# Patient Record
Sex: Male | Born: 1955 | Race: White | Hispanic: No | Marital: Married | Smoking: Never smoker
Health system: Southern US, Community
[De-identification: ages and names within clinical notes are randomized; demographics above are authoritative.]

## PROBLEM LIST (undated history)

## (undated) DIAGNOSIS — M51379 Other intervertebral disc degeneration, lumbosacral region without mention of lumbar back pain or lower extremity pain: Secondary | ICD-10-CM

## (undated) DIAGNOSIS — K219 Gastro-esophageal reflux disease without esophagitis: Secondary | ICD-10-CM

## (undated) DIAGNOSIS — K648 Other hemorrhoids: Secondary | ICD-10-CM

## (undated) DIAGNOSIS — K5792 Diverticulitis of intestine, part unspecified, without perforation or abscess without bleeding: Secondary | ICD-10-CM

## (undated) DIAGNOSIS — N400 Enlarged prostate without lower urinary tract symptoms: Secondary | ICD-10-CM

## (undated) DIAGNOSIS — E785 Hyperlipidemia, unspecified: Secondary | ICD-10-CM

## (undated) DIAGNOSIS — Z8679 Personal history of other diseases of the circulatory system: Secondary | ICD-10-CM

## (undated) DIAGNOSIS — D126 Benign neoplasm of colon, unspecified: Secondary | ICD-10-CM

## (undated) DIAGNOSIS — M5137 Other intervertebral disc degeneration, lumbosacral region: Secondary | ICD-10-CM

## (undated) DIAGNOSIS — I10 Essential (primary) hypertension: Secondary | ICD-10-CM

## (undated) DIAGNOSIS — E559 Vitamin D deficiency, unspecified: Secondary | ICD-10-CM

## (undated) DIAGNOSIS — K4020 Bilateral inguinal hernia, without obstruction or gangrene, not specified as recurrent: Secondary | ICD-10-CM

## (undated) HISTORY — DX: Hyperlipidemia, unspecified: E78.5

## (undated) HISTORY — DX: Other intervertebral disc degeneration, lumbosacral region: M51.37

## (undated) HISTORY — DX: Gastro-esophageal reflux disease without esophagitis: K21.9

## (undated) HISTORY — DX: Other intervertebral disc degeneration, lumbosacral region without mention of lumbar back pain or lower extremity pain: M51.379

## (undated) HISTORY — DX: Diverticulitis of intestine, part unspecified, without perforation or abscess without bleeding: K57.92

## (undated) HISTORY — DX: Vitamin D deficiency, unspecified: E55.9

## (undated) HISTORY — DX: Essential (primary) hypertension: I10

## (undated) HISTORY — DX: Bilateral inguinal hernia, without obstruction or gangrene, not specified as recurrent: K40.20

## (undated) HISTORY — PX: TONSILLECTOMY: SUR1361

## (undated) HISTORY — DX: Benign prostatic hyperplasia without lower urinary tract symptoms: N40.0

## (undated) HISTORY — DX: Benign neoplasm of colon, unspecified: D12.6

## (undated) HISTORY — PX: OTHER SURGICAL HISTORY: SHX169

## (undated) HISTORY — DX: Other hemorrhoids: K64.8

---

## 2001-04-14 ENCOUNTER — Ambulatory Visit (HOSPITAL_COMMUNITY): Admission: RE | Admit: 2001-04-14 | Discharge: 2001-04-14 | Payer: Self-pay | Admitting: Otolaryngology

## 2002-11-29 ENCOUNTER — Emergency Department (HOSPITAL_COMMUNITY): Admission: EM | Admit: 2002-11-29 | Discharge: 2002-11-30 | Payer: Self-pay | Admitting: Emergency Medicine

## 2002-11-30 ENCOUNTER — Encounter: Payer: Self-pay | Admitting: Emergency Medicine

## 2002-12-07 ENCOUNTER — Ambulatory Visit (HOSPITAL_COMMUNITY): Admission: RE | Admit: 2002-12-07 | Discharge: 2002-12-07 | Payer: Self-pay | Admitting: Surgery

## 2002-12-07 ENCOUNTER — Encounter: Payer: Self-pay | Admitting: Surgery

## 2003-01-16 ENCOUNTER — Ambulatory Visit (HOSPITAL_COMMUNITY): Admission: RE | Admit: 2003-01-16 | Discharge: 2003-01-16 | Payer: Self-pay | Admitting: Surgery

## 2003-07-25 ENCOUNTER — Encounter: Admission: RE | Admit: 2003-07-25 | Discharge: 2003-07-25 | Payer: Self-pay | Admitting: Surgery

## 2003-07-31 ENCOUNTER — Ambulatory Visit (HOSPITAL_COMMUNITY): Admission: RE | Admit: 2003-07-31 | Discharge: 2003-07-31 | Payer: Self-pay | Admitting: Gastroenterology

## 2004-01-29 ENCOUNTER — Encounter: Admission: RE | Admit: 2004-01-29 | Discharge: 2004-01-29 | Payer: Self-pay | Admitting: Gastroenterology

## 2005-02-20 ENCOUNTER — Emergency Department (HOSPITAL_COMMUNITY): Admission: EM | Admit: 2005-02-20 | Discharge: 2005-02-21 | Payer: Self-pay | Admitting: Emergency Medicine

## 2009-12-26 ENCOUNTER — Encounter: Payer: Self-pay | Admitting: Internal Medicine

## 2010-03-09 ENCOUNTER — Encounter: Payer: Self-pay | Admitting: Gastroenterology

## 2010-03-21 NOTE — Letter (Signed)
Summary: New Patient letter  [facility]  [address] [place], [place] [address]   Phone: [phone]  Fax: [phone]       12/26/2009 MRN: [id number]  Carl Mccullough [address] [place], [place]  [address]  Dear Carl Mccullough,  Welcome to the Gastroenterology Division at [facility].    You are scheduled to see Dr.  Leone Payor on 02-13-10 at 3pm on the 3rd floor at [facility], [address]  We ask that you try to arrive at our office 15 minutes prior to your appointment time to allow for check-in.  We would like you to complete the enclosed self-administered evaluation form prior to your visit and bring it with you on the day of your appointment.  We will review it with you.  Also, please bring a complete list of all your medications or, if you prefer, bring the medication bottles and we will list them.  Please bring your insurance card so that we may make a copy of it.  If your insurance requires a referral to see a specialist, please bring your referral form from your primary care physician.  Co-payments are due at the time of your visit and may be paid by cash, check or credit card.     Your office visit will consist of a consult with your physician (includes a physical exam), any laboratory testing he/she may order, scheduling of any necessary diagnostic testing (e.g. x-ray, ultrasound, CT-scan), and scheduling of a procedure (e.g. Endoscopy, Colonoscopy) if required.  Please allow enough time on your schedule to allow for any/all of these possibilities.    If you cannot keep your appointment, please call [phone] to cancel or reschedule prior to your appointment date.  This allows [place] the opportunity to schedule an appointment for another patient in need of care.  If you do not cancel or reschedule by 5 p.m. the business day prior to your appointment date, you will be charged a $50.00 late cancellation/no-show fee.    Thank you for choosing [facility] for your medical needs.  We appreciate the opportunity to care for you.  Please visit [place] at our website  to learn more about our practice.                     Sincerely,                                                             The Gastroenterology Division

## 2012-02-16 ENCOUNTER — Other Ambulatory Visit: Payer: Self-pay | Admitting: Family Medicine

## 2012-02-16 DIAGNOSIS — R1030 Lower abdominal pain, unspecified: Secondary | ICD-10-CM

## 2012-02-16 DIAGNOSIS — R1013 Epigastric pain: Secondary | ICD-10-CM

## 2012-02-17 ENCOUNTER — Ambulatory Visit (HOSPITAL_COMMUNITY): Payer: BC Managed Care – PPO

## 2012-02-19 ENCOUNTER — Other Ambulatory Visit: Payer: Self-pay | Admitting: Family Medicine

## 2012-02-19 ENCOUNTER — Ambulatory Visit (HOSPITAL_COMMUNITY)
Admission: RE | Admit: 2012-02-19 | Discharge: 2012-02-19 | Disposition: A | Discharge status: Home / Self Care | Payer: BC Managed Care – PPO | Source: Ambulatory Visit | Attending: Family Medicine | Admitting: Family Medicine

## 2012-02-19 DIAGNOSIS — R1013 Epigastric pain: Secondary | ICD-10-CM

## 2012-02-19 DIAGNOSIS — K824 Cholesterolosis of gallbladder: Secondary | ICD-10-CM | POA: Insufficient documentation

## 2012-02-19 DIAGNOSIS — R1030 Lower abdominal pain, unspecified: Secondary | ICD-10-CM

## 2012-02-19 DIAGNOSIS — R109 Unspecified abdominal pain: Secondary | ICD-10-CM | POA: Insufficient documentation

## 2012-03-02 ENCOUNTER — Other Ambulatory Visit: Payer: Self-pay | Admitting: Gastroenterology

## 2012-04-17 HISTORY — PX: CYSTOSCOPY: SUR368

## 2012-05-14 ENCOUNTER — Ambulatory Visit (INDEPENDENT_AMBULATORY_CARE_PROVIDER_SITE_OTHER): Payer: BC Managed Care – PPO | Admitting: Urology

## 2012-05-14 DIAGNOSIS — N529 Male erectile dysfunction, unspecified: Secondary | ICD-10-CM

## 2012-05-14 DIAGNOSIS — R310 Gross hematuria: Secondary | ICD-10-CM

## 2012-06-17 ENCOUNTER — Ambulatory Visit (INDEPENDENT_AMBULATORY_CARE_PROVIDER_SITE_OTHER): Payer: BC Managed Care – PPO | Admitting: Family Medicine

## 2012-06-17 ENCOUNTER — Encounter: Payer: Self-pay | Admitting: Family Medicine

## 2012-06-17 VITALS — BP 127/81 | HR 80 | Ht 70.0 in | Wt 179.8 lb

## 2012-06-17 DIAGNOSIS — E349 Endocrine disorder, unspecified: Secondary | ICD-10-CM

## 2012-06-17 DIAGNOSIS — L989 Disorder of the skin and subcutaneous tissue, unspecified: Secondary | ICD-10-CM

## 2012-06-17 DIAGNOSIS — E291 Testicular hypofunction: Secondary | ICD-10-CM

## 2012-06-17 DIAGNOSIS — E559 Vitamin D deficiency, unspecified: Secondary | ICD-10-CM

## 2012-06-17 DIAGNOSIS — W57XXXA Bitten or stung by nonvenomous insect and other nonvenomous arthropods, initial encounter: Secondary | ICD-10-CM

## 2012-06-17 DIAGNOSIS — I10 Essential (primary) hypertension: Secondary | ICD-10-CM | POA: Insufficient documentation

## 2012-06-17 DIAGNOSIS — E785 Hyperlipidemia, unspecified: Secondary | ICD-10-CM | POA: Insufficient documentation

## 2012-06-17 LAB — POCT CBC
MCH, POC: 31.7 pg — AB (ref 27–31.2)
MCHC: 33.1 g/dL (ref 31.8–35.4)
MCV: 96 fL (ref 80–97)
MPV: 8.8 fL (ref 0–99.8)
POC Granulocyte: 2.8 (ref 2–6.9)
Platelet Count, POC: 219 10*3/uL (ref 142–424)
RDW, POC: 12.9 %
WBC: 5 10*3/uL (ref 4.6–10.2)

## 2012-06-17 LAB — BASIC METABOLIC PANEL WITH GFR
CO2: 26 mEq/L (ref 19–32)
Calcium: 9.8 mg/dL (ref 8.4–10.5)
Chloride: 101 mEq/L (ref 96–112)
Creat: 0.98 mg/dL (ref 0.50–1.35)
GFR, Est African American: >89 mL/min

## 2012-06-17 LAB — HEPATIC FUNCTION PANEL
Bilirubin, Direct: 0.1 mg/dL (ref 0.0–0.3)
Indirect Bilirubin: 0.5 mg/dL (ref 0.0–0.9)
Total Bilirubin: 0.6 mg/dL (ref 0.3–1.2)

## 2012-06-17 MED ORDER — DOXYCYCLINE HYCLATE 100 MG PO TABS: 100.0000 mg | ORAL_TABLET | Freq: Two times a day (BID) | ORAL | Status: DC

## 2012-06-17 NOTE — Addendum Note (Signed)
Addended by: Bearl Mulberry on: 06/17/2012 09:08 AM   Modules accepted: Orders

## 2012-06-17 NOTE — Addendum Note (Signed)
Addended by: Lisbeth Ply C on: 06/17/2012 10:52 AM   Modules accepted: Orders

## 2012-06-17 NOTE — Addendum Note (Signed)
Addended by: Bearl Mulberry on: 06/17/2012 10:49 AM   Modules accepted: Orders

## 2012-06-17 NOTE — Progress Notes (Signed)
Subjective:    Patient ID: Carl Mccullough, male    DOB: [birth year], 57 y.o.   MRN: [id number]  HPI This patient presents for recheck of multiple medical problems. No one accompanies the patient today.  Patient Active Problem List   Diagnosis Date Noted  . Hypertension 06/17/2012  . Hyperlipidemia 06/17/2012  . Testosterone deficiency 06/17/2012    In addition, he has a history of frequent tick bites because of working in the yard a lot.  The allergies, current medications, past medical history, surgical history, family and social history are reviewed.  Immunizations reviewed.  Health maintenance reviewed.  The following items are outstanding: None       Review of Systems  Constitutional: Negative.   HENT: Positive for hearing loss, sore throat, trouble swallowing (occasional trouble swallowing but recent endoscopy negative) and tinnitus. Negative for congestion.        Patient knows a sore throat off and on has had this for several months. It is not getting worse but the frequency still keeps bothering. He had a recent endoscopy that was negative  Eyes: Negative for pain, discharge, redness and visual disturbance.       Recent eye exam was negative he did get a set of new glasses.  Respiratory: Negative.   Cardiovascular: Negative.   Gastrointestinal: Negative.  Negative for vomiting, diarrhea, constipation and rectal pain.       As noted recent endoscopy and colonoscopy in January that were both negative except for one polyp that was found. Patient says he needs another colonoscopy in 5 year. Dr. Madilyn Fireman his gastroenterologist  Genitourinary: Negative.   Musculoskeletal: Negative.   Skin: Positive for rash.  Allergic/Immunologic: Negative.   Neurological: Negative.   Hematological: Negative.   Psychiatric/Behavioral: Negative.        Objective:   Physical Exam  Nursing note and vitals reviewed. Constitutional: He is oriented to person, place, and time. He appears  well-developed and well-nourished. No distress.  HENT:  Head: Normocephalic and atraumatic.  Right Ear: External ear normal.  Left Ear: External ear normal.  Nose: Nose normal.  Mouth/Throat: Oropharynx is clear and moist. No oropharyngeal exudate.  Eyes: Conjunctivae are normal.  Neck: Normal range of motion. Neck supple. No thyromegaly present.  Cardiovascular: Normal rate, regular rhythm and intact distal pulses.  Exam reveals gallop. Exam reveals no friction rub.   No murmur heard. Pulmonary/Chest: Effort normal and breath sounds normal. No respiratory distress. He has no wheezes. He has no rales.  Abdominal: Soft. Bowel sounds are normal. He exhibits no mass. There is no tenderness. There is no rebound and no guarding.  Musculoskeletal: Normal range of motion.  Lymphadenopathy:    He has no cervical adenopathy.  Neurological: He is alert and oriented to person, place, and time. He has normal reflexes.  Skin: Skin is warm and dry.  Nail head skin lesion left lateral orbit. Could be a BCC. Multiple tick bites trauma legs arms.  Psychiatric: He has a normal mood and affect. His behavior is normal. Judgment and thought content normal.          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 They thrive in wooded areas. Deer are the preferred host of adult deer ticks.  Small rodents are the host of young ticks (nymphs). When a person walks in a field or wooded area, young and adult ticks in the surrounding grass and vegetation can attach themselves to the skin. They can suck blood for hours to days if unnoticed. Ticks are found all over the [place] Some ticks carry a specific bacteria (Borrelia burgdorferi) that causes an infection called Lyme disease. The bacteria is typically passed into a person during the blood sucking process. This happens after the tick has been attached for at least a number of hours. While ticks can be found all over the [place] those carrying the bacteria that causes Lyme disease are most common in [place] and the [place]. Only a small proportion of ticks in these areas carry the Lyme disease bacteria and cause human infections. Ticks usually attach to warm spots on the body, such as the:  Head.  Back.  Neck.  Armpits.  Groin. SYMPTOMS  Most of the time, a deer tick bite will not be felt. You may or may not see the attached tick. You may notice mild irritation or redness around the bite site. If the deer tick passes the Lyme disease bacteria to a person, a round, red rash may be noticed 2 to 3 days after the bite. The rash may be clear in the middle, like a bull's-eye or target. If not treated, other symptoms may develop several days to weeks after the onset of the rash. These symptoms may include:  New rash lesions.  Fatigue and weakness.  General ill feeling and achiness.  Chills.  Headache and neck pain.  Swollen lymph glands.  Sore muscles and joints. 5 to 15% of untreated people with Lyme disease may develop more severe illnesses after several weeks to months. This may include inflammation of the brain lining (meningitis), nerve palsies, an abnormal heartbeat, or severe muscle and joint pain and inflammation (myositis or arthritis). DIAGNOSIS   Physical exam and medical history.  Viewing the tick if it was saved for  confirmation.  Blood tests (to check or confirm the presence of Lyme disease). TREATMENT  Most ticks do not carry disease. If found, an attached tick should be removed using tweezers. Tweezers should be placed under the body of the tick so it is removed by its attachment parts (pincers). If there are signs or symptoms of being sick, or Lyme disease is confirmed, medicines (antibiotics) that kill germs are usually prescribed. In more severe cases, antibiotics may be given through an intravenous (IV) access. HOME CARE INSTRUCTIONS   Always remove ticks with tweezers. Do not use petroleum jelly or other methods to kill or remove the tick. Slide the tweezers under the body and pull out as much as you can. If you are not sure what it is, save it in a jar and show your caregiver.  Once you remove the tick, the skin will heal on its own. Wash your hands and the affected area with water and soap. You may place a bandage on the affected area.  Take medicine as directed. You may be advised to  take a full course of antibiotics.  Follow up with your caregiver as recommended. FINDING OUT THE RESULTS OF YOUR TEST Not all test results are available during your visit. If your test results are not back during the visit, make an appointment with your caregiver to find out the results. Do not assume everything is normal if you have not heard from your caregiver or the medical facility. It is important for you to follow up on all of your test results. PROGNOSIS  If Lyme disease is confirmed, early treatment with antibiotics is very effective. Following preventive guidelines is important since it is possible to get the disease more than once. PREVENTION   Wear long sleeves and long pants in wooded or grassy areas. Tuck your pants into your socks.  Use an insect repellent while hiking.  Check yourself, your children, and your pets regularly for ticks after playing outside.  Clear piles of leaves or brush from  your yard. Ticks might live there. SEEK MEDICAL CARE IF:   You or your child has an oral temperature above 102 F (38.9 C).  You develop a severe headache following the bite.  You feel generally ill.  You notice a rash.  You are having trouble removing the tick.  The bite area has red skin or yellow drainage. SEEK IMMEDIATE MEDICAL CARE IF:   Your face is weak and droopy or you have other neurological symptoms.  You have severe joint pain or weakness. MAKE SURE YOU:   Understand these instructions.  Will watch your condition.  Will get help right away if you are not doing well or get worse. FOR MORE INFORMATION Centers for Disease Control and Prevention: FootballExhibition.com.br American Academy of Family Physicians: www.https://powers.com/ Document Released: 04/30/2009 Document Revised: 04/28/2011 Document Reviewed: 04/30/2009 [facility] Patient Information 2013 [place], [place].

## 2012-06-17 NOTE — Patient Instructions (Addendum)
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 They thrive in wooded areas. Deer are the preferred host of adult deer ticks. Small rodents are the host of young ticks (nymphs). When a person walks in a field or wooded area, young and adult ticks in the surrounding grass and vegetation can attach themselves to the skin. They can suck blood for hours to days if unnoticed. Ticks are found all over the [place] Some ticks carry a specific bacteria (Borrelia burgdorferi) that causes an infection called Lyme disease. The bacteria is typically passed into a person during the blood sucking process. This happens after the tick has been attached for at least a number of hours. While ticks can be found all over the [place] those carrying the bacteria that causes Lyme disease are most common in [place] and the [place]. Only a small proportion of ticks in these areas carry the Lyme disease bacteria and cause human infections. Ticks usually attach to warm spots on the body, such as the:  Head.  Back.  Neck.  Armpits.  Groin. SYMPTOMS  Most of the time, a deer tick bite will not be felt. You may or may not see the attached tick. You may notice mild irritation or redness around the bite site. If the deer tick passes the Lyme disease bacteria to a person, a round, red rash may be noticed 2 to 3 days after the bite. The rash may be clear in the middle, like a bull's-eye or target. If not treated, other symptoms may  develop several days to weeks after the onset of the rash. These symptoms may include:  New rash lesions.  Fatigue and weakness.  General ill feeling and achiness.  Chills.  Headache and neck pain.  Swollen lymph glands.  Sore muscles and joints. 5 to 15% of untreated people with Lyme disease may develop more severe illnesses after several weeks to months. This may include inflammation of the brain lining (meningitis), nerve palsies, an abnormal heartbeat, or severe muscle and joint pain and inflammation (myositis or arthritis). DIAGNOSIS   Physical exam and medical history.  Viewing the tick if it was saved for confirmation.  Blood tests (to check or confirm the presence of Lyme disease). TREATMENT  Most ticks do not carry disease. If found, an attached tick should be removed using tweezers. Tweezers should be placed under the body of the tick so it is removed by its attachment parts (pincers). If there are signs or symptoms of being sick, or Lyme disease is confirmed, medicines (antibiotics) that kill germs are usually prescribed. In more severe cases, antibiotics may be given through an intravenous (IV) access. HOME CARE INSTRUCTIONS   Always remove ticks with tweezers. Do not use petroleum jelly or other methods to kill or remove the tick. Slide the tweezers under the body and pull out as much as you can. If you are not sure what it is, save it in a  jar and show your caregiver.  Once you remove the tick, the skin will heal on its own. Wash your hands and the affected area with water and soap. You may place a bandage on the affected area.  Take medicine as directed. You may be advised to take a full course of antibiotics.  Follow up with your caregiver as recommended. FINDING OUT THE RESULTS OF YOUR TEST Not all test results are available during your visit. If your test results are not back during the visit, make an appointment with your caregiver to find out the results. Do  not assume everything is normal if you have not heard from your caregiver or the medical facility. It is important for you to follow up on all of your test results. PROGNOSIS  If Lyme disease is confirmed, early treatment with antibiotics is very effective. Following preventive guidelines is important since it is possible to get the disease more than once. PREVENTION   Wear long sleeves and long pants in wooded or grassy areas. Tuck your pants into your socks.  Use an insect repellent while hiking.  Check yourself, your children, and your pets regularly for ticks after playing outside.  Clear piles of leaves or brush from your yard. Ticks might live there. SEEK MEDICAL CARE IF:   You or your child has an oral temperature above 102 F (38.9 C).  You develop a severe headache following the bite.  You feel generally ill.  You notice a rash.  You are having trouble removing the tick.  The bite area has red skin or yellow drainage. SEEK IMMEDIATE MEDICAL CARE IF:   Your face is weak and droopy or you have other neurological symptoms.  You have severe joint pain or weakness. MAKE SURE YOU:   Understand these instructions.  Will watch your condition.  Will get help right away if you are not doing well or get worse. FOR MORE INFORMATION Centers for Disease Control and Prevention: FootballExhibition.com.br American Academy of Family Physicians: www.https://powers.com/ Document Released: 04/30/2009 Document Revised: 04/28/2011 Document Reviewed: 04/30/2009 [facility] Patient Information 2013 [place], [place].

## 2012-06-18 ENCOUNTER — Other Ambulatory Visit: Payer: Self-pay | Admitting: Family Medicine

## 2012-06-18 LAB — NMR LIPOPROFILE WITH LIPIDS
HDL Particle Number: 40.2 umol/L (ref >=30.5)
HDL-C: 72 mg/dL (ref >=40)
LDL Particle Number: 855 nmol/L (ref <1000)
LDL Size: 21 nm (ref >20.5)
LP-IR Score: <25 (ref <=45)
Large HDL-P: 11.3 umol/L (ref >=4.8)

## 2012-06-18 LAB — VITAMIN D 25 HYDROXY (VIT D DEFICIENCY, FRACTURES): Vit D, 25-Hydroxy: 24 ng/mL — ABNORMAL LOW (ref 30–89)

## 2012-06-18 NOTE — Addendum Note (Signed)
Addended byDory Peru on: 06/18/2012 03:11 PM   Modules accepted: Orders

## 2012-06-23 ENCOUNTER — Other Ambulatory Visit: Payer: Self-pay | Admitting: *Deleted

## 2012-06-23 DIAGNOSIS — E559 Vitamin D deficiency, unspecified: Secondary | ICD-10-CM

## 2012-06-23 MED ORDER — CHOLECALCIFEROL 1.25 MG (50000 UT) PO TABS: 1.0000 | ORAL_TABLET | ORAL | Status: DC

## 2012-06-25 ENCOUNTER — Other Ambulatory Visit: Payer: Self-pay | Admitting: *Deleted

## 2012-07-08 ENCOUNTER — Telehealth: Payer: Self-pay | Admitting: Family Medicine

## 2012-07-08 NOTE — Telephone Encounter (Signed)
Pt advised about labs per Carl Mccullough. On 5/22- pm

## 2012-07-19 ENCOUNTER — Telehealth: Payer: Self-pay

## 2012-07-19 NOTE — Telephone Encounter (Signed)
Patient cancelled his appt for 07/20/12 with dermatology

## 2012-12-03 ENCOUNTER — Other Ambulatory Visit: Payer: Self-pay | Admitting: *Deleted

## 2012-12-03 MED ORDER — METRONIDAZOLE 500 MG PO TABS: 500.0000 mg | ORAL_TABLET | Freq: Three times a day (TID) | ORAL | Status: DC

## 2012-12-03 MED ORDER — CIPROFLOXACIN HCL 500 MG PO TABS: 500.0000 mg | ORAL_TABLET | Freq: Two times a day (BID) | ORAL | Status: DC

## 2012-12-22 ENCOUNTER — Other Ambulatory Visit: Payer: Self-pay | Admitting: Family Medicine

## 2013-01-03 ENCOUNTER — Ambulatory Visit (INDEPENDENT_AMBULATORY_CARE_PROVIDER_SITE_OTHER): Payer: BC Managed Care – PPO | Admitting: Family Medicine

## 2013-01-03 ENCOUNTER — Encounter: Payer: Self-pay | Admitting: Family Medicine

## 2013-01-03 VITALS — BP 141/84 | HR 78 | Temp 97.1°F | Ht 69.0 in | Wt 182.0 lb

## 2013-01-03 DIAGNOSIS — E785 Hyperlipidemia, unspecified: Secondary | ICD-10-CM

## 2013-01-03 DIAGNOSIS — K5730 Diverticulosis of large intestine without perforation or abscess without bleeding: Secondary | ICD-10-CM | POA: Insufficient documentation

## 2013-01-03 DIAGNOSIS — E349 Endocrine disorder, unspecified: Secondary | ICD-10-CM

## 2013-01-03 DIAGNOSIS — N529 Male erectile dysfunction, unspecified: Secondary | ICD-10-CM | POA: Insufficient documentation

## 2013-01-03 DIAGNOSIS — E559 Vitamin D deficiency, unspecified: Secondary | ICD-10-CM

## 2013-01-03 DIAGNOSIS — Z23 Encounter for immunization: Secondary | ICD-10-CM

## 2013-01-03 DIAGNOSIS — J309 Allergic rhinitis, unspecified: Secondary | ICD-10-CM | POA: Insufficient documentation

## 2013-01-03 DIAGNOSIS — W57XXXA Bitten or stung by nonvenomous insect and other nonvenomous arthropods, initial encounter: Secondary | ICD-10-CM

## 2013-01-03 DIAGNOSIS — E291 Testicular hypofunction: Secondary | ICD-10-CM

## 2013-01-03 DIAGNOSIS — I10 Essential (primary) hypertension: Secondary | ICD-10-CM

## 2013-01-03 LAB — POCT CBC
Granulocyte percent: 59.7 %G (ref 37–80)
HCT, POC: 52.7 % (ref 43.5–53.7)
Hemoglobin: 16.8 g/dL (ref 14.1–18.1)
MCH, POC: 27.6 pg (ref 27–31.2)
MCV: 86.9 fL (ref 80–97)
POC Granulocyte: 2.8 (ref 2–6.9)
POC LYMPH PERCENT: 35.7 %L (ref 10–50)
RBC: 6.1 M/uL (ref 4.69–6.13)
WBC: 4.7 10*3/uL (ref 4.6–10.2)

## 2013-01-03 LAB — BASIC METABOLIC PANEL WITH GFR
Calcium: 9.4 mg/dL (ref 8.4–10.5)
Creat: 0.92 mg/dL (ref 0.50–1.35)
GFR, Est Non African American: >89 mL/min
Glucose, Bld: 90 mg/dL (ref 70–99)
Sodium: 137 mEq/L (ref 135–145)

## 2013-01-03 LAB — HEPATIC FUNCTION PANEL
ALT: 18 U/L (ref 0–53)
Alkaline Phosphatase: 84 U/L (ref 39–117)
Bilirubin, Direct: 0.1 mg/dL (ref 0.0–0.3)
Total Bilirubin: 0.4 mg/dL (ref 0.3–1.2)
Total Protein: 7.2 g/dL (ref 6.0–8.3)

## 2013-01-03 MED ORDER — SILDENAFIL CITRATE 100 MG PO TABS: 50.0000 mg | ORAL_TABLET | Freq: Every day | ORAL | Status: DC | PRN

## 2013-01-03 MED ORDER — FLUTICASONE PROPIONATE 50 MCG/ACT NA SUSP: NASAL | Status: DC

## 2013-01-03 NOTE — Progress Notes (Signed)
Subjective:    Patient ID: Carl Mccullough, male    DOB: June 16, 1955, 57 y.o.   MRN: [id number]  HPI Pt here for follow up and management of chronic medical problems. As of note the patient had a recent bout of diverticulitis when he was on vacation and a prescription for ciprofloxacin and metronidazole was called in for him. This was treated adequately and his abdominal pain is resolved at this point in time.    Review of Systems  Constitutional: Positive for fatigue (slight). Negative for activity change.  HENT: Positive for congestion (slight nasal). Negative for ear pain, sinus pressure and sore throat.   Eyes: Negative for pain, redness and itching.  Respiratory: Negative for cough, shortness of breath and wheezing.   Cardiovascular: Negative for chest pain, palpitations and leg swelling.  Gastrointestinal: Negative for abdominal pain, diarrhea and constipation.  Endocrine: Negative.   Genitourinary: Negative for dysuria, urgency and frequency.  Musculoskeletal: Negative for arthralgias and back pain.  Allergic/Immunologic: Negative for environmental allergies.  Neurological: Negative for dizziness, light-headedness and headaches.  Psychiatric/Behavioral: Negative for confusion, sleep disturbance and decreased concentration.       Objective:   Physical Exam  Nursing note and vitals reviewed. Constitutional: He is oriented to person, place, and time. He appears well-developed and well-nourished.  HENT:  Head: Normocephalic and atraumatic.  Right Ear: External ear normal.  Left Ear: External ear normal.  Nose: Nose normal.  Mouth/Throat: Oropharynx is clear and moist. No oropharyngeal exudate.  Minimal nasal congestion bilateral  Eyes: Conjunctivae and EOM are normal. Pupils are equal, round, and reactive to light. Right eye exhibits no discharge. Left eye exhibits no discharge. No scleral icterus.  Neck: Normal range of motion. Neck supple. No tracheal deviation present. No  thyromegaly present.  No bruits in the neck  Cardiovascular: Normal rate, regular rhythm, normal heart sounds and intact distal pulses.  Exam reveals no gallop and no friction rub.   No murmur heard. At 72 per minute  Pulmonary/Chest: Effort normal and breath sounds normal. No respiratory distress. He has no wheezes. He has no rales. He exhibits no tenderness.  Abdominal: Soft. Bowel sounds are normal. He exhibits no distension and no mass. There is no tenderness. There is no rebound and no guarding.  Musculoskeletal: Normal range of motion. He exhibits no edema and no tenderness.  Lymphadenopathy:    He has no cervical adenopathy.  Neurological: He is alert and oriented to person, place, and time. He has normal reflexes. No cranial nerve deficit.  Skin: Skin is warm and dry. No rash noted. No erythema. No pallor.  Fatty tumor posterior thoracic area  Psychiatric: He has a normal mood and affect. His behavior is normal. Judgment and thought content normal.          Assessment & Plan:   1. Hypertension   2. Hyperlipidemia   3. Diverticulosis of colon without hemorrhage   4. Erectile dysfunction   5. Allergic rhinitis    No orders of the defined types were placed in this encounter.   Meds ordered this encounter  Medications  . fluticasone (FLONASE) 50 MCG/ACT nasal spray    Sig: 1-2 sprays in each nostril at bedtime    Dispense:  16 g    Refill:  6  . sildenafil (VIAGRA) 100 MG tablet    Sig: Take 0.5-1 tablets (50-100 mg total) by mouth daily as needed for erectile dysfunction.    Dispense:  5 tablet    Refill:  11   Patient Instructions  Continue aggressive therapeutic lifestyle changes Continue to monitor blood pressure at home Always be careful and do not put yourself at risk for falling Drink plenty of fluids Watch sodium in your diet Use a cool mist humidifier at nighttime Use nose spray as directed Be sure to check with your insurance regarding the cost for the  shingles shot or zoster that. Also check with your insurance regarding Prevnar.   Nyra Capes MD

## 2013-01-03 NOTE — Patient Instructions (Addendum)
Continue aggressive therapeutic lifestyle changes Continue to monitor blood pressure at home Always be careful and do not put yourself at risk for falling Drink plenty of fluids Watch sodium in your diet Use a cool mist humidifier at nighttime Use nose spray as directed Be sure to check with your insurance regarding the cost for the shingles shot or zoster that. Also check with your insurance regarding Prevnar.Influenza Virus Vaccine injection What is this medicine? INFLUENZA VIRUS VACCINE (in floo EN zuh VAHY ruhs vak SEEN) helps to reduce the risk of getting influenza also known as the flu. The vaccine only helps protect you against some strains of the flu. This medicine may be used for other purposes; ask your health care provider or pharmacist if you have questions. COMMON BRAND NAME(S): Afluria , Agriflu, Fluarix Quadrivalent, Fluarix, FLUCELVAX, Flulaval, Fluvirin, Fluzone High-Dose, Fluzone Intradermal, Fluzone What should I tell my health care provider before I take this medicine? They need to know if you have any of these conditions: -bleeding disorder like hemophilia -fever or infection -Guillain-Barre syndrome or other neurological problems -immune system problems -infection with the human immunodeficiency virus (HIV) or AIDS -low blood platelet counts -multiple sclerosis -an unusual or allergic reaction to influenza virus vaccine, latex, other medicines, foods, dyes, or preservatives. Different brands of vaccines contain different allergens. Some may contain latex or eggs. Talk to your doctor about your allergies to make sure that you get the right vaccine. -pregnant or trying to get pregnant -breast-feeding How should I use this medicine? This vaccine is for injection into a muscle or under the skin. It is given by a health care professional. A copy of Vaccine Information Statements will be given before each vaccination. Read this sheet carefully each time. The sheet may change  frequently. Talk to your healthcare provider to see which vaccines are right for you. Some vaccines should not be used in all age groups. Overdosage: If you think you have taken too much of this medicine contact a poison control center or emergency room at once. NOTE: This medicine is only for you. Do not share this medicine with others. What if I miss a dose? This does not apply. What may interact with this medicine? -chemotherapy or radiation therapy -medicines that lower your immune system like etanercept, anakinra, infliximab, and adalimumab -medicines that treat or prevent blood clots like warfarin -phenytoin -steroid medicines like prednisone or cortisone -theophylline -vaccines This list may not describe all possible interactions. Give your health care provider a list of all the medicines, herbs, non-prescription drugs, or dietary supplements you use. Also tell them if you smoke, drink alcohol, or use illegal drugs. Some items may interact with your medicine. What should I watch for while using this medicine? Report any side effects that do not go away within 3 days to your doctor or health care professional. Call your health care provider if any unusual symptoms occur within 6 weeks of receiving this vaccine. You may still catch the flu, but the illness is not usually as bad. You cannot get the flu from the vaccine. The vaccine will not protect against colds or other illnesses that may cause fever. The vaccine is needed every year. What side effects may I notice from receiving this medicine? Side effects that you should report to your doctor or health care professional as soon as possible: -allergic reactions like skin rash, itching or hives, swelling of the face, lips, or tongue Side effects that usually do not require medical attention (report to  your doctor or health care professional if they continue or are bothersome): -fever -headache -muscle aches and pains -pain, tenderness,  redness, or swelling at the injection site -tiredness This list may not describe all possible side effects. Call your doctor for medical advice about side effects. You may report side effects to FDA at 1-800-FDA-1088. Where should I keep my medicine? The vaccine will be given by a health care professional in a clinic, pharmacy, doctor's office, or other health care setting. You will not be given vaccine doses to store at home. NOTE: This sheet is a summary. It may not cover all possible information. If you have questions about this medicine, talk to your doctor, pharmacist, or health care provider.  2014, Elsevier/Gold Standard. (2011-08-14 16:10:96)

## 2013-01-04 LAB — NMR LIPOPROFILE WITH LIPIDS
Cholesterol, Total: 170 mg/dL (ref <200)
HDL Particle Number: 41.1 umol/L (ref >=30.5)
HDL-C: 65 mg/dL (ref >=40)
LDL Particle Number: 1134 nmol/L — ABNORMAL HIGH (ref <1000)
LP-IR Score: 27 (ref <=45)
Small LDL Particle Number: 576 nmol/L — ABNORMAL HIGH (ref <=527)
VLDL Size: 39.2 nm (ref <=46.6)

## 2013-01-04 LAB — VITAMIN D 25 HYDROXY (VIT D DEFICIENCY, FRACTURES): Vit D, 25-Hydroxy: 28 ng/mL — ABNORMAL LOW (ref 30–89)

## 2013-04-06 ENCOUNTER — Other Ambulatory Visit: Payer: Self-pay | Admitting: *Deleted

## 2013-04-06 MED ORDER — HYDROCOD POLST-CHLORPHEN POLST 10-8 MG/5ML PO LQCR: 5.0000 mL | Freq: Every evening | ORAL | Status: DC | PRN

## 2013-05-11 ENCOUNTER — Other Ambulatory Visit: Payer: Self-pay | Admitting: *Deleted

## 2013-05-11 DIAGNOSIS — L989 Disorder of the skin and subcutaneous tissue, unspecified: Secondary | ICD-10-CM

## 2013-05-26 ENCOUNTER — Other Ambulatory Visit: Payer: Self-pay

## 2013-06-01 ENCOUNTER — Other Ambulatory Visit: Payer: Self-pay | Admitting: *Deleted

## 2013-06-01 MED ORDER — PREDNISONE (PAK) 10 MG PO TABS: ORAL_TABLET | Freq: Every day | ORAL | Status: DC

## 2013-06-01 NOTE — Progress Notes (Signed)
Pt has contact dermatitis on face and groin RX sent into [facility]

## 2013-07-07 ENCOUNTER — Ambulatory Visit: Payer: BC Managed Care – PPO | Admitting: Family Medicine

## 2013-09-22 ENCOUNTER — Telehealth: Payer: Self-pay | Admitting: Family Medicine

## 2013-09-22 ENCOUNTER — Telehealth: Payer: Self-pay | Admitting: *Deleted

## 2013-09-22 DIAGNOSIS — E349 Endocrine disorder, unspecified: Secondary | ICD-10-CM

## 2013-09-22 DIAGNOSIS — N400 Enlarged prostate without lower urinary tract symptoms: Secondary | ICD-10-CM

## 2013-09-22 DIAGNOSIS — E559 Vitamin D deficiency, unspecified: Secondary | ICD-10-CM

## 2013-09-22 DIAGNOSIS — I10 Essential (primary) hypertension: Secondary | ICD-10-CM

## 2013-09-22 DIAGNOSIS — E785 Hyperlipidemia, unspecified: Secondary | ICD-10-CM

## 2013-09-22 NOTE — Telephone Encounter (Signed)
appt coming up - coming for labs few days prior Ordered.

## 2013-09-22 NOTE — Telephone Encounter (Signed)
Scheduled

## 2013-09-29 ENCOUNTER — Other Ambulatory Visit (INDEPENDENT_AMBULATORY_CARE_PROVIDER_SITE_OTHER): Payer: BC Managed Care – PPO

## 2013-09-29 DIAGNOSIS — I10 Essential (primary) hypertension: Secondary | ICD-10-CM

## 2013-09-29 DIAGNOSIS — N400 Enlarged prostate without lower urinary tract symptoms: Secondary | ICD-10-CM

## 2013-09-29 DIAGNOSIS — E559 Vitamin D deficiency, unspecified: Secondary | ICD-10-CM

## 2013-09-29 DIAGNOSIS — E785 Hyperlipidemia, unspecified: Secondary | ICD-10-CM

## 2013-09-29 DIAGNOSIS — E349 Endocrine disorder, unspecified: Secondary | ICD-10-CM

## 2013-09-29 DIAGNOSIS — E291 Testicular hypofunction: Secondary | ICD-10-CM

## 2013-09-29 LAB — POCT URINALYSIS DIPSTICK
Bilirubin, UA: NEGATIVE
Glucose, UA: NEGATIVE
KETONES UA: NEGATIVE
Leukocytes, UA: NEGATIVE
Nitrite, UA: NEGATIVE
PH UA: 7
Protein, UA: NEGATIVE
SPEC GRAV UA: 1.01
UROBILINOGEN UA: NEGATIVE

## 2013-09-29 LAB — POCT CBC
Granulocyte percent: 55 %G (ref 37–80)
HEMATOCRIT: 47.2 % (ref 43.5–53.7)
HEMOGLOBIN: 15.7 g/dL (ref 14.1–18.1)
Lymph, poc: 2.2 (ref 0.6–3.4)
MCH, POC: 32.1 pg — AB (ref 27–31.2)
MCHC: 33.4 g/dL (ref 31.8–35.4)
MCV: 96.2 fL (ref 80–97)
MPV: 7.5 fL (ref 0–99.8)
POC Granulocyte: 2.8 (ref 2–6.9)
POC LYMPH PERCENT: 42.3 %L (ref 10–50)
Platelet Count, POC: 246 10*3/uL (ref 142–424)
RBC: 4.9 M/uL (ref 4.69–6.13)
RDW, POC: 12.9 %
WBC: 5.1 10*3/uL (ref 4.6–10.2)

## 2013-09-29 LAB — POCT UA - MICROSCOPIC ONLY
BACTERIA, U MICROSCOPIC: NEGATIVE
CASTS, UR, LPF, POC: NEGATIVE
Crystals, Ur, HPF, POC: NEGATIVE
Mucus, UA: NEGATIVE
WBC, Ur, HPF, POC: NEGATIVE
Yeast, UA: NEGATIVE

## 2013-09-29 NOTE — Progress Notes (Signed)
Pt came in for lab  only 

## 2013-09-30 ENCOUNTER — Telehealth: Payer: Self-pay | Admitting: Family Medicine

## 2013-09-30 LAB — NMR, LIPOPROFILE
CHOLESTEROL: 174 mg/dL (ref 100–199)
HDL Cholesterol by NMR: 72 mg/dL (ref >39)
HDL Particle Number: 42.3 umol/L (ref >=30.5)
LDL Particle Number: 805 nmol/L (ref <1000)
LDL SIZE: 20.8 nm (ref >20.5)
LDLC SERPL CALC-MCNC: 89 mg/dL (ref 0–99)
LP-IR SCORE: 30 (ref <=45)
SMALL LDL PARTICLE NUMBER: 497 nmol/L (ref <=527)
TRIGLYCERIDES BY NMR: 67 mg/dL (ref 0–149)

## 2013-09-30 LAB — HEPATIC FUNCTION PANEL
ALT: 17 IU/L (ref 0–44)
AST: 19 IU/L (ref 0–40)
Albumin: 4.4 g/dL (ref 3.5–5.5)
Alkaline Phosphatase: 87 IU/L (ref 39–117)
BILIRUBIN DIRECT: 0.11 mg/dL (ref 0.00–0.40)
TOTAL PROTEIN: 7 g/dL (ref 6.0–8.5)
Total Bilirubin: 0.4 mg/dL (ref 0.0–1.2)

## 2013-09-30 LAB — BMP8+EGFR
BUN/Creatinine Ratio: 10 (ref 9–20)
BUN: 11 mg/dL (ref 6–24)
CO2: 26 mmol/L (ref 18–29)
CREATININE: 1.07 mg/dL (ref 0.76–1.27)
Calcium: 9.7 mg/dL (ref 8.7–10.2)
Chloride: 98 mmol/L (ref 97–108)
GFR calc non Af Amer: 76 mL/min/{1.73_m2} (ref >59)
GFR, EST AFRICAN AMERICAN: 87 mL/min/{1.73_m2} (ref >59)
Glucose: 89 mg/dL (ref 65–99)
POTASSIUM: 5.2 mmol/L (ref 3.5–5.2)
SODIUM: 138 mmol/L (ref 134–144)

## 2013-09-30 LAB — VITAMIN D 25 HYDROXY (VIT D DEFICIENCY, FRACTURES): Vit D, 25-Hydroxy: 23.8 ng/mL — ABNORMAL LOW (ref 30.0–100.0)

## 2013-09-30 LAB — PSA, TOTAL AND FREE
PSA, Free Pct: 35.6 %
PSA, Free: 0.32 ng/mL
PSA: 0.9 ng/mL (ref 0.0–4.0)

## 2013-09-30 NOTE — Telephone Encounter (Signed)
Message copied by Waverly Ferrari on Fri Sep 30, 2013  3:00 PM ------      Message from: Chipper Herb      Created: Fri Sep 30, 2013  2:15 PM       The blood sugar was good at 89. The creatinine most of her kidney function test is within normal limits. The potassium and remainder of the electrolytes are also within normal limits      Liver function tests are within normal limits      All cholesterol numbers with advanced lipid testing are excellent and at goal----continue aggressive therapeutic lifestyle change      The PSA remains low and within normal limit      The vitamin D level is also very low and this will need to be treated.----- please call prescription in for vitamin D 50,000 units #12 one refill one weekly ------

## 2013-10-04 ENCOUNTER — Ambulatory Visit (INDEPENDENT_AMBULATORY_CARE_PROVIDER_SITE_OTHER): Payer: BC Managed Care – PPO | Admitting: Family Medicine

## 2013-10-04 ENCOUNTER — Encounter: Payer: Self-pay | Admitting: Family Medicine

## 2013-10-04 VITALS — BP 141/86 | HR 80 | Temp 98.4°F | Ht 69.0 in | Wt 183.0 lb

## 2013-10-04 DIAGNOSIS — J029 Acute pharyngitis, unspecified: Secondary | ICD-10-CM

## 2013-10-04 DIAGNOSIS — Z0000 Encounter for general adult medical examination without abnormal findings: Secondary | ICD-10-CM

## 2013-10-04 DIAGNOSIS — W57XXXA Bitten or stung by nonvenomous insect and other nonvenomous arthropods, initial encounter: Secondary | ICD-10-CM

## 2013-10-04 DIAGNOSIS — I10 Essential (primary) hypertension: Secondary | ICD-10-CM

## 2013-10-04 DIAGNOSIS — E559 Vitamin D deficiency, unspecified: Secondary | ICD-10-CM

## 2013-10-04 DIAGNOSIS — E349 Endocrine disorder, unspecified: Secondary | ICD-10-CM

## 2013-10-04 DIAGNOSIS — E785 Hyperlipidemia, unspecified: Secondary | ICD-10-CM

## 2013-10-04 MED ORDER — VITAMIN D (ERGOCALCIFEROL) 1.25 MG (50000 UNIT) PO CAPS: 50000.0000 [IU] | ORAL_CAPSULE | ORAL | Status: DC

## 2013-10-04 NOTE — Progress Notes (Signed)
Subjective:    Patient ID: Carl Mccullough, male    DOB: [birth year], 58 y.o.   MRN: [id number]  HPI Patient is here today for annual wellness exam and follow up of chronic medical problems. The patient also complains of some recent insect bites which are getting better and these are all over his body especially his lower extremities. The patient's recent lab work was reviewed with him and all lab numbers were excellent except his vitamin D level was very low.         Patient Active Problem List   Diagnosis Date Noted  . Diverticulosis of colon without hemorrhage 01/03/2013  . Erectile dysfunction 01/03/2013  . Allergic rhinitis 01/03/2013  . Hypertension 06/17/2012  . Hyperlipidemia 06/17/2012  . Testosterone deficiency 06/17/2012   Outpatient Encounter Prescriptions as of 10/04/2013  Medication Sig  . chlorpheniramine-HYDROcodone (TUSSIONEX PENNKINETIC ER) 10-8 MG/5ML LQCR Take 5 mLs by mouth at bedtime as needed for cough.  . fluticasone (FLONASE) 50 MCG/ACT nasal spray 1-2 sprays in each nostril at bedtime  . predniSONE (STERAPRED UNI-PAK) 10 MG tablet Take by mouth daily. Take 4 tablets x 2 days Take 3 tablets x 2 days Take 2 tablets x 2 days Take 1 tablet x 2 days  . sildenafil (VIAGRA) 100 MG tablet Take 0.5-1 tablets (50-100 mg total) by mouth daily as needed for erectile dysfunction.    Review of Systems  Constitutional: Negative.   HENT: Positive for sore throat. Negative for trouble swallowing (more of a "pulling sensation").   Eyes: Negative.   Respiratory: Negative.   Cardiovascular: Negative.   Gastrointestinal: Negative.   Endocrine: Negative.   Genitourinary: Negative.   Musculoskeletal: Negative.   Skin: Negative.   Allergic/Immunologic: Negative.   Neurological: Negative.   Hematological: Negative.   Psychiatric/Behavioral: Negative.        Objective:   Physical Exam  Nursing note and vitals reviewed. Constitutional: He is oriented to person,  place, and time. He appears well-developed and well-nourished. No distress.  HENT:  Head: Normocephalic and atraumatic.  Right Ear: External ear normal.  Left Ear: External ear normal.  Nose: Nose normal.  Mouth/Throat: Oropharynx is clear and moist. No oropharyngeal exudate.  Minimal redness posterior throat, minimal nasal congestion bilaterally  Eyes: Conjunctivae and EOM are normal. Pupils are equal, round, and reactive to light. Right eye exhibits no discharge. Left eye exhibits no discharge. No scleral icterus.  Neck: Normal range of motion. Neck supple. No thyromegaly present.  There was no anterior cervical adenopathy. There may have been some slight bilateral sensitivity to my feeling for the thyroid gland, but there was no thyroid fullness  Cardiovascular: Normal rate, regular rhythm, normal heart sounds and intact distal pulses.  Exam reveals no gallop and no friction rub.   No murmur heard. At 72 per minute without murmurs  Pulmonary/Chest: Effort normal and breath sounds normal. No respiratory distress. He has no wheezes. He has no rales. He exhibits no tenderness.  Abdominal: Soft. Bowel sounds are normal. He exhibits no mass. There is no tenderness. There is no rebound and no guarding.  Genitourinary: Rectum normal, prostate normal and penis normal.  Minimal if any enlargement of the prostate gland. No rectal masses. The external genitalia were normal without inguinal hernia being palpated. There were no inguinal nodes.  Musculoskeletal: Normal range of motion. He exhibits no edema and no tenderness.  Lymphadenopathy:    He has no cervical adenopathy.  Neurological: He is alert and oriented to person,  place, and time. He has normal reflexes. No cranial nerve deficit.  Skin: Skin is warm and dry. Rash noted. There is erythema. No pallor.  There were many many resolving insect bites of the lower extremities and trunk area. There were no signs of any infected bites just healing  bites.  Psychiatric: He has a normal mood and affect. His behavior is normal. Judgment and thought content normal.   BP 141/86  Pulse 80  Temp(Src) 98.4 F (36.9 C) (Oral)  Ht 5\' 9"  (1.753 m)  Wt 183 lb (83.008 kg)  BMI 27.01 kg/m2  EKG:--- Within normal limits       Assessment & Plan:  1. Testosterone deficiency  2. Essential hypertension  3. Hyperlipidemia  4. Annual physical exam - EKG 12-Lead  5. Insect bites  6. ST (sore throat) - Ambulatory referral to ENT  7. Throat soreness - Ambulatory referral to ENT  8. Vitamin D deficiency -Start vitamin D 50,000 units weekly #12 one refill  Patient Instructions  Continue current medications. Continue good therapeutic lifestyle changes which include good diet and exercise. Fall precautions discussed with patient. If an FOBT was given today- please return it to our front desk. If you are over 47 years old - you may need Prevnar 41 or the adult Pneumonia vaccine.  Flu Shots will be available at our office starting mid- September. Please call and schedule a FLU CLINIC APPOINTMENT. We will arrange an appointment for you to see the ear nose and throat specialist, Dr. Wilburn Cornelia. Continue to watch sodium intake Check blood pressures at home and record readings and let me review these in 4-6 weeks      Arrie Senate MD

## 2013-10-04 NOTE — Patient Instructions (Addendum)
Continue current medications. Continue good therapeutic lifestyle changes which include good diet and exercise. Fall precautions discussed with patient. If an FOBT was given today- please return it to our front desk. If you are over 60 years old - you may need Prevnar 39 or the adult Pneumonia vaccine.  Flu Shots will be available at our office starting mid- September. Please call and schedule a FLU CLINIC APPOINTMENT. We will arrange an appointment for you to see the ear nose and throat specialist, Dr. Wilburn Cornelia. Continue to watch sodium intake Check blood pressures at home and record readings and let me review these in 4-6 weeks

## 2013-10-05 ENCOUNTER — Other Ambulatory Visit: Payer: Self-pay | Admitting: Family Medicine

## 2013-10-05 ENCOUNTER — Encounter: Payer: Self-pay | Admitting: Family Medicine

## 2013-10-18 ENCOUNTER — Other Ambulatory Visit: Payer: Self-pay

## 2013-10-18 DIAGNOSIS — J020 Streptococcal pharyngitis: Secondary | ICD-10-CM

## 2014-02-21 ENCOUNTER — Ambulatory Visit: Payer: BLUE CROSS/BLUE SHIELD | Admitting: *Deleted

## 2014-02-21 ENCOUNTER — Ambulatory Visit (INDEPENDENT_AMBULATORY_CARE_PROVIDER_SITE_OTHER): Payer: BLUE CROSS/BLUE SHIELD | Admitting: *Deleted

## 2014-02-21 DIAGNOSIS — Z23 Encounter for immunization: Secondary | ICD-10-CM

## 2014-03-25 ENCOUNTER — Other Ambulatory Visit: Payer: Self-pay | Admitting: Family Medicine

## 2014-03-26 ENCOUNTER — Other Ambulatory Visit: Payer: Self-pay | Admitting: Family Medicine

## 2014-03-26 DIAGNOSIS — J302 Other seasonal allergic rhinitis: Secondary | ICD-10-CM

## 2014-03-26 MED ORDER — FLUTICASONE PROPIONATE 50 MCG/ACT NA SUSP: NASAL | Status: DC

## 2014-06-17 ENCOUNTER — Encounter: Payer: Self-pay | Admitting: Family Medicine

## 2014-06-17 ENCOUNTER — Ambulatory Visit (INDEPENDENT_AMBULATORY_CARE_PROVIDER_SITE_OTHER): Payer: BLUE CROSS/BLUE SHIELD | Admitting: Family Medicine

## 2014-06-17 VITALS — BP 130/92 | HR 72 | Temp 97.7°F | Ht 69.0 in | Wt 178.0 lb

## 2014-06-17 DIAGNOSIS — R1032 Left lower quadrant pain: Secondary | ICD-10-CM | POA: Diagnosis not present

## 2014-06-17 DIAGNOSIS — K5730 Diverticulosis of large intestine without perforation or abscess without bleeding: Secondary | ICD-10-CM | POA: Diagnosis not present

## 2014-06-17 LAB — POCT CBC
Granulocyte percent: 67.5 %G (ref 37–80)
HCT, POC: 48 % (ref 43.5–53.7)
Hemoglobin: 15 g/dL (ref 14.1–18.1)
Lymph, poc: 2.1 (ref 0.6–3.4)
MCH: 30.1 pg (ref 27–31.2)
MCHC: 31.3 g/dL — AB (ref 31.8–35.4)
MCV: 96.1 fL (ref 80–97)
MPV: 8.6 fL (ref 0–99.8)
POC Granulocyte: 5.6 (ref 2–6.9)
POC LYMPH PERCENT: 25.4 %L (ref 10–50)
Platelet Count, POC: 246 10*3/uL (ref 142–424)
RBC: 4.99 M/uL (ref 4.69–6.13)
RDW, POC: 13.1 %
WBC: 8.3 10*3/uL (ref 4.6–10.2)

## 2014-06-17 LAB — POCT URINALYSIS DIPSTICK
BILIRUBIN UA: NEGATIVE
Blood, UA: NEGATIVE
Glucose, UA: NEGATIVE
KETONES UA: NEGATIVE
LEUKOCYTES UA: NEGATIVE
Nitrite, UA: NEGATIVE
Protein, UA: NEGATIVE
Spec Grav, UA: 1.015
Urobilinogen, UA: NEGATIVE
pH, UA: 7.5

## 2014-06-17 LAB — POCT UA - MICROSCOPIC ONLY
BACTERIA, U MICROSCOPIC: NEGATIVE
CRYSTALS, UR, HPF, POC: NEGATIVE
Casts, Ur, LPF, POC: NEGATIVE
EPITHELIAL CELLS, URINE PER MICROSCOPY: NEGATIVE
MUCUS UA: NEGATIVE
RBC, URINE, MICROSCOPIC: NEGATIVE
WBC, Ur, HPF, POC: NEGATIVE

## 2014-06-17 NOTE — Addendum Note (Signed)
Addended by: Angelina Ok on: 06/17/2014 09:18 AM   Modules accepted: Orders

## 2014-06-17 NOTE — Patient Instructions (Signed)
Drink more water We will get your abdominal and pelvic ultrasound next week Return the FOBT We will call you with the lab work as soon as it becomes available

## 2014-06-17 NOTE — Progress Notes (Signed)
Patient ID: MCCLAIN SHALL, male   DOB: [birth year], 59 y.o.   MRN: [id number] S-59 year old male with history of diverticulosis complains of LLQ pain for 1 day. The patient denies any nausea vomiting diarrhea or blood in the stool. He has not had any problems passing his water or fever. The pain that he experienced was so sharp that it almost made him double over in the store and lasted for a few minutes and he has not had any further pain since. Also early this morning he had severe leg cramps. He indicates he does not drink a lot of water and he does have some caffeine early in the morning. He is up-to-date on his colonoscopies. He denies chest pain and shortness of breath.   O- The patient is alert.  LLQ pain and suprapubic tenderness he was tender in the left lower quadrant and suprapubic areas. There were no masses or organ enlargement or inguinal adenopathy.     No edema     No lymphadenopathy      Heart RRR with pulse 72 -   Lungs were clear anteriorly and posteriorly and the patient had normal bowel sounds. Without bruits   A-LLQ pain  P-CBC     U/A     [place] abd/pelvis     BMP  Patient Instructions  Drink more water We will get your abdominal and pelvic ultrasound next week Return the FOBT We will call you with the lab work as soon as it becomes available   Arrie Senate MD

## 2014-06-18 LAB — BMP8+EGFR
BUN / CREAT RATIO: 10 (ref 10–22)
BUN: 10 mg/dL (ref 8–27)
CALCIUM: 9.5 mg/dL (ref 8.6–10.2)
CHLORIDE: 100 mmol/L (ref 97–108)
CO2: 24 mmol/L (ref 18–29)
CREATININE: 1.01 mg/dL (ref 0.76–1.27)
GFR calc Af Amer: 93 mL/min/{1.73_m2} (ref >59)
GFR calc non Af Amer: 80 mL/min/{1.73_m2} (ref >59)
GLUCOSE: 87 mg/dL (ref 65–99)
Potassium: 5 mmol/L (ref 3.5–5.2)
Sodium: 139 mmol/L (ref 134–144)

## 2014-06-19 ENCOUNTER — Telehealth: Payer: Self-pay | Admitting: *Deleted

## 2014-06-19 NOTE — Telephone Encounter (Signed)
lmtcb regarding test results. 

## 2014-06-19 NOTE — Telephone Encounter (Signed)
-----   Message from Chipper Herb, MD sent at 06/19/2014 11:38 AM EDT ----- Please call this patient with results

## 2014-06-21 ENCOUNTER — Telehealth: Payer: Self-pay | Admitting: Family Medicine

## 2014-06-21 NOTE — Telephone Encounter (Signed)
Pt called and aware

## 2014-06-23 ENCOUNTER — Other Ambulatory Visit: Payer: Self-pay | Admitting: Family Medicine

## 2014-06-23 DIAGNOSIS — R1032 Left lower quadrant pain: Secondary | ICD-10-CM

## 2014-06-27 ENCOUNTER — Ambulatory Visit (HOSPITAL_COMMUNITY): Admission: RE | Admit: 2014-06-27 | Payer: BLUE CROSS/BLUE SHIELD | Source: Ambulatory Visit

## 2014-06-27 ENCOUNTER — Ambulatory Visit (HOSPITAL_COMMUNITY): Admission: RE | Admit: 2014-06-27 | Payer: Self-pay | Source: Ambulatory Visit

## 2014-11-23 ENCOUNTER — Telehealth: Payer: Self-pay | Admitting: *Deleted

## 2014-11-23 NOTE — Telephone Encounter (Signed)
Chart review shows patient is due for an annual physical. Last was 10/04/13. BP was > 140/90 at last office visit on 06/17/14. Left message on home voicemail asking patient to schedule his annual physical for Dr Laurance Flatten.  To close gap for [facility] blood pressure needs to be <140/90

## 2014-12-06 NOTE — Telephone Encounter (Signed)
Chart review shows patient scheduled appt. Note added to appt that BP was > 140/90 at last visit.

## 2014-12-08 ENCOUNTER — Ambulatory Visit (INDEPENDENT_AMBULATORY_CARE_PROVIDER_SITE_OTHER): Payer: BLUE CROSS/BLUE SHIELD | Admitting: Family Medicine

## 2014-12-08 ENCOUNTER — Ambulatory Visit (INDEPENDENT_AMBULATORY_CARE_PROVIDER_SITE_OTHER): Payer: BLUE CROSS/BLUE SHIELD

## 2014-12-08 ENCOUNTER — Encounter: Payer: Self-pay | Admitting: Family Medicine

## 2014-12-08 VITALS — BP 133/88 | HR 80 | Temp 97.3°F | Ht 69.0 in | Wt 185.0 lb

## 2014-12-08 DIAGNOSIS — I10 Essential (primary) hypertension: Secondary | ICD-10-CM | POA: Diagnosis not present

## 2014-12-08 DIAGNOSIS — Z23 Encounter for immunization: Secondary | ICD-10-CM | POA: Diagnosis not present

## 2014-12-08 DIAGNOSIS — E349 Endocrine disorder, unspecified: Secondary | ICD-10-CM

## 2014-12-08 DIAGNOSIS — R1030 Lower abdominal pain, unspecified: Secondary | ICD-10-CM

## 2014-12-08 DIAGNOSIS — N5201 Erectile dysfunction due to arterial insufficiency: Secondary | ICD-10-CM

## 2014-12-08 DIAGNOSIS — E559 Vitamin D deficiency, unspecified: Secondary | ICD-10-CM

## 2014-12-08 DIAGNOSIS — Z0000 Encounter for general adult medical examination without abnormal findings: Secondary | ICD-10-CM

## 2014-12-08 DIAGNOSIS — E785 Hyperlipidemia, unspecified: Secondary | ICD-10-CM

## 2014-12-08 MED ORDER — SILDENAFIL CITRATE 20 MG PO TABS: ORAL_TABLET | ORAL | Status: DC

## 2014-12-08 NOTE — Addendum Note (Signed)
Addended by: Zannie Cove on: 12/08/2014 03:51 PM   Modules accepted: Orders

## 2014-12-08 NOTE — Progress Notes (Signed)
Subjective:    Patient ID: Carl Mccullough, male    DOB: [birth year], 59 y.o.   MRN: [id number]  HPI Patient is here today for annual wellness exam and follow up of chronic medical problems which includes hyperlipidemia. He is taking medications regularly. This patient has a history of hyperlipidemia diverticulosis hypertension and erectile dysfunction. He periodically has problems with diverticulitis. He is due today to get his blood work done and return an FOBT and get a chest x-ray. We will also give him his flu shot today. The patient did notice that when he has this abdominal pain it was occurring on 1 occasion after working out in the yard and doing a lot of bending. The pain is located on the left side of his abdomen. The patient denies chest pain shortness of breath trouble swallowing heartburn indigestion nausea vomiting diarrhea or problems with voiding. His family history is positive for colon cancer and bladder cancer in his father. He is up-to-date on his colonoscopies. He does mention today that occasionally he will have some fecal staining material in his underwear and he is not sure why this occurs.      Patient Active Problem List   Diagnosis Date Noted  . Diverticulosis of colon without hemorrhage 01/03/2013  . Erectile dysfunction 01/03/2013  . Allergic rhinitis 01/03/2013  . Hypertension 06/17/2012  . Hyperlipidemia 06/17/2012  . Testosterone deficiency 06/17/2012   Outpatient Encounter Prescriptions as of 12/08/2014  Medication Sig  . sildenafil (VIAGRA) 100 MG tablet Take 0.5-1 tablets (50-100 mg total) by mouth daily as needed for erectile dysfunction.  . [DISCONTINUED] Vitamin D, Ergocalciferol, (DRISDOL) 50000 UNITS CAPS capsule Take 1 capsule (50,000 Units total) by mouth every 7 (seven) days.  . fluticasone (FLONASE) 50 MCG/ACT nasal spray 1-2 sprays in each nostril at bedtime (Patient not taking: Reported on 12/08/2014)   No facility-administered encounter  medications on file as of 12/08/2014.       Review of Systems  Constitutional: Negative.   HENT: Negative.   Eyes: Negative.   Respiratory: Negative.   Cardiovascular: Negative.   Gastrointestinal: Positive for abdominal pain (lower abd pain).  Endocrine: Negative.   Genitourinary: Negative.   Musculoskeletal: Negative.   Skin: Negative.   Allergic/Immunologic: Negative.   Neurological: Negative.   Hematological: Negative.   Psychiatric/Behavioral: Negative.        Objective:   Physical Exam  Constitutional: He is oriented to person, place, and time. He appears well-developed and well-nourished. No distress.  HENT:  Head: Normocephalic and atraumatic.  Right Ear: External ear normal.  Left Ear: External ear normal.  Nose: Nose normal.  Mouth/Throat: Oropharynx is clear and moist. No oropharyngeal exudate.  Eyes: Conjunctivae and EOM are normal. Pupils are equal, round, and reactive to light. Right eye exhibits no discharge. Left eye exhibits no discharge. No scleral icterus.  Neck: Normal range of motion. Neck supple. No tracheal deviation present. No thyromegaly present.  Neck without bruits or thyromegaly  Cardiovascular: Normal rate, regular rhythm, normal heart sounds and intact distal pulses.  Exam reveals no friction rub.   No murmur heard. The heart is regular at 72/m  Pulmonary/Chest: Effort normal and breath sounds normal. No respiratory distress. He has no wheezes. He has no rales. He exhibits no tenderness.  Lungs are clear anteriorly and posteriorly and the axillary regions were negative for adenopathy  Abdominal: Soft. Bowel sounds are normal. He exhibits no mass. There is no tenderness. There is no rebound and no guarding.  The abdomen is nontender without masses or organ enlargement or inguinal adenopathy. Specifically there is no tenderness today in the left lower quadrant.  Genitourinary: Rectum normal, prostate normal and penis normal.  The prostate was  essentially normal or maybe slightly enlarged but no lumps or masses were palpable. There were no rectal masses. There were no inguinal hernias palpable in the external genitalia were within normal limits.  Musculoskeletal: Normal range of motion. He exhibits no edema or tenderness.  The patient had good leg raising and hip abduction without pain  Lymphadenopathy:    He has no cervical adenopathy.  Neurological: He is alert and oriented to person, place, and time. He has normal reflexes. No cranial nerve deficit.  Skin: Skin is warm and dry. No rash noted. No erythema. No pallor.  Psychiatric: He has a normal mood and affect. His behavior is normal. Judgment and thought content normal.  Nursing note and vitals reviewed.  BP 133/88 mmHg  Pulse 80  Temp(Src) 97.3 F (36.3 C) (Oral)  Ht 5' 9"  (1.753 m)  Wt 185 lb (83.915 kg)  BMI 27.31 kg/m2        Assessment & Plan:  1. Annual physical exam -Return the FOBT card - DG Chest 2 View; Future - BMP8+EGFR - CBC with Differential/Platelet - Hepatic function panel - NMR, lipoprofile - POCT UA - Microscopic Only - POCT urinalysis dipstick - Vit D  25 hydroxy (rtn osteoporosis monitoring) - PSA  2. Hyperlipidemia -Continue aggressive therapeutic lifestyle changes and sodium restriction - CBC with Differential/Platelet - NMR, lipoprofile  3. Essential hypertension -Lose some weight drink more water and watch sodium intake - DG Chest 2 View; Future - BMP8+EGFR - CBC with Differential/Platelet - Hepatic function panel  4. Testosterone deficiency -The patient does have erectile dysfunction but is currently using sildenafil.  5. Vitamin D deficiency -He is currently not taking any vitamin D replacement - Vit D  25 hydroxy (rtn osteoporosis monitoring)  6. Erectile dysfunction -Prescription for a sildenafil 20 mg 50 tablets given to patient  Meds ordered this encounter  Medications  . sildenafil (REVATIO) 20 MG tablet     Sig: Take 2-5 tabs PRN prior to sexual activity.    Dispense:  50 tablet    Refill:  1   Patient Instructions  Continue current medications. Continue good therapeutic lifestyle changes which include good diet and exercise. Fall precautions discussed with patient. If an FOBT was given today- please return it to our front desk. If you are over 76 years old - you may need Prevnar 21 or the adult Pneumonia vaccine.  **Flu shots are available--- please call and schedule a FLU-CLINIC appointment**  After your visit with [place] today you will receive a survey in the mail or online from [employer] regarding your care with [place]. Please take a moment to fill this out. Your feedback is very important to [place] as you can help [place] better understand your patient needs as well as improve your experience and satisfaction. WE CARE ABOUT YOU!!!   The patient should make every effort possible to lose weight He should watch his sodium intake much more closely He should continue to exercise regularly and drink plenty of water We will arrange to get the ultrasound of the abdomen and pelvis We will call with the results of the chest x-ray as soon as those results become available Flu shot that you received today may make your arm sore and be aware of this Return the FOBT  Continue to monitor blood pressures at home and bring readings by for review in about 2 weeks and have the nurse check your blood pressure here and get your weight+++++ Try to keep a diet history and see if this relates to any problems with your bowel movements   Arrie Senate MD

## 2014-12-08 NOTE — Addendum Note (Signed)
Addended by: Zannie Cove on: 12/08/2014 04:10 PM   Modules accepted: Orders

## 2014-12-08 NOTE — Patient Instructions (Addendum)
Continue current medications. Continue good therapeutic lifestyle changes which include good diet and exercise. Fall precautions discussed with patient. If an FOBT was given today- please return it to our front desk. If you are over 61 years old - you may need Prevnar 55 or the adult Pneumonia vaccine.  **Flu shots are available--- please call and schedule a FLU-CLINIC appointment**  After your visit with [place] today you will receive a survey in the mail or online from [employer] regarding your care with [place]. Please take a moment to fill this out. Your feedback is very important to [place] as you can help [place] better understand your patient needs as well as improve your experience and satisfaction. WE CARE ABOUT YOU!!!   The patient should make every effort possible to lose weight He should watch his sodium intake much more closely He should continue to exercise regularly and drink plenty of water We will arrange to get the ultrasound of the abdomen and pelvis We will call with the results of the chest x-ray as soon as those results become available Flu shot that you received today may make your arm sore and be aware of this Return the FOBT Continue to monitor blood pressures at home and bring readings by for review in about 2 weeks and have the nurse check your blood pressure here and get your weight+++++ Try to keep a diet history and see if this relates to any problems with your bowel movements

## 2014-12-09 LAB — NMR, LIPOPROFILE
Cholesterol: 180 mg/dL (ref 100–199)
HDL CHOLESTEROL BY NMR: 74 mg/dL (ref >39)
HDL Particle Number: 39.7 umol/L (ref >=30.5)
LDL PARTICLE NUMBER: 829 nmol/L (ref <1000)
LDL Size: 21 nm (ref >20.5)
LDL-C: 90 mg/dL (ref 0–99)
LP-IR Score: 33 (ref <=45)
SMALL LDL PARTICLE NUMBER: 207 nmol/L (ref <=527)
TRIGLYCERIDES BY NMR: 79 mg/dL (ref 0–149)

## 2014-12-09 LAB — CBC WITH DIFFERENTIAL/PLATELET
BASOS ABS: 0.1 10*3/uL (ref 0.0–0.2)
BASOS: 1 %
EOS (ABSOLUTE): 0.1 10*3/uL (ref 0.0–0.4)
Eos: 1 %
Hematocrit: 44.7 % (ref 37.5–51.0)
Hemoglobin: 15.4 g/dL (ref 12.6–17.7)
IMMATURE GRANULOCYTES: 0 %
Immature Grans (Abs): 0 10*3/uL (ref 0.0–0.1)
Lymphocytes Absolute: 2.3 10*3/uL (ref 0.7–3.1)
Lymphs: 27 %
MCH: 32.4 pg (ref 26.6–33.0)
MCHC: 34.5 g/dL (ref 31.5–35.7)
MCV: 94 fL (ref 79–97)
MONOS ABS: 0.6 10*3/uL (ref 0.1–0.9)
Monocytes: 7 %
Neutrophils Absolute: 5.5 10*3/uL (ref 1.4–7.0)
Neutrophils: 64 %
PLATELETS: 320 10*3/uL (ref 150–379)
RBC: 4.75 x10E6/uL (ref 4.14–5.80)
RDW: 12.9 % (ref 12.3–15.4)
WBC: 8.5 10*3/uL (ref 3.4–10.8)

## 2014-12-09 LAB — BMP8+EGFR
BUN/Creatinine Ratio: 12 (ref 10–22)
BUN: 12 mg/dL (ref 8–27)
CHLORIDE: 101 mmol/L (ref 97–106)
CO2: 25 mmol/L (ref 18–29)
Calcium: 9.6 mg/dL (ref 8.6–10.2)
Creatinine, Ser: 1.03 mg/dL (ref 0.76–1.27)
GFR calc Af Amer: 90 mL/min/{1.73_m2} (ref >59)
GFR calc non Af Amer: 78 mL/min/{1.73_m2} (ref >59)
GLUCOSE: 89 mg/dL (ref 65–99)
POTASSIUM: 4.9 mmol/L (ref 3.5–5.2)
SODIUM: 139 mmol/L (ref 136–144)

## 2014-12-09 LAB — VITAMIN D 25 HYDROXY (VIT D DEFICIENCY, FRACTURES): Vit D, 25-Hydroxy: 19.7 ng/mL — ABNORMAL LOW (ref 30.0–100.0)

## 2014-12-09 LAB — HEPATIC FUNCTION PANEL
ALT: 19 IU/L (ref 0–44)
AST: 17 IU/L (ref 0–40)
Albumin: 4.7 g/dL (ref 3.6–4.8)
Alkaline Phosphatase: 96 IU/L (ref 39–117)
BILIRUBIN TOTAL: 0.3 mg/dL (ref 0.0–1.2)
Bilirubin, Direct: 0.1 mg/dL (ref 0.00–0.40)
Total Protein: 7.5 g/dL (ref 6.0–8.5)

## 2014-12-09 LAB — PSA: PROSTATE SPECIFIC AG, SERUM: 0.9 ng/mL (ref 0.0–4.0)

## 2014-12-11 ENCOUNTER — Other Ambulatory Visit: Payer: Self-pay | Admitting: *Deleted

## 2014-12-11 MED ORDER — VITAMIN D (ERGOCALCIFEROL) 1.25 MG (50000 UNIT) PO CAPS: 50000.0000 [IU] | ORAL_CAPSULE | ORAL | Status: DC

## 2014-12-12 ENCOUNTER — Encounter: Payer: Self-pay | Admitting: Family Medicine

## 2014-12-12 ENCOUNTER — Ambulatory Visit (INDEPENDENT_AMBULATORY_CARE_PROVIDER_SITE_OTHER): Payer: BLUE CROSS/BLUE SHIELD | Admitting: Family Medicine

## 2014-12-12 VITALS — BP 124/81 | HR 89 | Temp 99.3°F | Ht 69.0 in | Wt 187.0 lb

## 2014-12-12 DIAGNOSIS — L723 Sebaceous cyst: Secondary | ICD-10-CM

## 2014-12-12 MED ORDER — SULFAMETHOXAZOLE-TRIMETHOPRIM 800-160 MG PO TABS: ORAL_TABLET | ORAL | Status: DC

## 2014-12-12 MED ORDER — CEFTRIAXONE SODIUM 1 G IJ SOLR: 1.0000 g | Freq: Once | INTRAMUSCULAR | Status: DC

## 2014-12-12 MED ORDER — CEFTRIAXONE SODIUM 1 G IJ SOLR
1.0000 g | Freq: Once | INTRAMUSCULAR | Status: AC
  Administered 2014-12-12: 1 g via INTRAMUSCULAR

## 2014-12-12 NOTE — Progress Notes (Signed)
   Subjective:    Patient ID: Carl Mccullough, male    DOB: 1955/12/30, 59 y.o.   MRN: [id number]  HPI The patient has had the sebaceous cyst of his left upper back for many years and just recently it has become inflamed and tender and painful.   Review of Systems  Skin: Positive for color change. Negative for pallor, rash and wound.       Painful and inflamed cyst of the left upper back  All other systems reviewed and are negative.      Objective:   Physical Exam  Constitutional: He appears well-developed and well-nourished.  HENT:  Head: Normocephalic.  Musculoskeletal: Normal range of motion.  Neurological: He is alert.  Skin: Skin is warm. There is erythema.  Large inflamed cyst of the left upper back. Tender to palpation.  Psychiatric: He has a normal mood and affect. His behavior is normal. Judgment and thought content normal.  Nursing note and vitals reviewed.  1 g of Rocephin IM BP 124/81 mmHg  Pulse 89  Temp(Src) 99.3 F (37.4 C) (Oral)  Ht 5\' 9"  (1.753 m)  Wt 187 lb (84.823 kg)  BMI 27.60 kg/m2       Assessment & Plan:  1. Inflamed sebaceous cyst - sulfamethoxazole-trimethoprim (BACTRIM DS,SEPTRA DS) 800-160 MG tablet; One twice daily with food  Dispense: 60 tablet; Refill: 0 -Use warm wet compresses and take extra strength Tylenol as needed for pain -Recheck in 3 days  Patient Instructions  Use warm wet compresses Take antibiotic as directed Recheck in 3 days   Arrie Senate MD

## 2014-12-12 NOTE — Patient Instructions (Signed)
Use warm wet compresses Take antibiotic as directed Recheck in 3 days

## 2014-12-13 ENCOUNTER — Other Ambulatory Visit: Payer: Self-pay | Admitting: *Deleted

## 2014-12-13 ENCOUNTER — Ambulatory Visit (INDEPENDENT_AMBULATORY_CARE_PROVIDER_SITE_OTHER): Payer: BLUE CROSS/BLUE SHIELD | Admitting: Family Medicine

## 2014-12-13 DIAGNOSIS — L723 Sebaceous cyst: Principal | ICD-10-CM

## 2014-12-13 DIAGNOSIS — L089 Local infection of the skin and subcutaneous tissue, unspecified: Secondary | ICD-10-CM

## 2014-12-13 MED ORDER — CEFTRIAXONE SODIUM 1 G IJ SOLR
1.0000 g | Freq: Once | INTRAMUSCULAR | Status: AC
  Administered 2014-12-13: 1 g via INTRAMUSCULAR

## 2014-12-13 NOTE — Progress Notes (Signed)
Patient came in for 2nd rocephin injection

## 2014-12-13 NOTE — Progress Notes (Signed)
Per Dr. Laurance Flatten give Rocephin 1gm for infected sebaceous cyst. Order placed.

## 2014-12-15 ENCOUNTER — Ambulatory Visit: Payer: BLUE CROSS/BLUE SHIELD | Admitting: Family Medicine

## 2014-12-16 ENCOUNTER — Encounter: Payer: Self-pay | Admitting: Family Medicine

## 2014-12-18 ENCOUNTER — Ambulatory Visit (HOSPITAL_COMMUNITY): Admission: RE | Admit: 2014-12-18 | Payer: BLUE CROSS/BLUE SHIELD | Source: Ambulatory Visit

## 2014-12-19 ENCOUNTER — Ambulatory Visit (HOSPITAL_COMMUNITY)
Admission: RE | Admit: 2014-12-19 | Discharge: 2014-12-19 | Disposition: A | Discharge status: Home / Self Care | Payer: BLUE CROSS/BLUE SHIELD | Source: Ambulatory Visit | Attending: Family Medicine | Admitting: Family Medicine

## 2014-12-19 DIAGNOSIS — R1030 Lower abdominal pain, unspecified: Secondary | ICD-10-CM

## 2014-12-19 DIAGNOSIS — K824 Cholesterolosis of gallbladder: Secondary | ICD-10-CM | POA: Insufficient documentation

## 2014-12-19 DIAGNOSIS — R1032 Left lower quadrant pain: Secondary | ICD-10-CM

## 2014-12-22 ENCOUNTER — Ambulatory Visit: Payer: BLUE CROSS/BLUE SHIELD | Admitting: Family Medicine

## 2015-01-08 ENCOUNTER — Other Ambulatory Visit: Payer: Self-pay | Admitting: *Deleted

## 2015-01-08 MED ORDER — METRONIDAZOLE 500 MG PO TABS: 500.0000 mg | ORAL_TABLET | Freq: Three times a day (TID) | ORAL | Status: DC

## 2015-01-08 MED ORDER — CIPROFLOXACIN HCL 500 MG PO TABS: 500.0000 mg | ORAL_TABLET | Freq: Two times a day (BID) | ORAL | Status: DC

## 2015-01-08 NOTE — Telephone Encounter (Signed)
Phoned in per [facility] to [facility] for pt - abdominal issues

## 2015-06-20 ENCOUNTER — Encounter (INDEPENDENT_AMBULATORY_CARE_PROVIDER_SITE_OTHER): Payer: Self-pay

## 2015-06-21 ENCOUNTER — Ambulatory Visit (INDEPENDENT_AMBULATORY_CARE_PROVIDER_SITE_OTHER): Payer: BLUE CROSS/BLUE SHIELD | Admitting: Family Medicine

## 2015-06-21 ENCOUNTER — Encounter: Payer: Self-pay | Admitting: Family Medicine

## 2015-06-21 VITALS — BP 134/89 | HR 69 | Temp 97.3°F | Ht 69.0 in | Wt 189.0 lb

## 2015-06-21 DIAGNOSIS — W57XXXA Bitten or stung by nonvenomous insect and other nonvenomous arthropods, initial encounter: Secondary | ICD-10-CM | POA: Diagnosis not present

## 2015-06-21 DIAGNOSIS — R202 Paresthesia of skin: Secondary | ICD-10-CM | POA: Diagnosis not present

## 2015-06-21 DIAGNOSIS — R030 Elevated blood-pressure reading, without diagnosis of hypertension: Secondary | ICD-10-CM

## 2015-06-21 DIAGNOSIS — S80921A Unspecified superficial injury of right lower leg, initial encounter: Secondary | ICD-10-CM

## 2015-06-21 DIAGNOSIS — S80922A Unspecified superficial injury of left lower leg, initial encounter: Secondary | ICD-10-CM | POA: Diagnosis not present

## 2015-06-21 DIAGNOSIS — IMO0001 Reserved for inherently not codable concepts without codable children: Secondary | ICD-10-CM

## 2015-06-21 DIAGNOSIS — R200 Anesthesia of skin: Secondary | ICD-10-CM

## 2015-06-21 NOTE — Progress Notes (Signed)
Subjective:    Patient ID: Carl Mccullough, male    DOB: 01-Jan-1956, 60 y.o.   MRN: [id number]  HPI Patient here today for multiple recent tick bites.The patient has been getting recurrent tick bites. Some of these are dog ticks and some were deer ticks. He has a past history of hypertension and his blood pressure readings at home have been good according to the patient. He's been complaining of some tingling in his face some queasy feelings and some numbness in his arm. When he has had this his blood pressures have been good at home. The patient denies any chest pain or shortness of breath. His queasiness is better since he's been off the doxycycline. His sunlight reaction is also better off the doxycycline. He indicates he sleeps with his wrist bent up under him and he is going to purchase a wrist brace to wear at nighttime while sleeping. Denies any chest pain or shortness of breath or trouble with his GI tract other than the queasiness. He has no other symptoms to report. He has gained weight and understands he needs to lose some weight.     Patient Active Problem List   Diagnosis Date Noted  . Diverticulosis of colon without hemorrhage 01/03/2013  . Erectile dysfunction 01/03/2013  . Allergic rhinitis 01/03/2013  . Hypertension 06/17/2012  . Hyperlipidemia 06/17/2012  . Testosterone deficiency 06/17/2012   Outpatient Encounter Prescriptions as of 06/21/2015  Medication Sig  . fluticasone (FLONASE) 50 MCG/ACT nasal spray 1-2 sprays in each nostril at bedtime  . sildenafil (REVATIO) 20 MG tablet Take 2-5 tabs PRN prior to sexual activity.  . sildenafil (VIAGRA) 100 MG tablet Take 0.5-1 tablets (50-100 mg total) by mouth daily as needed for erectile dysfunction.  . Vitamin D, Ergocalciferol, (DRISDOL) 50000 UNITS CAPS capsule Take 1 capsule (50,000 Units total) by mouth every 7 (seven) days.  . [DISCONTINUED] ciprofloxacin (CIPRO) 500 MG tablet Take 1 tablet (500 mg total) by mouth 2 (two)  times daily.  . [DISCONTINUED] metroNIDAZOLE (FLAGYL) 500 MG tablet Take 1 tablet (500 mg total) by mouth 3 (three) times daily.   No facility-administered encounter medications on file as of 06/21/2015.      Review of Systems  Constitutional: Negative.   HENT: Negative.   Eyes: Negative.   Respiratory: Negative.   Cardiovascular: Negative.   Gastrointestinal: Negative.   Endocrine: Negative.   Genitourinary: Negative.   Musculoskeletal: Negative.   Skin: Negative.        Several tick bites  Allergic/Immunologic: Negative.   Neurological: Negative.   Hematological: Negative.   Psychiatric/Behavioral: Negative.        Objective:   Physical Exam  Constitutional: He is oriented to person, place, and time. He appears well-developed and well-nourished. No distress.  HENT:  Head: Normocephalic and atraumatic.  Eyes: Conjunctivae and EOM are normal. Pupils are equal, round, and reactive to light. Right eye exhibits no discharge. Left eye exhibits no discharge. No scleral icterus.  Neck: Normal range of motion. Neck supple. No thyromegaly present.  No bruits or thyromegaly  Cardiovascular: Normal rate, regular rhythm and normal heart sounds.   No murmur heard. 72/m with regular rate and rhythm  Pulmonary/Chest: Effort normal and breath sounds normal. No respiratory distress. He has no wheezes. He has no rales. He exhibits no tenderness.  Clear anteriorly and posteriorly with no axillary adenopathy  Abdominal: Soft. Bowel sounds are normal. He exhibits no mass. There is no tenderness. There is no rebound and no  guarding.  No abdominal tenderness liver or spleen enlargement or bruits  Musculoskeletal: Normal range of motion. He exhibits no edema.  Lymphadenopathy:    He has no cervical adenopathy.  Neurological: He is alert and oriented to person, place, and time.  Skin: Skin is warm and dry. No rash noted.  Healing tick bite wounds  Psychiatric: He has a normal mood and affect.  His behavior is normal. Judgment and thought content normal.  Nursing note and vitals reviewed.   BP 156/110 mmHg  Pulse 73  Temp(Src) 97.3 F (36.3 C) (Oral)  Ht _0  (1.753 m)  Wt 189 lb (85.73 kg)  BMI 27.90 kg/m2       Assessment & Plan:  1. Tick bites -Hold the doxycycline -Use sprays like deep [place] off prior to working in the yard - CBC with Differential/Platelet - BMP8+EGFR - Lyme Ab/Western Blot Reflex - Rocky mtn spotted fvr abs pnl(IgG+IgM)  2. Elevated blood pressure -Watch sodium intake, lose weight and monitor blood pressures at home and review readings in 6 weeks  3. Numbness and tingling in left hand -Wear wrist brace at nighttime on left hand  Patient Instructions  Patient should use deep [place] off any kind of tick repellent that he can apply before working outside He should wear 2 socks and cut his pants in to the second sock He should take the garlic to see if this helps We will hold off on any kind of antibiotic for the time being since he's had some nausea and sun sensitivity to this He will continue to monitor blood pressure readings and we will review these in about 4-6 weeks He will work on his weight and reduce his sodium intake He will wear the wrist brace at night time He should record blood pressure readings at home as mentioned above and especially if he stressed that any time   Arrie Senate MD

## 2015-06-21 NOTE — Patient Instructions (Addendum)
Patient should use deep [place] off any kind of tick repellent that he can apply before working outside He should wear 2 socks and cut his pants in to the second sock He should take the garlic to see if this helps We will hold off on any kind of antibiotic for the time being since he's had some nausea and sun sensitivity to this He will continue to monitor blood pressure readings and we will review these in about 4-6 weeks He will work on his weight and reduce his sodium intake He will wear the wrist brace at night time He should record blood pressure readings at home as mentioned above and especially if he stressed that any time

## 2015-06-26 LAB — CBC WITH DIFFERENTIAL/PLATELET
BASOS ABS: 0 10*3/uL (ref 0.0–0.2)
BASOS: 0 %
EOS (ABSOLUTE): 0.2 10*3/uL (ref 0.0–0.4)
Eos: 3 %
HEMATOCRIT: 44.6 % (ref 37.5–51.0)
HEMOGLOBIN: 15.7 g/dL (ref 12.6–17.7)
IMMATURE GRANS (ABS): 0 10*3/uL (ref 0.0–0.1)
IMMATURE GRANULOCYTES: 0 %
LYMPHS: 34 %
Lymphocytes Absolute: 1.9 10*3/uL (ref 0.7–3.1)
MCH: 33.5 pg — ABNORMAL HIGH (ref 26.6–33.0)
MCHC: 35.2 g/dL (ref 31.5–35.7)
MCV: 95 fL (ref 79–97)
MONOS ABS: 0.4 10*3/uL (ref 0.1–0.9)
Monocytes: 8 %
NEUTROS PCT: 55 %
Neutrophils Absolute: 3 10*3/uL (ref 1.4–7.0)
Platelets: 260 10*3/uL (ref 150–379)
RBC: 4.69 x10E6/uL (ref 4.14–5.80)
RDW: 13.6 % (ref 12.3–15.4)
WBC: 5.6 10*3/uL (ref 3.4–10.8)

## 2015-06-26 LAB — BMP8+EGFR
BUN/Creatinine Ratio: 11 (ref 10–24)
BUN: 11 mg/dL (ref 8–27)
CALCIUM: 9.6 mg/dL (ref 8.6–10.2)
CO2: 25 mmol/L (ref 18–29)
CREATININE: 1.01 mg/dL (ref 0.76–1.27)
Chloride: 99 mmol/L (ref 96–106)
GFR, EST AFRICAN AMERICAN: 92 mL/min/{1.73_m2} (ref >59)
GFR, EST NON AFRICAN AMERICAN: 80 mL/min/{1.73_m2} (ref >59)
Glucose: 90 mg/dL (ref 65–99)
POTASSIUM: 5.3 mmol/L — AB (ref 3.5–5.2)
Sodium: 138 mmol/L (ref 134–144)

## 2015-06-26 LAB — ROCKY MTN SPOTTED FVR ABS PNL(IGG+IGM)
RMSF IgG: POSITIVE — AB
RMSF IgM: 0.91 index — ABNORMAL HIGH (ref 0.00–0.89)

## 2015-06-26 LAB — LYME AB/WESTERN BLOT REFLEX: Lyme IgG/IgM Ab: <0.91 {ISR} (ref 0.00–0.90)

## 2015-06-26 LAB — RMSF, IGG, IFA

## 2015-07-06 ENCOUNTER — Other Ambulatory Visit: Payer: Self-pay | Admitting: *Deleted

## 2015-07-06 MED ORDER — DOXYCYCLINE HYCLATE 100 MG PO TABS: 100.0000 mg | ORAL_TABLET | Freq: Two times a day (BID) | ORAL | Status: DC

## 2015-07-06 NOTE — Telephone Encounter (Signed)
Pt aware that [facility] ordered rf on meds - doxy

## 2015-07-19 ENCOUNTER — Encounter: Payer: Self-pay | Admitting: Family Medicine

## 2015-07-19 ENCOUNTER — Ambulatory Visit (INDEPENDENT_AMBULATORY_CARE_PROVIDER_SITE_OTHER): Payer: BLUE CROSS/BLUE SHIELD | Admitting: Family Medicine

## 2015-07-19 VITALS — BP 136/89 | HR 92 | Temp 97.3°F | Ht 69.0 in | Wt 189.0 lb

## 2015-07-19 DIAGNOSIS — R509 Fever, unspecified: Secondary | ICD-10-CM

## 2015-07-19 DIAGNOSIS — R1032 Left lower quadrant pain: Secondary | ICD-10-CM | POA: Diagnosis not present

## 2015-07-19 DIAGNOSIS — Z8619 Personal history of other infectious and parasitic diseases: Secondary | ICD-10-CM | POA: Diagnosis not present

## 2015-07-19 LAB — CBC WITH DIFFERENTIAL/PLATELET
BASOS: 0 %
Basophils Absolute: 0 10*3/uL (ref 0.0–0.2)
EOS (ABSOLUTE): 0.1 10*3/uL (ref 0.0–0.4)
EOS: 0 %
HEMOGLOBIN: 14.9 g/dL (ref 12.6–17.7)
Hematocrit: 42.2 % (ref 37.5–51.0)
Lymphocytes Absolute: 2.2 10*3/uL (ref 0.7–3.1)
Lymphs: 16 %
MCH: 32.9 pg (ref 26.6–33.0)
MCHC: 35.3 g/dL (ref 31.5–35.7)
MCV: 93 fL (ref 79–97)
MONOS ABS: 1.5 10*3/uL — AB (ref 0.1–0.9)
Monocytes: 11 %
NEUTROS ABS: 10 10*3/uL — AB (ref 1.4–7.0)
NEUTROS PCT: 73 %
Platelets: 224 10*3/uL (ref 150–379)
RBC: 4.53 x10E6/uL (ref 4.14–5.80)
RDW: 13 % (ref 12.3–15.4)
WBC: 13.8 10*3/uL — ABNORMAL HIGH (ref 3.4–10.8)

## 2015-07-19 LAB — BMP8+EGFR
BUN / CREAT RATIO: 13 (ref 10–24)
BUN: 12 mg/dL (ref 8–27)
CO2: 25 mmol/L (ref 18–29)
CREATININE: 0.94 mg/dL (ref 0.76–1.27)
Calcium: 9.8 mg/dL (ref 8.6–10.2)
Chloride: 99 mmol/L (ref 96–106)
GFR, EST AFRICAN AMERICAN: 101 mL/min/{1.73_m2} (ref >59)
GFR, EST NON AFRICAN AMERICAN: 87 mL/min/{1.73_m2} (ref >59)
Glucose: 95 mg/dL (ref 65–99)
POTASSIUM: 4.7 mmol/L (ref 3.5–5.2)
SODIUM: 133 mmol/L — AB (ref 134–144)

## 2015-07-19 LAB — URINALYSIS, COMPLETE
BILIRUBIN UA: NEGATIVE
GLUCOSE, UA: NEGATIVE
Ketones, UA: NEGATIVE
LEUKOCYTES UA: NEGATIVE
Nitrite, UA: NEGATIVE
PROTEIN UA: NEGATIVE
RBC UA: NEGATIVE
SPEC GRAV UA: 1.02 (ref 1.005–1.030)
UUROB: 0.2 mg/dL (ref 0.2–1.0)
pH, UA: 5.5 (ref 5.0–7.5)

## 2015-07-19 LAB — MICROSCOPIC EXAMINATION
BACTERIA UA: NONE SEEN
Epithelial Cells (non renal): NONE SEEN /hpf (ref 0–10)
RBC MICROSCOPIC, UA: NONE SEEN /HPF (ref 0–?)
WBC, UA: NONE SEEN /hpf (ref 0–?)

## 2015-07-19 LAB — HEPATIC FUNCTION PANEL
ALBUMIN: 4.3 g/dL (ref 3.6–4.8)
ALT: 18 IU/L (ref 0–44)
AST: 18 IU/L (ref 0–40)
Alkaline Phosphatase: 92 IU/L (ref 39–117)
Bilirubin Total: 0.7 mg/dL (ref 0.0–1.2)
Bilirubin, Direct: 0.17 mg/dL (ref 0.00–0.40)
TOTAL PROTEIN: 7 g/dL (ref 6.0–8.5)

## 2015-07-19 LAB — CULTURE, GROUP A STREP

## 2015-07-19 LAB — RAPID STREP SCREEN (MED CTR MEBANE ONLY): STREP GP A AG, IA W/REFLEX: NEGATIVE

## 2015-07-19 NOTE — Progress Notes (Signed)
Subjective:    Patient ID: Carl Mccullough, male    DOB: [birth year], 60 y.o.   MRN: [id number]  HPI Patient here today for fever. The patient call this morning and complains of fever up 201 early this morning with chills and malaise. He is recently been diagnosed with [facility] spotted fever and is been taking doxycycline for 2-1/2 weeks. He has a history of diverticulosis and diverticulitis. The patient denies chest pain or shortness of breath cough or sore throat. He has had some abdominal pain with more gas and bloating and he did describe some dysuria. There is no nausea and vomiting.     Patient Active Problem List   Diagnosis Date Noted  . Diverticulosis of colon without hemorrhage 01/03/2013  . Erectile dysfunction 01/03/2013  . Allergic rhinitis 01/03/2013  . Hypertension 06/17/2012  . Hyperlipidemia 06/17/2012  . Testosterone deficiency 06/17/2012   Outpatient Encounter Prescriptions as of 07/19/2015  Medication Sig  . fluticasone (FLONASE) 50 MCG/ACT nasal spray 1-2 sprays in each nostril at bedtime  . sildenafil (REVATIO) 20 MG tablet Take 2-5 tabs PRN prior to sexual activity.  . sildenafil (VIAGRA) 100 MG tablet Take 0.5-1 tablets (50-100 mg total) by mouth daily as needed for erectile dysfunction.  . Vitamin D, Ergocalciferol, (DRISDOL) 50000 UNITS CAPS capsule Take 1 capsule (50,000 Units total) by mouth every 7 (seven) days.  Marland Kitchen doxycycline (VIBRA-TABS) 100 MG tablet Take 1 tablet (100 mg total) by mouth 2 (two) times daily. (Patient not taking: Reported on 07/19/2015)   No facility-administered encounter medications on file as of 07/19/2015.      Review of Systems  Constitutional: Positive for fever.  HENT: Negative.   Eyes: Negative.   Respiratory: Negative.   Cardiovascular: Negative.   Gastrointestinal: Negative.   Endocrine: Negative.   Genitourinary: Negative.   Musculoskeletal: Positive for myalgias.  Skin: Negative.   Allergic/Immunologic: Negative.     Neurological: Negative.   Hematological: Negative.   Psychiatric/Behavioral: Negative.        Objective:   Physical Exam  Constitutional: He is oriented to person, place, and time. He appears well-developed and well-nourished.  HENT:  Head: Normocephalic and atraumatic.  Right Ear: External ear normal.  Left Ear: External ear normal.  Nose: Nose normal.  Mouth/Throat: Oropharynx is clear and moist. No oropharyngeal exudate.  Eyes: Conjunctivae and EOM are normal. Pupils are equal, round, and reactive to light. Right eye exhibits no discharge. Left eye exhibits no discharge. No scleral icterus.  Neck: Normal range of motion. Neck supple. No thyromegaly present.  No anterior cervical nodes  Cardiovascular: Normal rate, regular rhythm and normal heart sounds.   No murmur heard. Pulmonary/Chest: Effort normal and breath sounds normal. No respiratory distress. He has no wheezes. He has no rales. He exhibits no tenderness.  Clear anteriorly and posteriorly with no axillary adenopathy  Abdominal: Soft. Bowel sounds are normal. He exhibits no mass. There is no tenderness. There is no rebound and no guarding.  There was some abdominal gas and slight tenderness in the left lower quadrant  Musculoskeletal: Normal range of motion. He exhibits no edema.  Lymphadenopathy:    He has no cervical adenopathy.  Neurological: He is alert and oriented to person, place, and time.  Skin: Skin is warm and dry. No rash noted.  Psychiatric: He has a normal mood and affect. His behavior is normal. Judgment and thought content normal.  Nursing note and vitals reviewed.  BP 136/89 mmHg  Pulse 92  Temp(Src) 97.3 F (36.3 C) (Oral)  Ht 5' 9"  (1.753 m)  Wt 189 lb (85.73 kg)  BMI 27.90 kg/m2  The preliminary results on the dipstick urine had no leukocytes or nitrites. The rapid flu and rapid strep were both negative.      Assessment & Plan:  1. Fever of unknown origin -Take Tylenol and drink plenty  of fluids and get rest today. - BMP8+EGFR - CBC with Differential/Platelet - Hepatic function panel - Urine culture - Urinalysis, Complete - Rapid strep screen (not at [facility]) - Veritor Flu A/B Waived  2. Left lower quadrant pain -The patient has a history of recurring diverticulitis. He has a supply of metronidazole and Cipro at home and he will start taking this and take it for a total course of 10 days.  3. History of [facility] spotted fever -This is been treated for over 2-1/2 weeks and he will discontinue his doxycycline  Patient Instructions  Rest fluids and Tylenol Take Cipro and metronidazole as directed for 10 days Discontinued doxycycline We will call the CBC results when those results become available   Arrie Senate MD

## 2015-07-19 NOTE — Patient Instructions (Signed)
Rest fluids and Tylenol Take Cipro and metronidazole as directed for 10 days Discontinued doxycycline We will call the CBC results when those results become available

## 2015-07-20 LAB — URINE CULTURE: Organism ID, Bacteria: NO GROWTH

## 2015-08-03 ENCOUNTER — Telehealth: Payer: Self-pay | Admitting: *Deleted

## 2015-08-03 MED ORDER — CEPHALEXIN 500 MG PO CAPS: 500.0000 mg | ORAL_CAPSULE | Freq: Two times a day (BID) | ORAL | Status: DC

## 2015-08-03 NOTE — Telephone Encounter (Signed)
Pictures sent to [place] cell - pt has a tick bite - white spot on back  -- red area around it -- large red streaking around bite and up his side.  Per [facility] - call in Keflex 500 BID x 10 days  Take probiotic daily  Eat yogurt daily as well   Wife to be called with directions as well

## 2015-08-23 ENCOUNTER — Ambulatory Visit (INDEPENDENT_AMBULATORY_CARE_PROVIDER_SITE_OTHER): Payer: BLUE CROSS/BLUE SHIELD | Admitting: *Deleted

## 2015-08-23 DIAGNOSIS — L237 Allergic contact dermatitis due to plants, except food: Secondary | ICD-10-CM

## 2015-08-23 MED ORDER — METHYLPREDNISOLONE ACETATE 80 MG/ML IJ SUSP
60.0000 mg | Freq: Once | INTRAMUSCULAR | Status: AC
  Administered 2015-08-23: 60 mg via INTRAMUSCULAR

## 2015-08-23 NOTE — Progress Notes (Signed)
Pt given Depo Medrol 60mg  Ordered per Dr Laurance Flatten Pt tolerated well

## 2015-08-29 LAB — VERITOR FLU A/B WAIVED
INFLUENZA A: NEGATIVE
INFLUENZA B: NEGATIVE

## 2015-11-28 ENCOUNTER — Encounter: Payer: Self-pay | Admitting: Family Medicine

## 2015-11-28 ENCOUNTER — Ambulatory Visit (INDEPENDENT_AMBULATORY_CARE_PROVIDER_SITE_OTHER): Payer: BLUE CROSS/BLUE SHIELD | Admitting: Family Medicine

## 2015-11-28 VITALS — BP 143/92 | HR 72 | Temp 98.5°F | Ht 69.0 in | Wt 182.0 lb

## 2015-11-28 DIAGNOSIS — Z23 Encounter for immunization: Secondary | ICD-10-CM

## 2015-11-28 DIAGNOSIS — R1030 Lower abdominal pain, unspecified: Secondary | ICD-10-CM

## 2015-11-28 DIAGNOSIS — I10 Essential (primary) hypertension: Secondary | ICD-10-CM | POA: Diagnosis not present

## 2015-11-28 LAB — URINALYSIS, COMPLETE
Bilirubin, UA: NEGATIVE
Glucose, UA: NEGATIVE
KETONES UA: NEGATIVE
Leukocytes, UA: NEGATIVE
NITRITE UA: NEGATIVE
Protein, UA: NEGATIVE
RBC UA: NEGATIVE
SPEC GRAV UA: 1.015 (ref 1.005–1.030)
UUROB: 0.2 mg/dL (ref 0.2–1.0)
pH, UA: 5.5 (ref 5.0–7.5)

## 2015-11-28 LAB — MICROSCOPIC EXAMINATION
Bacteria, UA: NONE SEEN
EPITHELIAL CELLS (NON RENAL): NONE SEEN /HPF (ref 0–10)
RBC MICROSCOPIC, UA: NONE SEEN /HPF (ref 0–?)
WBC UA: NONE SEEN /HPF (ref 0–?)

## 2015-11-28 NOTE — Progress Notes (Signed)
Subjective:    Patient ID: Carl Mccullough, male    DOB: 05-15-1955, 60 y.o.   MRN: [id number]  HPI Patient here today for groin pressure that started about 4-5 days ago. The patient noticed an and his wife noticed some discoloration in the testicle and scrotal area bilaterally. He called me several days ago about this. I told him he should come in and be checked. Since that time his wife says she thinks this is better. He's not had any pain any trouble passing his water. He does say thought his wife checked him last night and thought the discoloration was improved. His bowels are moving well. He does not check his blood pressure recently at home.    Patient Active Problem List   Diagnosis Date Noted  . Diverticulosis of colon without hemorrhage 01/03/2013  . Erectile dysfunction 01/03/2013  . Allergic rhinitis 01/03/2013  . Hypertension 06/17/2012  . Hyperlipidemia 06/17/2012  . Testosterone deficiency 06/17/2012   Outpatient Encounter Prescriptions as of 11/28/2015  Medication Sig  . fluticasone (FLONASE) 50 MCG/ACT nasal spray 1-2 sprays in each nostril at bedtime (Patient not taking: Reported on 11/28/2015)  . sildenafil (REVATIO) 20 MG tablet Take 2-5 tabs PRN prior to sexual activity. (Patient not taking: Reported on 11/28/2015)  . Vitamin D, Ergocalciferol, (DRISDOL) 50000 UNITS CAPS capsule Take 1 capsule (50,000 Units total) by mouth every 7 (seven) days. (Patient not taking: Reported on 11/28/2015)  . [DISCONTINUED] cephALEXin (KEFLEX) 500 MG capsule Take 1 capsule (500 mg total) by mouth 2 (two) times daily.  . [DISCONTINUED] doxycycline (VIBRA-TABS) 100 MG tablet Take 1 tablet (100 mg total) by mouth 2 (two) times daily. (Patient not taking: Reported on 07/19/2015)  . [DISCONTINUED] sildenafil (VIAGRA) 100 MG tablet Take 0.5-1 tablets (50-100 mg total) by mouth daily as needed for erectile dysfunction.   No facility-administered encounter medications on file as of 11/28/2015.         Review of Systems  Constitutional: Negative.   HENT: Negative.   Eyes: Negative.   Respiratory: Negative.   Cardiovascular: Negative.   Gastrointestinal: Negative.   Endocrine: Negative.   Genitourinary: Negative.  Negative for difficulty urinating and dysuria.       Groin pressure x 4-5 days  Musculoskeletal: Negative.   Skin: Negative.   Allergic/Immunologic: Negative.   Neurological: Negative.   Hematological: Negative.   Psychiatric/Behavioral: Negative.        Objective:   Physical Exam  Constitutional: He is oriented to person, place, and time. He appears well-developed and well-nourished. No distress.  HENT:  Head: Normocephalic and atraumatic.  Mouth/Throat: No oropharyngeal exudate.  Eyes: Conjunctivae and EOM are normal. Pupils are equal, round, and reactive to light. Right eye exhibits no discharge. Left eye exhibits no discharge. No scleral icterus.  Cardiovascular: Normal rate, regular rhythm and normal heart sounds.   No murmur heard. Pulmonary/Chest: Effort normal and breath sounds normal. No respiratory distress. He has no wheezes. He has no rales.  Abdominal: Soft. Bowel sounds are normal. He exhibits no mass. There is no tenderness. There is no rebound and no guarding.  No inguinal adenopathy  Genitourinary: Penis normal.  Genitourinary Comments: There were no inguinal hernias palpable. There were no testicular masses or lumps or discoloration noted. Genitalia appeared normal.  Musculoskeletal: Normal range of motion. He exhibits no edema.  Lymphadenopathy:    He has no cervical adenopathy.  Neurological: He is alert and oriented to person, place, and time.  Skin: Skin is warm  and dry. No rash noted.  Psychiatric: He has a normal mood and affect. His behavior is normal. Judgment and thought content normal.  Nursing note and vitals reviewed.   BP (!) 143/92 (BP Location: Left Arm)   Pulse 72   Temp 98.5 F (36.9 C) (Oral)   Ht 5\' 9"  (1.753 m)    Wt 182 lb (82.6 kg)   BMI 26.88 kg/m         Assessment & Plan:  1. Inguinal pain, unspecified laterality -There was actually no inguinal pain just discoloration which apparently has resolved. - Urinalysis, Complete - Urine culture  2. Essential hypertension -The blood pressure was elevated today and he has not been checking his blood pressures at home. He will start checking her blood pressures more regularly and let us know what those readings are doing in a couple weeks. He will watch his sodium intake more closely.  3. Encounter for immunization -Flu shot today. - Flu Vaccine QUAD 36+ mos IM  Patient Instructions  Continue to monitor Groin and return to the office if any pain develops or if any discoloration develops Monitor blood pressures more closely and watch sodium intake   Arrie Senate MD

## 2015-11-28 NOTE — Patient Instructions (Signed)
Continue to monitor Groin and return to the office if any pain develops or if any discoloration develops Monitor blood pressures more closely and watch sodium intake

## 2015-11-29 LAB — URINE CULTURE: Organism ID, Bacteria: NO GROWTH

## 2015-12-12 ENCOUNTER — Ambulatory Visit (INDEPENDENT_AMBULATORY_CARE_PROVIDER_SITE_OTHER): Payer: BLUE CROSS/BLUE SHIELD | Admitting: Family Medicine

## 2015-12-12 ENCOUNTER — Encounter: Payer: Self-pay | Admitting: Family Medicine

## 2015-12-12 VITALS — BP 136/82 | HR 83 | Temp 97.1°F | Ht 69.0 in | Wt 182.0 lb

## 2015-12-12 DIAGNOSIS — W57XXXA Bitten or stung by nonvenomous insect and other nonvenomous arthropods, initial encounter: Secondary | ICD-10-CM

## 2015-12-12 DIAGNOSIS — L570 Actinic keratosis: Secondary | ICD-10-CM

## 2015-12-12 DIAGNOSIS — N5201 Erectile dysfunction due to arterial insufficiency: Secondary | ICD-10-CM

## 2015-12-12 DIAGNOSIS — Z23 Encounter for immunization: Secondary | ICD-10-CM | POA: Diagnosis not present

## 2015-12-12 DIAGNOSIS — Z0000 Encounter for general adult medical examination without abnormal findings: Secondary | ICD-10-CM

## 2015-12-12 DIAGNOSIS — I10 Essential (primary) hypertension: Secondary | ICD-10-CM

## 2015-12-12 DIAGNOSIS — K5730 Diverticulosis of large intestine without perforation or abscess without bleeding: Secondary | ICD-10-CM

## 2015-12-12 DIAGNOSIS — E7800 Pure hypercholesterolemia, unspecified: Secondary | ICD-10-CM

## 2015-12-12 DIAGNOSIS — R053 Chronic cough: Secondary | ICD-10-CM

## 2015-12-12 DIAGNOSIS — R05 Cough: Secondary | ICD-10-CM

## 2015-12-12 LAB — URINALYSIS, COMPLETE
Bilirubin, UA: NEGATIVE
Glucose, UA: NEGATIVE
Ketones, UA: NEGATIVE
LEUKOCYTES UA: NEGATIVE
Nitrite, UA: NEGATIVE
PH UA: 5.5 (ref 5.0–7.5)
PROTEIN UA: NEGATIVE
RBC, UA: NEGATIVE
Specific Gravity, UA: <1.005 — ABNORMAL LOW (ref 1.005–1.030)
Urobilinogen, Ur: 0.2 mg/dL (ref 0.2–1.0)

## 2015-12-12 LAB — MICROSCOPIC EXAMINATION
BACTERIA UA: NONE SEEN
EPITHELIAL CELLS (NON RENAL): NONE SEEN /HPF (ref 0–10)
RBC MICROSCOPIC, UA: NONE SEEN /HPF (ref 0–?)
WBC UA: NONE SEEN /HPF (ref 0–?)

## 2015-12-12 MED ORDER — HYDROCOD POLST-CPM POLST ER 10-8 MG/5ML PO SUER: 5.0000 mL | Freq: Every evening | ORAL | 0 refills | Status: DC | PRN

## 2015-12-12 MED ORDER — BENZONATATE 100 MG PO CAPS: 100.0000 mg | ORAL_CAPSULE | Freq: Two times a day (BID) | ORAL | 0 refills | Status: DC | PRN

## 2015-12-12 NOTE — Patient Instructions (Addendum)
Continue current medications. Continue good therapeutic lifestyle changes which include good diet and exercise. Fall precautions discussed with patient. If an FOBT was given today- please return it to our front desk. If you are over 62 years old - you may need Prevnar 74 or the adult Pneumonia vaccine.  **Flu shots are available--- please call and schedule a FLU-CLINIC appointment**  After your visit with [place] today you will receive a survey in the mail or online from [employer] regarding your care with [place]. Please take a moment to fill this out. Your feedback is very important to [place] as you can help [place] better understand your patient needs as well as improve your experience and satisfaction. WE CARE ABOUT YOU!!!   Patient should continue to monitor his blood pressures at home and make every effort to watch his sodium intake and keep his weight down. If he persists with the cough, he should take Mucinex plain maximum strength 1 twice daily with a large glass of water He should drink plenty of fluids and stay well hydrated

## 2015-12-12 NOTE — Progress Notes (Signed)
Subjective:    Patient ID: Carl Mccullough, male    DOB: 12/18/1955, 60 y.o.   MRN: [id number]  HPI  Patient is here today for annual wellness exam and follow up of chronic medical problems which includes hyperlipidemia and hypertension. He is taking medications regularly.The patient has had a history of hypertension in the past but in the past he also lost weight and watch his sodium intake and was able to come off of the medicine. He's been checking his blood pressure more frequently at home and the numbers have been within a reasonable range. He will continue to do this. He also had been seen recently for some scrotal discomfort and this has gotten better. The patient has a history of frequent tick bites and has been positive in the past for [facility] spotted fever. The patient denies any chest pain or shortness of breath. He does have this ongoing cough which seems to come on every fall and last through the winter. He just started back on his Flonase recently. He denies any problems with heartburn indigestion nausea vomiting diarrhea or blood in the stool. He does have a history of diverticulosis and tries to avoid seedy type foods. He's passing his water without problems no burning pain frequency or blood in the urine.    Patient Active Problem List   Diagnosis Date Noted  . Diverticulosis of colon without hemorrhage 01/03/2013  . Erectile dysfunction 01/03/2013  . Allergic rhinitis 01/03/2013  . Hypertension 06/17/2012  . Hyperlipidemia 06/17/2012  . Testosterone deficiency 06/17/2012   Outpatient Encounter Prescriptions as of 12/12/2015  Medication Sig  . fluticasone (FLONASE) 50 MCG/ACT nasal spray 1-2 sprays in each nostril at bedtime  . sildenafil (REVATIO) 20 MG tablet Take 2-5 tabs PRN prior to sexual activity.  . Vitamin D, Ergocalciferol, (DRISDOL) 50000 UNITS CAPS capsule Take 1 capsule (50,000 Units total) by mouth every 7 (seven) days.   No facility-administered  encounter medications on file as of 12/12/2015.      Review of Systems  Constitutional: Negative.   HENT: Negative.   Eyes: Negative.   Respiratory: Negative.   Cardiovascular: Negative.   Gastrointestinal: Negative.   Endocrine: Negative.   Genitourinary: Negative.   Musculoskeletal: Negative.   Skin: Negative.   Allergic/Immunologic: Negative.   Neurological: Negative.   Hematological: Negative.   Psychiatric/Behavioral: Negative.        Objective:   Physical Exam  Constitutional: He is oriented to person, place, and time. He appears well-developed and well-nourished. No distress.  Pleasant and alert  HENT:  Head: Normocephalic and atraumatic.  Right Ear: External ear normal.  Left Ear: External ear normal.  Mouth/Throat: Oropharynx is clear and moist. No oropharyngeal exudate.  Slight nasal congestion bilaterally  Eyes: Conjunctivae and EOM are normal. Pupils are equal, round, and reactive to light. Right eye exhibits no discharge. Left eye exhibits no discharge. No scleral icterus.  Patient is due for an eye exam and we'll make sure we get a copy of that report  Neck: Normal range of motion. Neck supple. No thyromegaly present.  No thyromegaly anterior cervical adenopathy or bruit  Cardiovascular: Normal rate, regular rhythm, normal heart sounds and intact distal pulses.  Exam reveals no friction rub.   No murmur heard. The heart has a regular rate and rhythm at 72/m  Pulmonary/Chest: Effort normal and breath sounds normal. No respiratory distress. He has no wheezes. He has no rales. He exhibits no tenderness.  Clear anteriorly and posteriorly. Dry  cough. No congestion rhonchi or rales  Abdominal: Soft. Bowel sounds are normal. He exhibits no mass. There is no tenderness. There is no rebound and no guarding.  No liver or spleen enlargement no epigastric tenderness no suprapubic tenderness and no bruits and no inguinal adenopathy  Genitourinary: Rectum normal and penis  normal.  Genitourinary Comments: There is no inguinal nodes. The external genitalia were within normal limits.  Musculoskeletal: Normal range of motion. He exhibits no edema.  Lymphadenopathy:    He has no cervical adenopathy.  Neurological: He is alert and oriented to person, place, and time. He has normal reflexes. No cranial nerve deficit.  Skin: Skin is warm and dry. Rash noted.  Patient has a couple of actinic keratoses in the scalp and cryotherapy was performed on these today and the patient tolerated the procedure well. He was informed that if they persist beyond the cryotherapy that we should be made aware of this and we will have them removed. The patient also has what appears to be insect bites on the left forearm and wrist.  Psychiatric: He has a normal mood and affect. His behavior is normal. Judgment and thought content normal.  Nursing note and vitals reviewed.  BP 136/82 (BP Location: Left Arm)   Pulse 83   Temp 97.1 F (36.2 C) (Oral)   Ht _0  (1.753 m)   Wt 182 lb (82.6 kg)   BMI 26.88 kg/m   EKG will be done today and the patient will be scheduled for a routine stress test here in the office      Assessment & Plan:  1. Annual physical exam -The physical exam was basically within normal limits today his blood pressure is well-controlled without medication. It is borderline but we will not start any medicine unless the systolic gets above 40. He will continue to watch his sodium intake and keep his weight down. He will try to eat healthy. -We will schedule him for an ETT in the office because of risk factors - Urinalysis, Complete - CBC with Differential/Platelet - Hepatic function panel - BMP8+EGFR - VITAMIN D 25 Hydroxy (Vit-D Deficiency, Fractures) - Lipid panel - PSA, total and free - EKG 12-Lead  2. Pure hypercholesterolemia -Continue with aggressive therapeutic lifestyle changes - CBC with Differential/Platelet - Lipid panel - EKG 12-Lead  3.  Essential hypertension -Continue to remain off a blood pressure medication as long as blood pressure readings stay less than 140/90. - CBC with Differential/Platelet - Hepatic function panel - BMP8+EGFR - EKG 12-Lead  4. Diverticulosis of colon without hemorrhage -Avoid irritating foods  5. Erectile dysfunction due to arterial insufficiency -Continue with current treatment  6. Insect bite, initial encounter -Cortisone 10 as needed  7. Multiple actinic keratoses -Cryotherapy in the office.  8. Persistent cough -Try Tessalon pearls and if no relief with this tussionex  Patient Instructions  Continue current medications. Continue good therapeutic lifestyle changes which include good diet and exercise. Fall precautions discussed with patient. If an FOBT was given today- please return it to our front desk. If you are over 26 years old - you may need Prevnar 24 or the adult Pneumonia vaccine.  **Flu shots are available--- please call and schedule a FLU-CLINIC appointment**  After your visit with [place] today you will receive a survey in the mail or online from [employer] regarding your care with [place]. Please take a moment to fill this out. Your feedback is very important to [place] as you can help [place]  better understand your patient needs as well as improve your experience and satisfaction. WE CARE ABOUT YOU!!!   Patient should continue to monitor his blood pressures at home and make every effort to watch his sodium intake and keep his weight down. If he persists with the cough, he should take Mucinex plain maximum strength 1 twice daily with a large glass of water He should drink plenty of fluids and stay well hydrated    Arrie Senate MD

## 2015-12-13 LAB — LIPID PANEL
Chol/HDL Ratio: 2.1 ratio units (ref 0.0–5.0)
Cholesterol, Total: 174 mg/dL (ref 100–199)
HDL: 84 mg/dL (ref >39)
LDL Calculated: 74 mg/dL (ref 0–99)
TRIGLYCERIDES: 80 mg/dL (ref 0–149)
VLDL Cholesterol Cal: 16 mg/dL (ref 5–40)

## 2015-12-13 LAB — PSA, TOTAL AND FREE
PSA, Free Pct: 42.5 %
PSA, Free: 0.34 ng/mL
Prostate Specific Ag, Serum: 0.8 ng/mL (ref 0.0–4.0)

## 2015-12-13 LAB — BMP8+EGFR
BUN/Creatinine Ratio: 10 (ref 10–24)
BUN: 9 mg/dL (ref 8–27)
CALCIUM: 9.8 mg/dL (ref 8.6–10.2)
CHLORIDE: 98 mmol/L (ref 96–106)
CO2: 25 mmol/L (ref 18–29)
Creatinine, Ser: 0.9 mg/dL (ref 0.76–1.27)
GFR calc non Af Amer: 91 mL/min/{1.73_m2} (ref >59)
GFR, EST AFRICAN AMERICAN: 105 mL/min/{1.73_m2} (ref >59)
GLUCOSE: 86 mg/dL (ref 65–99)
Potassium: 5.2 mmol/L (ref 3.5–5.2)
Sodium: 138 mmol/L (ref 134–144)

## 2015-12-13 LAB — CBC WITH DIFFERENTIAL/PLATELET
BASOS ABS: 0.1 10*3/uL (ref 0.0–0.2)
BASOS: 1 %
EOS (ABSOLUTE): 0.1 10*3/uL (ref 0.0–0.4)
EOS: 2 %
HEMATOCRIT: 44 % (ref 37.5–51.0)
HEMOGLOBIN: 15.4 g/dL (ref 12.6–17.7)
IMMATURE GRANS (ABS): 0 10*3/uL (ref 0.0–0.1)
Immature Granulocytes: 0 %
LYMPHS ABS: 2.3 10*3/uL (ref 0.7–3.1)
LYMPHS: 36 %
MCH: 32.7 pg (ref 26.6–33.0)
MCHC: 35 g/dL (ref 31.5–35.7)
MCV: 93 fL (ref 79–97)
MONOCYTES: 8 %
Monocytes Absolute: 0.5 10*3/uL (ref 0.1–0.9)
NEUTROS ABS: 3.3 10*3/uL (ref 1.4–7.0)
Neutrophils: 53 %
Platelets: 278 10*3/uL (ref 150–379)
RBC: 4.71 x10E6/uL (ref 4.14–5.80)
RDW: 13.4 % (ref 12.3–15.4)
WBC: 6.3 10*3/uL (ref 3.4–10.8)

## 2015-12-13 LAB — HEPATIC FUNCTION PANEL
ALK PHOS: 94 IU/L (ref 39–117)
ALT: 19 IU/L (ref 0–44)
AST: 22 IU/L (ref 0–40)
Albumin: 4.7 g/dL (ref 3.6–4.8)
BILIRUBIN, DIRECT: 0.08 mg/dL (ref 0.00–0.40)
Bilirubin Total: 0.2 mg/dL (ref 0.0–1.2)
TOTAL PROTEIN: 7.6 g/dL (ref 6.0–8.5)

## 2015-12-13 LAB — VITAMIN D 25 HYDROXY (VIT D DEFICIENCY, FRACTURES): VIT D 25 HYDROXY: 20 ng/mL — AB (ref 30.0–100.0)

## 2015-12-25 ENCOUNTER — Other Ambulatory Visit: Payer: Self-pay | Admitting: Family Medicine

## 2015-12-25 DIAGNOSIS — J302 Other seasonal allergic rhinitis: Secondary | ICD-10-CM

## 2016-05-01 ENCOUNTER — Encounter: Payer: Self-pay | Admitting: Family Medicine

## 2016-05-01 ENCOUNTER — Ambulatory Visit (INDEPENDENT_AMBULATORY_CARE_PROVIDER_SITE_OTHER): Payer: BLUE CROSS/BLUE SHIELD | Admitting: Family Medicine

## 2016-05-01 VITALS — BP 134/100 | HR 71 | Temp 97.3°F | Ht 69.0 in | Wt 184.0 lb

## 2016-05-01 DIAGNOSIS — L989 Disorder of the skin and subcutaneous tissue, unspecified: Secondary | ICD-10-CM

## 2016-05-01 DIAGNOSIS — I10 Essential (primary) hypertension: Secondary | ICD-10-CM | POA: Diagnosis not present

## 2016-05-01 DIAGNOSIS — L820 Inflamed seborrheic keratosis: Secondary | ICD-10-CM

## 2016-05-01 NOTE — Progress Notes (Signed)
Subjective:    Patient ID: Carl Mccullough, male    DOB: Oct 29, 1955, 61 y.o.   MRN: [id number]  HPI Patient here today for several skin lesions on his chest and right thigh. The area on the right medial thigh has come up fairly quickly but looks again like a seborrheic keratosis. There are 2 areas on the chest. The patient has not been checking his blood pressure readings at home.   Patient Active Problem List   Diagnosis Date Noted  . Diverticulosis of colon without hemorrhage 01/03/2013  . Erectile dysfunction 01/03/2013  . Allergic rhinitis 01/03/2013  . Hypertension 06/17/2012  . Hyperlipidemia 06/17/2012  . Testosterone deficiency 06/17/2012   Outpatient Encounter Prescriptions as of 05/01/2016  Medication Sig  . fluticasone (FLONASE) 50 MCG/ACT nasal spray 1-2 SPRAYS IN EACH NOSTRIL AT BEDTIME  . Vitamin D, Ergocalciferol, (DRISDOL) 50000 UNITS CAPS capsule Take 1 capsule (50,000 Units total) by mouth every 7 (seven) days.  . sildenafil (REVATIO) 20 MG tablet Take 2-5 tabs PRN prior to sexual activity.  . [DISCONTINUED] benzonatate (TESSALON) 100 MG capsule Take 1 capsule (100 mg total) by mouth 2 (two) times daily as needed for cough.  . [DISCONTINUED] chlorpheniramine-HYDROcodone (TUSSIONEX) 10-8 MG/5ML SUER Take 5 mLs by mouth at bedtime as needed for cough.   No facility-administered encounter medications on file as of 05/01/2016.       Review of Systems  Constitutional: Negative.   HENT: Negative.   Eyes: Negative.   Respiratory: Negative.   Cardiovascular: Negative.   Gastrointestinal: Negative.   Endocrine: Negative.   Genitourinary: Negative.   Musculoskeletal: Negative.   Skin: Negative.        Several skin lesions - chest = getting darker 1 lesion on right thigh  Allergic/Immunologic: Negative.   Neurological: Negative.   Hematological: Negative.   Psychiatric/Behavioral: Negative.        Objective:   Physical Exam  Constitutional: He is oriented to  person, place, and time. He appears well-developed and well-nourished. No distress.  HENT:  Head: Normocephalic and atraumatic.  Nose: Nose normal.  Mouth/Throat: Oropharynx is clear and moist. No oropharyngeal exudate.  Minimal nasal congestion. The patient is concerned about some asymmetry of the left side of his nose compared to the right and we will continue to monitor this.  Eyes: Conjunctivae and EOM are normal. Pupils are equal, round, and reactive to light. Right eye exhibits no discharge. Left eye exhibits no discharge. No scleral icterus.  Neck: Normal range of motion. Neck supple. No thyromegaly present.  Cardiovascular: Normal rate, regular rhythm and normal heart sounds.   No murmur heard. The heart is 60/m with a regular rate and rhythm  Pulmonary/Chest: Effort normal and breath sounds normal. No respiratory distress. He has no wheezes. He has no rales.  Musculoskeletal: Normal range of motion. He exhibits no edema.  Lymphadenopathy:    He has no cervical adenopathy.  Neurological: He is alert and oriented to person, place, and time.  Skin: Skin is warm and dry. No rash noted.  2 chest lesions which appear to be more seborrheic keratotic in nature. There is one to light are colored lesion on the right medial thigh which we will have him come back and exercise. We will do cryotherapy on the to chest wall lesions. The patient tolerated the procedure well.  Psychiatric: He has a normal mood and affect. His behavior is normal. Judgment and thought content normal.  Nursing note and vitals reviewed.   BP Marland Kitchen)  141/89 (BP Location: Right Arm)   Pulse 71   Temp 97.3 F (36.3 C) (Oral)   Ht 5\' 9"  (1.753 m)   Wt 184 lb (83.5 kg)   BMI 27.17 kg/m        Assessment & Plan:  1. Skin lesion of chest wall -Cryotherapy to 2 chest wall lesions that have been irritating.  2. Elevated blood pressure reading with diagnosis of hypertension -The patient will watch his sodium intake more  closely and we'll recheck blood pressure readings at home and bring these back for review when we removed the lesion on his right medial thigh  Patient Instructions  Return to clinic for excision and removal of lesion right thigh Check blood pressures more regularly at home and watch sodium intake  Arrie Senate MD

## 2016-05-01 NOTE — Patient Instructions (Signed)
Return to clinic for excision and removal of lesion right thigh Check blood pressures more regularly at home and watch sodium intake

## 2016-05-14 ENCOUNTER — Encounter: Payer: Self-pay | Admitting: Family Medicine

## 2016-05-14 ENCOUNTER — Ambulatory Visit (INDEPENDENT_AMBULATORY_CARE_PROVIDER_SITE_OTHER): Payer: BLUE CROSS/BLUE SHIELD | Admitting: Family Medicine

## 2016-05-14 VITALS — BP 159/112 | HR 85 | Temp 98.5°F | Ht 69.0 in | Wt 184.0 lb

## 2016-05-14 DIAGNOSIS — D489 Neoplasm of uncertain behavior, unspecified: Secondary | ICD-10-CM | POA: Diagnosis not present

## 2016-05-14 DIAGNOSIS — I10 Essential (primary) hypertension: Secondary | ICD-10-CM

## 2016-05-14 NOTE — Patient Instructions (Addendum)
Keep the lesion covered with some Vaseline for 24-48 hours and then apply a Band-Aid over this. After 24-36 hours she may take a bath pat the area dry and reapply the Band-Aid with the Vaseline. Return to the office if there is any problems with increasing redness or irritation. Monitor blood pressures at home.

## 2016-05-14 NOTE — Progress Notes (Signed)
   Subjective:    Patient ID: Carl Mccullough, male    DOB: November 14, 1955, 61 y.o.   MRN: [id number]  HPI  Patient here today for skin lesion removal of the right inner thigh.This is a suspicious lesion and it has come up quickly. Is also irritating the patient somewhat because of the being rubbed by his clothing.    Patient Active Problem List   Diagnosis Date Noted  . Diverticulosis of colon without hemorrhage 01/03/2013  . Erectile dysfunction 01/03/2013  . Allergic rhinitis 01/03/2013  . Hypertension 06/17/2012  . Hyperlipidemia 06/17/2012  . Testosterone deficiency 06/17/2012   Outpatient Encounter Prescriptions as of 05/14/2016  Medication Sig  . fluticasone (FLONASE) 50 MCG/ACT nasal spray 1-2 SPRAYS IN EACH NOSTRIL AT BEDTIME  . sildenafil (REVATIO) 20 MG tablet Take 2-5 tabs PRN prior to sexual activity.  . Vitamin D, Ergocalciferol, (DRISDOL) 50000 UNITS CAPS capsule Take 1 capsule (50,000 Units total) by mouth every 7 (seven) days.   No facility-administered encounter medications on file as of 05/14/2016.      Review of Systems  Constitutional: Negative.   HENT: Negative.   Eyes: Negative.   Respiratory: Negative.   Cardiovascular: Negative.   Gastrointestinal: Negative.   Endocrine: Negative.   Genitourinary: Negative.   Musculoskeletal: Negative.   Skin: Negative.        Lesion of right inner - upper thigh  Allergic/Immunologic: Negative.   Neurological: Negative.   Hematological: Negative.   Psychiatric/Behavioral: Negative.        Objective:   Physical Exam  Constitutional: He appears well-developed and well-nourished.  Skin: Skin is warm and dry.  Irregular raised lesion about 0.25" x 3 86 inches in size. Xylocaine was instilled after thorough cleansing and the lesion was shaved off and sent for pathology. Hyfrecation was done to control the bleeding and the patient tolerated the procedure well.  Psychiatric: He has a normal mood and affect. His behavior  is normal. Judgment and thought content normal.   BP (!) 159/112   Pulse 85   Temp 98.5 F (36.9 C) (Oral)   Ht 5\' 9"  (1.753 m)   Wt 184 lb (83.5 kg)   BMI 27.17 kg/m         Assessment & Plan:  1. Neoplasm of uncertain behavior -The lesion was cleansed and Xylocaine with epi was infused and a shave excision was done in hyfrecation was done following this. The patient tolerated the procedure well. The lesion will be sent off for pathology for further evaluation. -He will keep a Band-Aid over the lesion with some Vaseline covering the lesion itself and after 24-36 hours he can go ahead and take a bath or shower but keep the Band-Aid over the lesion for the next week or so.  2.Hypertension -The blood pressure was elevated today and the patient said he did have more salt intake with his diet recently but that in general his blood pressures at home have been good and we will make sure we follow up with this. The patient indicates that his blood pressures at home have been good.  Patient Instructions  Keep the lesion covered with some Vaseline for 24-48 hours and then apply a Band-Aid over this. After 24-36 hours she may take a bath pat the area dry and reapply the Band-Aid with the Vaseline. Return to the office if there is any problems with increasing redness or irritation.  Arrie Senate MD

## 2016-05-14 NOTE — Addendum Note (Signed)
Addended by: Zannie Cove on: 05/14/2016 12:33 PM   Modules accepted: Orders

## 2016-05-16 LAB — PATHOLOGY

## 2016-05-21 ENCOUNTER — Encounter: Payer: Self-pay | Admitting: Family Medicine

## 2016-05-21 ENCOUNTER — Ambulatory Visit (INDEPENDENT_AMBULATORY_CARE_PROVIDER_SITE_OTHER): Payer: BLUE CROSS/BLUE SHIELD | Admitting: Family Medicine

## 2016-05-21 VITALS — BP 135/89 | HR 76 | Temp 97.0°F | Ht 69.0 in | Wt 184.0 lb

## 2016-05-21 DIAGNOSIS — L989 Disorder of the skin and subcutaneous tissue, unspecified: Secondary | ICD-10-CM | POA: Diagnosis not present

## 2016-05-21 DIAGNOSIS — I10 Essential (primary) hypertension: Secondary | ICD-10-CM | POA: Diagnosis not present

## 2016-05-21 DIAGNOSIS — D489 Neoplasm of uncertain behavior, unspecified: Secondary | ICD-10-CM

## 2016-05-21 NOTE — Progress Notes (Signed)
Subjective:    Patient ID: Carl Mccullough, male    DOB: [birth year], 61 y.o.   MRN: [id number]  HPI  Patient here today for follow up on skin lesion removal and to review results.The patient returns today for recheck of excision site from removal of skin lesion from the right upper medial thigh. The pathology report will be reviewed with the patient. He will be given a copy of this. It turned out to be a benign keratosis and did not have microscopic features of HPV. The pathologist however did say that this could be an indolent lesion with condyloma acuminata. I will give the patient copy of the report.  Patient Active Problem List   Diagnosis Date Noted  . Diverticulosis of colon without hemorrhage 01/03/2013  . Erectile dysfunction 01/03/2013  . Allergic rhinitis 01/03/2013  . Hypertension 06/17/2012  . Hyperlipidemia 06/17/2012  . Testosterone deficiency 06/17/2012   Outpatient Encounter Prescriptions as of 05/21/2016  Medication Sig  . fluticasone (FLONASE) 50 MCG/ACT nasal spray 1-2 SPRAYS IN EACH NOSTRIL AT BEDTIME  . sildenafil (REVATIO) 20 MG tablet Take 2-5 tabs PRN prior to sexual activity.  . Vitamin D, Ergocalciferol, (DRISDOL) 50000 UNITS CAPS capsule Take 1 capsule (50,000 Units total) by mouth every 7 (seven) days.   No facility-administered encounter medications on file as of 05/21/2016.      Review of Systems  Constitutional: Negative.   HENT: Negative.   Eyes: Negative.   Respiratory: Negative.   Cardiovascular: Negative.   Gastrointestinal: Negative.   Endocrine: Negative.   Genitourinary: Negative.   Musculoskeletal: Negative.   Skin: Negative.   Allergic/Immunologic: Negative.   Neurological: Negative.   Hematological: Negative.   Psychiatric/Behavioral: Negative.        Objective:   Physical Exam  Constitutional: He is oriented to person, place, and time. He appears well-developed and well-nourished. No distress.  Musculoskeletal: Normal range of  motion.  Neurological: He is alert and oriented to person, place, and time.  Skin: Skin is warm and dry. No rash noted. There is erythema.  The excision site appears to be healing as expected and the patient will continue to keep cleansing it and applying Vaseline to the lesion. There is another skin lesion that he is concerned about to the right of his nose where the pad of his glasses rest. There is an oval area here. We will get the dermatologist to look at this for further evaluation and possible biopsy.  Psychiatric: He has a normal mood and affect. His behavior is normal. Judgment and thought content normal.    BP (!) 141/94 (BP Location: Right Arm)   Pulse 76   Temp 97 F (36.1 C) (Oral)   Ht 5\' 9"  (1.753 m)   Wt 184 lb (83.5 kg)   BMI 27.17 kg/m   The pathology report was reviewed with the patient he was given a copy of this and was reminded if any further lesions come up in the pelvic area to get back in touch with [place] so that we can look at these.     Assessment & Plan:  1. Skin lesion of face - Ambulatory referral to Dermatology  2. Elevated blood pressure reading with diagnosis of hypertension -repeat blood pressure was good - The patient will continue to monitor readings at home and watch sodium intake.  3. Neoplasm of uncertain behavior -the excision site is healing well. The pathology report was reviewed with the patient. This turned out  A benign keratosis  The patient was reminded that if any further lesions occur in the pelvic area to get back in touch with [place].  4. Essential hypertension -repeat blood pressure was good but higher on the diastolic side. - The blood pressure will be monitored at home by the patient he will bring readings for review - He will continue to watch sodium intake.  Patient Instructions  Continue to apply Vaseline to the excision site. We will arrange for a visit to the dermatologist for evaluation of the skin lesion that is to the right of  the nose medial to the right eye.  Arrie Senate MD

## 2016-05-21 NOTE — Patient Instructions (Addendum)
Continue to apply Vaseline to the excision site. We will arrange for a visit to the dermatologist for evaluation of the skin lesion that is to the right of the nose medial to the right eye. Monitor blood pressures more regularly and bring the readings for review Watch sodium intake

## 2016-06-18 ENCOUNTER — Ambulatory Visit (INDEPENDENT_AMBULATORY_CARE_PROVIDER_SITE_OTHER): Payer: BLUE CROSS/BLUE SHIELD | Admitting: Family Medicine

## 2016-06-18 ENCOUNTER — Encounter: Payer: Self-pay | Admitting: Family Medicine

## 2016-06-18 VITALS — BP 131/82 | HR 81 | Temp 97.7°F | Ht 69.0 in | Wt 184.0 lb

## 2016-06-18 DIAGNOSIS — E559 Vitamin D deficiency, unspecified: Secondary | ICD-10-CM

## 2016-06-18 DIAGNOSIS — E7800 Pure hypercholesterolemia, unspecified: Secondary | ICD-10-CM | POA: Diagnosis not present

## 2016-06-18 DIAGNOSIS — I10 Essential (primary) hypertension: Secondary | ICD-10-CM

## 2016-06-18 DIAGNOSIS — L989 Disorder of the skin and subcutaneous tissue, unspecified: Secondary | ICD-10-CM

## 2016-06-18 DIAGNOSIS — W57XXXD Bitten or stung by nonvenomous insect and other nonvenomous arthropods, subsequent encounter: Secondary | ICD-10-CM

## 2016-06-18 NOTE — Patient Instructions (Addendum)
Continue current medications. Continue good therapeutic lifestyle changes which include good diet and exercise. Fall precautions discussed with patient. If an FOBT was given today- please return it to our front desk. If you are over 63 years old - you may need Prevnar 14 or the adult Pneumonia vaccine.  **Flu shots are available--- please call and schedule a FLU-CLINIC appointment**  After your visit with [place] today you will receive a survey in the mail or online from [employer] regarding your care with [place]. Please take a moment to fill this out. Your feedback is very important to [place] as you can help [place] better understand your patient needs as well as improve your experience and satisfaction. WE CARE ABOUT YOU!!!   The patient will follow-up with the dermatologist as planned for the nasal lesion He'll do everything that he can to keep the ticks off of his body and check himself regularly He will stay on the doxycycline He should continue to monitor the blood pressures at home and try to keep them at 1:30 or less and somewhere in the 80 range or less. He will continue to watch sodium intake He will need his next colonoscopy by Dr. Amedeo Plenty in January 2019 because of the positive family history in his father for colon cancer

## 2016-06-18 NOTE — Progress Notes (Signed)
Subjective:    Patient ID: Carl Mccullough, male    DOB: May 27, 1955, 61 y.o.   MRN: [id number]  HPI Pt here for follow up and management of chronic medical problems which includes hyperlipidemia and hypertension. He is taking medication regularly.The patient is doing well today with no specific complaints. His vital signs are stable and his blood pressure is good and he is had an elevated blood pressure recently but this one is much improved today. He still taking doxycycline for a tick bite. He will either get lab work today or return to the office fasting for this. We will look at the lesion that was removed from his right thigh today. The pathology report from this lesion showed a benign keratosis. He has some microscopic features of a possible active human papilloma virus infection that could not be excluded and should be included in the possibility of an indolent lesion of condyloma acuminatum. The patient has had multiple tick bites and just took 2 more off of him recently. He is on doxycycline and he will continue to stay on that. He denies any chest pain or shortness of breath. He denies any trouble with his stomach including nausea vomiting diarrhea blood in the stool or black tarry bowel movements. He did have a colonoscopy in January 2014. His father had colon cancer. He will need another colonoscopy in January 2019. He's passing his water without problems. He has an appointment with the dermatologist that we made for a nasal lesion that is coming up toward the end of this month.     Patient Active Problem List   Diagnosis Date Noted  . Diverticulosis of colon without hemorrhage 01/03/2013  . Erectile dysfunction 01/03/2013  . Allergic rhinitis 01/03/2013  . Hypertension 06/17/2012  . Hyperlipidemia 06/17/2012  . Testosterone deficiency 06/17/2012   Outpatient Encounter Prescriptions as of 06/18/2016  Medication Sig  . fluticasone (FLONASE) 50 MCG/ACT nasal spray 1-2 SPRAYS IN EACH  NOSTRIL AT BEDTIME  . sildenafil (REVATIO) 20 MG tablet Take 2-5 tabs PRN prior to sexual activity.  . Vitamin D, Ergocalciferol, (DRISDOL) 50000 UNITS CAPS capsule Take 1 capsule (50,000 Units total) by mouth every 7 (seven) days.  Marland Kitchen doxycycline (VIBRAMYCIN) 100 MG capsule    No facility-administered encounter medications on file as of 06/18/2016.       Review of Systems  Constitutional: Negative.   HENT: Negative.   Eyes: Negative.   Respiratory: Negative.   Cardiovascular: Negative.   Gastrointestinal: Negative.   Endocrine: Negative.   Genitourinary: Negative.   Musculoskeletal: Negative.   Skin: Negative.   Allergic/Immunologic: Negative.   Neurological: Negative.   Hematological: Negative.   Psychiatric/Behavioral: Negative.        Objective:   Physical Exam  Constitutional: He is oriented to person, place, and time. He appears well-developed and well-nourished. No distress.  HENT:  Head: Normocephalic and atraumatic.  Right Ear: External ear normal.  Left Ear: External ear normal.  Nose: Nose normal.  Mouth/Throat: Oropharynx is clear and moist. No oropharyngeal exudate.  Eyes: Conjunctivae and EOM are normal. Pupils are equal, round, and reactive to light. Right eye exhibits no discharge. Left eye exhibits no discharge. No scleral icterus.  Neck: Normal range of motion. Neck supple. No thyromegaly present.  Cardiovascular: Normal rate, regular rhythm, normal heart sounds and intact distal pulses.   No murmur heard. Pulmonary/Chest: Effort normal and breath sounds normal. No respiratory distress. He has no wheezes. He has no rales. He exhibits no tenderness.  Clear anteriorly and posteriorly no axillary adenopathy  Abdominal: Soft. Bowel sounds are normal. He exhibits no mass. There is no tenderness. There is no rebound and no guarding.  No abdominal tenderness masses bruits or inguinal adenopathy  Musculoskeletal: Normal range of motion. He exhibits no edema.    Lymphadenopathy:    He has no cervical adenopathy.  Neurological: He is alert and oriented to person, place, and time. He has normal reflexes. No cranial nerve deficit.  Skin: Skin is warm and dry. No rash noted.  A couple of tick bites on the left thigh. The lesion on the right thigh appears to be healing well from the previous skin excision.  Psychiatric: He has a normal mood and affect. His behavior is normal. Judgment and thought content normal.  Nursing note and vitals reviewed.  BP 131/82   Pulse 81   Temp 97.7 F (36.5 C) (Oral)   Ht 5\' 9"  (1.753 m)   Wt 184 lb (83.5 kg)   BMI 27.17 kg/m         Assessment & Plan:    1. Pure hypercholesterolemia -Continue with aggressive therapeutic lifestyle changes  2. Essential hypertension -Watch sodium intake, keep weight down, drink plenty of water, check blood pressure readings at home regularly.  3. Tick bite, subsequent encounter -Continue doxycycline 100 mg twice daily  4. External nasal lesion -Follow-up with dermatology as planned  Patient Instructions  Continue current medications. Continue good therapeutic lifestyle changes which include good diet and exercise. Fall precautions discussed with patient. If an FOBT was given today- please return it to our front desk. If you are over 66 years old - you may need Prevnar 61 or the adult Pneumonia vaccine.  **Flu shots are available--- please call and schedule a FLU-CLINIC appointment**  After your visit with [place] today you will receive a survey in the mail or online from [employer] regarding your care with [place]. Please take a moment to fill this out. Your feedback is very important to [place] as you can help [place] better understand your patient needs as well as improve your experience and satisfaction. WE CARE ABOUT YOU!!!   The patient will follow-up with the dermatologist as planned for the nasal lesion He'll do everything that he can to keep the ticks off of his body and check  himself regularly He will stay on the doxycycline He should continue to monitor the blood pressures at home and try to keep them at 1:30 or less and somewhere in the 80 range or less. He will continue to watch sodium intake He will need his next colonoscopy by Dr. Amedeo Plenty in January 2019 because of the positive family history in his father for colon cancer    Arrie Senate MD

## 2016-06-21 ENCOUNTER — Other Ambulatory Visit: Payer: BLUE CROSS/BLUE SHIELD

## 2016-06-21 DIAGNOSIS — E559 Vitamin D deficiency, unspecified: Secondary | ICD-10-CM

## 2016-06-21 DIAGNOSIS — I10 Essential (primary) hypertension: Secondary | ICD-10-CM

## 2016-06-21 DIAGNOSIS — E7800 Pure hypercholesterolemia, unspecified: Secondary | ICD-10-CM

## 2016-06-21 DIAGNOSIS — L989 Disorder of the skin and subcutaneous tissue, unspecified: Secondary | ICD-10-CM

## 2016-06-21 DIAGNOSIS — W57XXXD Bitten or stung by nonvenomous insect and other nonvenomous arthropods, subsequent encounter: Secondary | ICD-10-CM

## 2016-06-23 LAB — BMP8+EGFR
BUN / CREAT RATIO: 11 (ref 10–24)
BUN: 11 mg/dL (ref 8–27)
CO2: 23 mmol/L (ref 18–29)
CREATININE: 0.99 mg/dL (ref 0.76–1.27)
Calcium: 9.3 mg/dL (ref 8.6–10.2)
Chloride: 99 mmol/L (ref 96–106)
GFR calc Af Amer: 94 mL/min/{1.73_m2} (ref >59)
GFR calc non Af Amer: 81 mL/min/{1.73_m2} (ref >59)
GLUCOSE: 88 mg/dL (ref 65–99)
Potassium: 5.3 mmol/L — ABNORMAL HIGH (ref 3.5–5.2)
Sodium: 137 mmol/L (ref 134–144)

## 2016-06-23 LAB — CBC WITH DIFFERENTIAL/PLATELET
BASOS: 1 %
Basophils Absolute: 0 10*3/uL (ref 0.0–0.2)
EOS (ABSOLUTE): 0.2 10*3/uL (ref 0.0–0.4)
EOS: 4 %
HEMATOCRIT: 42.8 % (ref 37.5–51.0)
HEMOGLOBIN: 14.7 g/dL (ref 13.0–17.7)
IMMATURE GRANS (ABS): 0 10*3/uL (ref 0.0–0.1)
Immature Granulocytes: 0 %
LYMPHS: 38 %
Lymphocytes Absolute: 2.3 10*3/uL (ref 0.7–3.1)
MCH: 32.6 pg (ref 26.6–33.0)
MCHC: 34.3 g/dL (ref 31.5–35.7)
MCV: 95 fL (ref 79–97)
MONOCYTES: 8 %
Monocytes Absolute: 0.5 10*3/uL (ref 0.1–0.9)
Neutrophils Absolute: 3.1 10*3/uL (ref 1.4–7.0)
Neutrophils: 49 %
Platelets: 269 10*3/uL (ref 150–379)
RBC: 4.51 x10E6/uL (ref 4.14–5.80)
RDW: 13.6 % (ref 12.3–15.4)
WBC: 6.1 10*3/uL (ref 3.4–10.8)

## 2016-06-23 LAB — HEPATIC FUNCTION PANEL
ALBUMIN: 4.3 g/dL (ref 3.6–4.8)
ALK PHOS: 88 IU/L (ref 39–117)
ALT: 19 IU/L (ref 0–44)
AST: 23 IU/L (ref 0–40)
BILIRUBIN TOTAL: 0.4 mg/dL (ref 0.0–1.2)
Bilirubin, Direct: 0.13 mg/dL (ref 0.00–0.40)
Total Protein: 6.8 g/dL (ref 6.0–8.5)

## 2016-06-23 LAB — LIPID PANEL
CHOLESTEROL TOTAL: 156 mg/dL (ref 100–199)
Chol/HDL Ratio: 2 ratio (ref 0.0–5.0)
HDL: 78 mg/dL (ref >39)
LDL CALC: 67 mg/dL (ref 0–99)
Triglycerides: 54 mg/dL (ref 0–149)
VLDL CHOLESTEROL CAL: 11 mg/dL (ref 5–40)

## 2016-06-23 LAB — VITAMIN D 25 HYDROXY (VIT D DEFICIENCY, FRACTURES): Vit D, 25-Hydroxy: 22.8 ng/mL — ABNORMAL LOW (ref 30.0–100.0)

## 2016-06-25 LAB — SPECIMEN STATUS REPORT

## 2016-06-25 LAB — THYROID PANEL WITH TSH
FREE THYROXINE INDEX: 1.9 (ref 1.2–4.9)
T3 UPTAKE RATIO: 29 % (ref 24–39)
T4, Total: 6.4 ug/dL (ref 4.5–12.0)
TSH: 3.92 u[IU]/mL (ref 0.450–4.500)

## 2016-08-16 ENCOUNTER — Encounter: Payer: Self-pay | Admitting: Nurse Practitioner

## 2016-08-16 ENCOUNTER — Ambulatory Visit (INDEPENDENT_AMBULATORY_CARE_PROVIDER_SITE_OTHER): Payer: BLUE CROSS/BLUE SHIELD | Admitting: Nurse Practitioner

## 2016-08-16 VITALS — BP 133/88 | HR 85 | Temp 98.2°F | Ht 69.0 in | Wt 190.0 lb

## 2016-08-16 DIAGNOSIS — L237 Allergic contact dermatitis due to plants, except food: Secondary | ICD-10-CM | POA: Diagnosis not present

## 2016-08-16 MED ORDER — METHYLPREDNISOLONE ACETATE 80 MG/ML IJ SUSP
80.0000 mg | Freq: Once | INTRAMUSCULAR | Status: AC
  Administered 2016-08-16: 80 mg via INTRAMUSCULAR

## 2016-08-16 NOTE — Progress Notes (Signed)
   Subjective:    Patient ID: Carl Mccullough, male    DOB: 19-Jan-1956, 61 y.o.   MRN: [id number]  HPI Patient has been doing a lot of outside yard work and notoced that his right eyelids were starting to itch. This morning eye swollen shut.   Review of Systems  Constitutional: Negative.   Respiratory: Negative.   Cardiovascular: Negative.   Skin: Positive for rash (right eyelids).  All other systems reviewed and are negative.      Objective:   Physical Exam  Constitutional: He is oriented to person, place, and time. He appears well-developed and well-nourished. No distress.  Eyes: Conjunctivae are normal. Pupils are equal, round, and reactive to light.  Right upper and lower lid edema   Cardiovascular: Normal rate, regular rhythm and normal heart sounds.   Pulmonary/Chest: Effort normal and breath sounds normal.  Neurological: He is alert and oriented to person, place, and time.  Skin: Skin is warm.  Psychiatric: He has a normal mood and affect. His behavior is normal. Judgment and thought content normal.   BP 133/88 (BP Location: Right Arm, Cuff Size: Large)   Pulse 85   Temp 98.2 F (36.8 C) (Oral)   Ht 5\' 9"  (1.753 m)   Wt 190 lb (86.2 kg)   BMI 28.06 kg/m       Assessment & Plan:   1. Allergic contact dermatitis due to plants, except food    Meds ordered this encounter  Medications  . methylPREDNISolone acetate (DEPO-MEDROL) injection 80 mg   Cool compresses Avoid scratching RTO prn  Mary-Margaret Hassell Done, FNP

## 2016-08-16 NOTE — Patient Instructions (Signed)
Poison Ivy Dermatitis Poison ivy dermatitis is redness and soreness (inflammation) of the skin. It is caused by a chemical that is found on the leaves of the poison ivy plant. You may also have itching, a rash, and blisters. Symptoms often clear up in 1-2 weeks. You may get this condition by touching a poison ivy plant. You can also get it by touching something that has the chemical on it. This may include animals or objects that have come in contact with the plant. Follow these instructions at home: General instructions  Take or apply over-the-counter and prescription medicines only as told by your doctor.  If you touch poison ivy, wash your skin with soap and cold water right away.  Use hydrocortisone creams or calamine lotion as needed to help with itching.  Take oatmeal baths as needed. Use colloidal oatmeal. You can get this at a pharmacy or grocery store. Follow the instructions on the package.  Do not scratch or rub your skin.  While you have the rash, wash your clothes right after you wear them. Prevention  Know what poison ivy looks like so you can avoid it. This plant has three leaves with flowering branches on a single stem. The leaves are glossy. They have uneven edges that come to a point at the front.  If you have touched poison ivy, wash with soap and water right away. Be sure to wash under your fingernails.  When hiking or camping, wear long pants, a long-sleeved shirt, tall socks, and hiking boots. You can also use a lotion on your skin that helps to prevent contact with the chemical on the plant.  If you think that your clothes or outdoor gear came in contact with poison ivy, rinse them off with a garden hose before you bring them inside your house. Contact a doctor if:  You have open sores in the rash area.  You have more redness, swelling, or pain in the affected area.  You have redness that spreads beyond the rash area.  You have fluid, blood, or pus coming from  the affected area.  You have a fever.  You have a rash over a large area of your body.  You have a rash on your eyes, mouth, or genitals.  Your rash does not get better after a few days. Get help right away if:  Your face swells or your eyes swell shut.  You have trouble breathing.  You have trouble swallowing. This information is not intended to replace advice given to you by your health care provider. Make sure you discuss any questions you have with your health care provider. Document Released: 03/08/2010 Document Revised: 07/12/2015 Document Reviewed: 07/12/2014 Elsevier Interactive Patient Education  2018 [employer]  

## 2016-12-19 ENCOUNTER — Encounter: Payer: Self-pay | Admitting: Family Medicine

## 2016-12-19 ENCOUNTER — Ambulatory Visit (INDEPENDENT_AMBULATORY_CARE_PROVIDER_SITE_OTHER): Payer: BLUE CROSS/BLUE SHIELD

## 2016-12-19 ENCOUNTER — Ambulatory Visit (INDEPENDENT_AMBULATORY_CARE_PROVIDER_SITE_OTHER): Payer: BLUE CROSS/BLUE SHIELD | Admitting: Family Medicine

## 2016-12-19 VITALS — BP 142/88 | HR 81 | Temp 97.6°F | Ht 69.0 in | Wt 188.0 lb

## 2016-12-19 DIAGNOSIS — E7800 Pure hypercholesterolemia, unspecified: Secondary | ICD-10-CM

## 2016-12-19 DIAGNOSIS — J029 Acute pharyngitis, unspecified: Secondary | ICD-10-CM

## 2016-12-19 DIAGNOSIS — N5201 Erectile dysfunction due to arterial insufficiency: Secondary | ICD-10-CM

## 2016-12-19 DIAGNOSIS — Z23 Encounter for immunization: Secondary | ICD-10-CM

## 2016-12-19 DIAGNOSIS — Z8052 Family history of malignant neoplasm of bladder: Secondary | ICD-10-CM

## 2016-12-19 DIAGNOSIS — E559 Vitamin D deficiency, unspecified: Secondary | ICD-10-CM | POA: Insufficient documentation

## 2016-12-19 DIAGNOSIS — I10 Essential (primary) hypertension: Secondary | ICD-10-CM

## 2016-12-19 DIAGNOSIS — N400 Enlarged prostate without lower urinary tract symptoms: Secondary | ICD-10-CM | POA: Insufficient documentation

## 2016-12-19 DIAGNOSIS — Z85038 Personal history of other malignant neoplasm of large intestine: Secondary | ICD-10-CM

## 2016-12-19 DIAGNOSIS — Z0000 Encounter for general adult medical examination without abnormal findings: Secondary | ICD-10-CM | POA: Diagnosis not present

## 2016-12-19 LAB — MICROSCOPIC EXAMINATION
Bacteria, UA: NONE SEEN
EPITHELIAL CELLS (NON RENAL): NONE SEEN /HPF (ref 0–10)
RBC, UA: NONE SEEN /hpf (ref 0–?)
Renal Epithel, UA: NONE SEEN /hpf
WBC, UA: NONE SEEN /hpf (ref 0–?)

## 2016-12-19 LAB — URINALYSIS, COMPLETE
BILIRUBIN UA: NEGATIVE
GLUCOSE, UA: NEGATIVE
KETONES UA: NEGATIVE
Leukocytes, UA: NEGATIVE
NITRITE UA: NEGATIVE
PROTEIN UA: NEGATIVE
RBC UA: NEGATIVE
Specific Gravity, UA: 1.01 (ref 1.005–1.030)
UUROB: 0.2 mg/dL (ref 0.2–1.0)
pH, UA: 6 (ref 5.0–7.5)

## 2016-12-19 LAB — RAPID STREP SCREEN (MED CTR MEBANE ONLY): STREP GP A AG, IA W/REFLEX: NEGATIVE

## 2016-12-19 LAB — CULTURE, GROUP A STREP

## 2016-12-19 MED ORDER — MONTELUKAST SODIUM 10 MG PO TABS: 10.0000 mg | ORAL_TABLET | Freq: Every day | ORAL | 3 refills | Status: DC

## 2016-12-19 MED ORDER — AZITHROMYCIN 250 MG PO TABS: ORAL_TABLET | ORAL | 0 refills | Status: DC

## 2016-12-19 MED ORDER — SILDENAFIL CITRATE 20 MG PO TABS: ORAL_TABLET | ORAL | 11 refills | Status: DC

## 2016-12-19 NOTE — Addendum Note (Signed)
Addended by: Zannie Cove on: 12/19/2016 02:02 PM   Modules accepted: Orders

## 2016-12-19 NOTE — Progress Notes (Signed)
Subjective:    Patient ID: Carl Mccullough, male    DOB: 1955/08/19, 61 y.o.   MRN: [id number]  HPI Patient is here today for annual wellness exam and follow up of chronic medical problems which includes hyperlipidemia. He is taking medication regularly.  The patient continues to have hoarseness sore throat and congestion with ear pain.  This is been going on for several weeks.  I know this patient and have seen him outside the office and we have tried symptomatic treatment with nasal saline and Mucinex and this has not helped any.  He has not been running any fever or coughing up any purulent sputum.  He is up-to-date on his colonoscopy and is not due another one until January 2019 which is coming up soon.  We will do a rapid strep test today.  Patient's family history was reviewed today.  His father died of colon cancer his mother died of Alzheimer's.  This hoarseness sore throat and congestion as I said have been going on for several weeks.  He denies any fever sputum production.  He denies any trouble with chest pain or shortness of breath.  He denies any problems with swallowing heartburn indigestion nausea vomiting diarrhea or blood in the stool.  He is passing his water well and having no problems with that.  He feels like there is just a lot of congestion in his head and throat area and there is soreness from the throat up to both ears.  On health maintenance issues his next colonoscopy is due and 2019.  He will check with his insurance on the new shingles shot.     Patient Active Problem List   Diagnosis Date Noted  . Diverticulosis of colon without hemorrhage 01/03/2013  . Erectile dysfunction 01/03/2013  . Allergic rhinitis 01/03/2013  . Hypertension 06/17/2012  . Hyperlipidemia 06/17/2012  . Testosterone deficiency 06/17/2012   Outpatient Encounter Prescriptions as of 12/19/2016  Medication Sig  . fluticasone (FLONASE) 50 MCG/ACT nasal spray 1-2 SPRAYS IN EACH NOSTRIL AT BEDTIME  .  sildenafil (REVATIO) 20 MG tablet Take 2-5 tabs PRN prior to sexual activity.  . Vitamin D, Ergocalciferol, (DRISDOL) 50000 UNITS CAPS capsule Take 1 capsule (50,000 Units total) by mouth every 7 (seven) days.  . [DISCONTINUED] doxycycline (VIBRAMYCIN) 100 MG capsule    No facility-administered encounter medications on file as of 12/19/2016.      Review of Systems  Constitutional: Negative.   HENT: Positive for congestion, ear pain, sore throat and voice change (hoarse).   Eyes: Negative.   Respiratory: Negative.   Cardiovascular: Negative.   Gastrointestinal: Negative.   Endocrine: Negative.   Genitourinary: Negative.   Musculoskeletal: Negative.   Skin: Negative.   Allergic/Immunologic: Negative.   Neurological: Negative.   Hematological: Negative.   Psychiatric/Behavioral: Negative.        Objective:   Physical Exam  Constitutional: He is oriented to person, place, and time. He appears well-developed and well-nourished. No distress.  The patient is pleasant and relaxed  HENT:  Head: Normocephalic and atraumatic.  Right Ear: External ear normal.  Left Ear: External ear normal.  Nose: Nose normal.  Mouth/Throat: Oropharynx is clear and moist. No oropharyngeal exudate.  TMs are clear and nasal passages with minimal congestion with minimal redness posterior throat  Eyes: Pupils are equal, round, and reactive to light. Conjunctivae and EOM are normal. Right eye exhibits no discharge. Left eye exhibits no discharge. No scleral icterus.  Neck: Normal range of motion. Neck  supple. No thyromegaly present.  No anterior cervical adenopathy and no thyromegaly  Cardiovascular: Normal rate, regular rhythm, normal heart sounds and intact distal pulses.   No murmur heard. Heart is regular at 72/min  Pulmonary/Chest: Effort normal and breath sounds normal. No respiratory distress. He has no wheezes. He has no rales. He exhibits no tenderness.  No axillary adenopathy clear anteriorly and  posteriorly with no increased congestion with coughing  Abdominal: Soft. Bowel sounds are normal. He exhibits no mass. There is no tenderness. There is no rebound and no guarding.  No liver or spleen enlargement no bruits no masses and no inguinal adenopathy  Genitourinary: Rectum normal and penis normal.  Genitourinary Comments: The prostate is slightly enlarged with no lumps or masses.  There were no rectal masses.  External genitalia were within normal limits with no inguinal hernias being palpated.  Musculoskeletal: Normal range of motion. He exhibits no edema.  Lymphadenopathy:    He has no cervical adenopathy.  Neurological: He is alert and oriented to person, place, and time. He has normal reflexes. No cranial nerve deficit.  Skin: Skin is warm and dry. No rash noted.  Psychiatric: He has a normal mood and affect. His behavior is normal. Judgment and thought content normal.  Nursing note and vitals reviewed.  BP (!) 142/88 (BP Location: Left Arm)   Pulse 81   Temp 97.6 F (36.4 C) (Oral)   Ht 5' 9"  (1.753 m)   Wt 188 lb (85.3 kg)   BMI 27.76 kg/m         Assessment & Plan:  1. Annual physical exam -The patient will be due a colonoscopy early next year because of his father's history of colon cancer - CBC with Differential/Platelet - BMP8+EGFR - Lipid panel - Hepatic function panel - VITAMIN D 25 Hydroxy (Vit-D Deficiency, Fractures) - PSA, total and free - Thyroid Panel With TSH - Urinalysis, Complete - Hepatitis C antibody  2. Essential hypertension -The blood pressure is elevated today.  The patient has gained some weight and will watch his sodium intake more closely and will make sure that these numbers come down and recheck the blood pressure in the next 2-4 weeks. - DG Chest 2 View; Future - CBC with Differential/Platelet - BMP8+EGFR - Hepatic function panel  3. Pure hypercholesterolemia -Continue aggressive therapeutic lifestyle changes which include diet  and exercise pending results of lab work - [employer] 2 View; Future - CBC with Differential/Platelet - Lipid panel  4. Vitamin D deficiency -Continue with vitamin D replacement pending results of lab work - CBC with Differential/Platelet - VITAMIN D 25 Hydroxy (Vit-D Deficiency, Fractures)  5. Sore throat -Rapid strep test in the office today was negative. - Upper Respiratory Culture, Routine  6. Erectile dysfunction due to arterial insufficiency -Refilled generic Viagra 20 mg and take 1-5 daily as needed  7. Need for immunization against influenza - Flu Vaccine QUAD 36+ mos IM  8. Benign prostatic hyperplasia without lower urinary tract symptoms -The prostate was only mildly enlarged and is causing no symptoms.  9. History of colon cancer -The patient is due a colonoscopy early next year.  10.  Family history of bladder cancer -Patient should have urinalysis at least once or twice yearly.  Meds ordered this encounter  Medications  . montelukast (SINGULAIR) 10 MG tablet    Sig: Take 1 tablet (10 mg total) by mouth at bedtime.    Dispense:  30 tablet    Refill:  3  .  azithromycin (ZITHROMAX) 250 MG tablet    Sig: As directed    Dispense:  6 tablet    Refill:  0   Patient Instructions  Continue current medications. Continue good therapeutic lifestyle changes which include good diet and exercise. Fall precautions discussed with patient. If an FOBT was given today- please return it to our front desk. If you are over 93 years old - you may need Prevnar 39 or the adult Pneumonia vaccine.  **Flu shots are available--- please call and schedule a FLU-CLINIC appointment**  After your visit with [place] today you will receive a survey in the mail or online from [employer] regarding your care with [place]. Please take a moment to fill this out. Your feedback is very important to [place] as you can help [place] better understand your patient needs as well as improve your experience and satisfaction.  WE CARE ABOUT YOU!!!   Will call with lab work results as soon as these results become available Continue to take the Claritin every morning Continue to use the Flonase at night Continue to use the nasal saline spray during the day Add to this regimen Singulair 1 daily at nighttime To keep the house cooler and use cool mist humidification in the room but she spent the most time and at home Take the Z-Pak as directed If you are not better in 10 days we will do a short course of prednisone and if you do not get better with that we will send you to the ear nose and throat specialist Watch sodium intake and make every effort to lose a few pounds of weight and report blood pressure readings back to [place] in a couple of weeks   Arrie Senate MD

## 2016-12-19 NOTE — Patient Instructions (Addendum)
Continue current medications. Continue good therapeutic lifestyle changes which include good diet and exercise. Fall precautions discussed with patient. If an FOBT was given today- please return it to our front desk. If you are over 63 years old - you may need Prevnar 69 or the adult Pneumonia vaccine.  **Flu shots are available--- please call and schedule a FLU-CLINIC appointment**  After your visit with [place] today you will receive a survey in the mail or online from [employer] regarding your care with [place]. Please take a moment to fill this out. Your feedback is very important to [place] as you can help [place] better understand your patient needs as well as improve your experience and satisfaction. WE CARE ABOUT YOU!!!   Will call with lab work results as soon as these results become available Continue to take the Claritin every morning Continue to use the Flonase at night Continue to use the nasal saline spray during the day Add to this regimen Singulair 1 daily at nighttime To keep the house cooler and use cool mist humidification in the room but she spent the most time and at home Take the Z-Pak as directed If you are not better in 10 days we will do a short course of prednisone and if you do not get better with that we will send you to the ear nose and throat specialist Watch sodium intake and make every effort to lose a few pounds of weight and report blood pressure readings back to [place] in a couple of weeks

## 2016-12-20 LAB — THYROID PANEL WITH TSH
FREE THYROXINE INDEX: 1.8 (ref 1.2–4.9)
T3 UPTAKE RATIO: 26 % (ref 24–39)
T4, Total: 7.1 ug/dL (ref 4.5–12.0)
TSH: 3.05 u[IU]/mL (ref 0.450–4.500)

## 2016-12-20 LAB — CBC WITH DIFFERENTIAL/PLATELET
BASOS ABS: 0 10*3/uL (ref 0.0–0.2)
BASOS: 1 %
EOS (ABSOLUTE): 0.1 10*3/uL (ref 0.0–0.4)
Eos: 2 %
HEMOGLOBIN: 15.3 g/dL (ref 13.0–17.7)
Hematocrit: 43.7 % (ref 37.5–51.0)
IMMATURE GRANS (ABS): 0 10*3/uL (ref 0.0–0.1)
Immature Granulocytes: 0 %
LYMPHS ABS: 1.9 10*3/uL (ref 0.7–3.1)
LYMPHS: 37 %
MCH: 32.8 pg (ref 26.6–33.0)
MCHC: 35 g/dL (ref 31.5–35.7)
MCV: 94 fL (ref 79–97)
MONOCYTES: 8 %
Monocytes Absolute: 0.4 10*3/uL (ref 0.1–0.9)
NEUTROS ABS: 2.8 10*3/uL (ref 1.4–7.0)
Neutrophils: 52 %
Platelets: 268 10*3/uL (ref 150–379)
RBC: 4.67 x10E6/uL (ref 4.14–5.80)
RDW: 13.3 % (ref 12.3–15.4)
WBC: 5.3 10*3/uL (ref 3.4–10.8)

## 2016-12-20 LAB — BMP8+EGFR
BUN / CREAT RATIO: 11 (ref 10–24)
BUN: 11 mg/dL (ref 8–27)
CO2: 25 mmol/L (ref 20–29)
CREATININE: 1.01 mg/dL (ref 0.76–1.27)
Calcium: 10 mg/dL (ref 8.6–10.2)
Chloride: 99 mmol/L (ref 96–106)
GFR, EST AFRICAN AMERICAN: 91 mL/min/{1.73_m2} (ref >59)
GFR, EST NON AFRICAN AMERICAN: 79 mL/min/{1.73_m2} (ref >59)
GLUCOSE: 83 mg/dL (ref 65–99)
Potassium: 5.1 mmol/L (ref 3.5–5.2)
SODIUM: 139 mmol/L (ref 134–144)

## 2016-12-20 LAB — PSA, TOTAL AND FREE
PROSTATE SPECIFIC AG, SERUM: 1 ng/mL (ref 0.0–4.0)
PSA FREE: 0.38 ng/mL
PSA, Free Pct: 38 %

## 2016-12-20 LAB — LIPID PANEL
CHOL/HDL RATIO: 2.4 ratio (ref 0.0–5.0)
Cholesterol, Total: 162 mg/dL (ref 100–199)
HDL: 67 mg/dL (ref >39)
LDL CALC: 81 mg/dL (ref 0–99)
Triglycerides: 69 mg/dL (ref 0–149)
VLDL CHOLESTEROL CAL: 14 mg/dL (ref 5–40)

## 2016-12-20 LAB — HEPATIC FUNCTION PANEL
ALBUMIN: 4.8 g/dL (ref 3.6–4.8)
ALT: 20 IU/L (ref 0–44)
AST: 20 IU/L (ref 0–40)
Alkaline Phosphatase: 88 IU/L (ref 39–117)
BILIRUBIN TOTAL: 0.2 mg/dL (ref 0.0–1.2)
Bilirubin, Direct: 0.1 mg/dL (ref 0.00–0.40)
TOTAL PROTEIN: 7.5 g/dL (ref 6.0–8.5)

## 2016-12-20 LAB — HEPATITIS C ANTIBODY

## 2016-12-20 LAB — VITAMIN D 25 HYDROXY (VIT D DEFICIENCY, FRACTURES): Vit D, 25-Hydroxy: 27.7 ng/mL — ABNORMAL LOW (ref 30.0–100.0)

## 2016-12-21 LAB — UPPER RESPIRATORY CULTURE, ROUTINE

## 2017-01-07 ENCOUNTER — Other Ambulatory Visit: Payer: Self-pay

## 2017-01-07 DIAGNOSIS — J029 Acute pharyngitis, unspecified: Secondary | ICD-10-CM

## 2017-01-30 ENCOUNTER — Other Ambulatory Visit: Payer: Self-pay | Admitting: Family Medicine

## 2017-01-30 DIAGNOSIS — J302 Other seasonal allergic rhinitis: Secondary | ICD-10-CM

## 2017-03-06 ENCOUNTER — Telehealth: Payer: Self-pay | Admitting: Family Medicine

## 2017-03-06 ENCOUNTER — Ambulatory Visit: Payer: BLUE CROSS/BLUE SHIELD | Admitting: Family Medicine

## 2017-03-06 ENCOUNTER — Encounter: Payer: Self-pay | Admitting: Family Medicine

## 2017-03-06 VITALS — BP 158/109 | HR 90 | Temp 97.7°F | Ht 69.0 in | Wt 190.0 lb

## 2017-03-06 DIAGNOSIS — J309 Allergic rhinitis, unspecified: Secondary | ICD-10-CM | POA: Diagnosis not present

## 2017-03-06 MED ORDER — PREDNISONE 20 MG PO TABS: ORAL_TABLET | ORAL | 0 refills | Status: DC

## 2017-03-06 NOTE — Telephone Encounter (Signed)
Patient already got answer

## 2017-03-06 NOTE — Progress Notes (Signed)
BP (!) 158/109   Pulse 90   Temp 97.7 F (36.5 C) (Oral)   Ht 5\' 9"  (1.753 m)   Wt 190 lb (86.2 kg)   BMI 28.06 kg/m    Subjective:    Patient ID: Carl Mccullough, male    DOB: Jun 03, 1955, 62 y.o.   MRN: [id number]  HPI: Carl Mccullough is a 62 y.o. male presenting on 03/06/2017 for Light headed, dizziness, cough, nasal congestion (began having dizziness yesterday after having a bad coughing spell, has been taking Mucinex DM)   HPI Sinus congestion and headache and dizziness Patient comes in complaining of headache and dizziness and cough and nasal congestion and a woozy sensation in his head that is been going on over the past couple days.  He has a chronic cough and gets recurrent allergies but mainly he is coming in for the increased sinus pressure and the dizziness is been going on.  He denies any fevers or chills or shortness of breath or wheezing.  He has been using Mucinex DM and cough lozenges which have been helping some but he is most concerned about the dizziness.  Relevant past medical, surgical, family and social history reviewed and updated as indicated. Interim medical history since our last visit reviewed. Allergies and medications reviewed and updated.  Review of Systems  Constitutional: Positive for fever. Negative for chills.  HENT: Positive for congestion, postnasal drip, rhinorrhea, sinus pressure, sneezing and sore throat. Negative for ear discharge, ear pain and voice change.   Eyes: Negative for pain, discharge, redness and visual disturbance.  Respiratory: Positive for cough. Negative for shortness of breath and wheezing.   Cardiovascular: Negative for chest pain and leg swelling.  Musculoskeletal: Negative for gait problem.  Skin: Negative for rash.  All other systems reviewed and are negative.   Per HPI unless specifically indicated above        Objective:    BP (!) 158/109   Pulse 90   Temp 97.7 F (36.5 C) (Oral)   Ht 5\' 9"  (1.753 m)   Wt  190 lb (86.2 kg)   BMI 28.06 kg/m   Wt Readings from Last 3 Encounters:  03/06/17 190 lb (86.2 kg)  12/19/16 188 lb (85.3 kg)  08/16/16 190 lb (86.2 kg)    Physical Exam  Constitutional: He is oriented to person, place, and time. He appears well-developed and well-nourished. No distress.  HENT:  Right Ear: Tympanic membrane, external ear and ear canal normal.  Left Ear: Tympanic membrane, external ear and ear canal normal.  Nose: Mucosal edema and rhinorrhea present. No sinus tenderness. No epistaxis. Right sinus exhibits maxillary sinus tenderness. Right sinus exhibits no frontal sinus tenderness. Left sinus exhibits maxillary sinus tenderness. Left sinus exhibits no frontal sinus tenderness.  Mouth/Throat: Uvula is midline and mucous membranes are normal. Posterior oropharyngeal edema and posterior oropharyngeal erythema present. No oropharyngeal exudate or tonsillar abscesses.  Eyes: Conjunctivae and EOM are normal. Pupils are equal, round, and reactive to light. No scleral icterus.  Neck: Neck supple. No thyromegaly present.  Cardiovascular: Normal rate, regular rhythm, normal heart sounds and intact distal pulses.  No murmur heard. Pulmonary/Chest: Effort normal and breath sounds normal. No respiratory distress. He has no wheezes. He has no rales.  Musculoskeletal: Normal range of motion. He exhibits no edema.  Lymphadenopathy:    He has no cervical adenopathy.  Neurological: He is alert and oriented to person, place, and time. Coordination normal.  Skin: Skin is warm and  dry. No rash noted. He is not diaphoretic.  Psychiatric: He has a normal mood and affect. His behavior is normal.  Nursing note and vitals reviewed.       Assessment & Plan:   Problem List Items Addressed This Visit    None    Visit Diagnoses    Allergic sinusitis    -  Primary   Relevant Medications   predniSONE (DELTASONE) 20 MG tablet       Follow up plan: Return if symptoms worsen or fail to  improve.  Counseling provided for all of the vaccine components No orders of the defined types were placed in this encounter.   Caryl Pina, MD [facility] 03/06/2017, 3:09 PM

## 2017-03-06 NOTE — Telephone Encounter (Signed)
appt scheduled Pt's wife notified 

## 2017-03-26 ENCOUNTER — Other Ambulatory Visit: Payer: Self-pay | Admitting: *Deleted

## 2017-03-26 MED ORDER — ALBUTEROL SULFATE HFA 108 (90 BASE) MCG/ACT IN AERS
1.0000 | INHALATION_SPRAY | Freq: Four times a day (QID) | RESPIRATORY_TRACT | 1 refills | Status: DC | PRN
Start: 2017-03-26 — End: 2018-12-10

## 2017-05-08 ENCOUNTER — Other Ambulatory Visit: Payer: Self-pay | Admitting: *Deleted

## 2017-05-08 ENCOUNTER — Ambulatory Visit: Payer: BLUE CROSS/BLUE SHIELD | Admitting: Family Medicine

## 2017-05-08 ENCOUNTER — Encounter: Payer: Self-pay | Admitting: Family Medicine

## 2017-05-08 ENCOUNTER — Encounter: Payer: Self-pay | Admitting: *Deleted

## 2017-05-08 VITALS — BP 124/84 | HR 114 | Temp 102.0°F | Ht 69.0 in | Wt 192.0 lb

## 2017-05-08 DIAGNOSIS — R6889 Other general symptoms and signs: Secondary | ICD-10-CM

## 2017-05-08 DIAGNOSIS — R070 Pain in throat: Secondary | ICD-10-CM

## 2017-05-08 DIAGNOSIS — R3915 Urgency of urination: Secondary | ICD-10-CM

## 2017-05-08 LAB — URINALYSIS, COMPLETE
Bilirubin, UA: NEGATIVE
Glucose, UA: NEGATIVE
Leukocytes, UA: NEGATIVE
Nitrite, UA: NEGATIVE
PH UA: 6 (ref 5.0–7.5)
RBC, UA: NEGATIVE
Specific Gravity, UA: 1.025 (ref 1.005–1.030)
UUROB: 1 mg/dL (ref 0.2–1.0)

## 2017-05-08 LAB — VERITOR FLU A/B WAIVED
INFLUENZA A: POSITIVE — AB
Influenza B: NEGATIVE

## 2017-05-08 LAB — MICROSCOPIC EXAMINATION
Bacteria, UA: NONE SEEN
RBC, UA: NONE SEEN /hpf (ref 0–2)

## 2017-05-08 LAB — CULTURE, GROUP A STREP

## 2017-05-08 LAB — RAPID STREP SCREEN (MED CTR MEBANE ONLY): Strep Gp A Ag, IA W/Reflex: NEGATIVE

## 2017-05-08 MED ORDER — OSELTAMIVIR PHOSPHATE 75 MG PO CAPS: 75.0000 mg | ORAL_CAPSULE | Freq: Two times a day (BID) | ORAL | 0 refills | Status: DC

## 2017-05-08 NOTE — Addendum Note (Signed)
Addended by: Zannie Cove on: 05/08/2017 02:50 PM   Modules accepted: Orders

## 2017-05-08 NOTE — Addendum Note (Signed)
Addended by: Zannie Cove on: 05/08/2017 04:01 PM   Modules accepted: Orders

## 2017-05-08 NOTE — Patient Instructions (Addendum)
Take Tylenol every 4-6 hours for aches pains and fever Drink plenty of fluids and stay well-hydrated Avoid milk cheese ice cream dairy products Avoid acidic drinks 7-Up ginger ale and Sprite would be fine to drink.  Small amounts of fluids but frequently. Use inhaler if needed for wheezing Use Mucinex if needed for cough and congestion Take Tamiflu regularly twice daily until completed Remain out of work at least through the weekend and possibly through Monday. Rest

## 2017-05-08 NOTE — Progress Notes (Addendum)
Subjective:    Patient ID: Carl Mccullough, male    DOB: 02/14/1956, 62 y.o.   MRN: [id number]  HPI Patient here today for flu like symptoms and urinary urgency.  The patient called last evening and with his symptoms we did call in some Tamiflu and he did not take the first pill until today.  He went to work feeling bad and running a fever.  I did scold him for this and he should know better.  He has had a cough but it is nonproductive his throat has been scratchy and sore also.  He is not had any intestinal symptoms or trouble with nausea or vomiting.    Patient Active Problem List   Diagnosis Date Noted  . History of colon cancer 12/19/2016  . Benign prostatic hyperplasia without lower urinary tract symptoms 12/19/2016  . Vitamin D deficiency 12/19/2016  . Family history of bladder cancer 12/19/2016  . Diverticulosis of colon without hemorrhage 01/03/2013  . Erectile dysfunction 01/03/2013  . Allergic rhinitis 01/03/2013  . Hypertension 06/17/2012  . Hyperlipidemia 06/17/2012  . Testosterone deficiency 06/17/2012   Outpatient Encounter Medications as of 05/08/2017  Medication Sig  . albuterol (PROVENTIL HFA;VENTOLIN HFA) 108 (90 Base) MCG/ACT inhaler Inhale 1-2 puffs into the lungs every 6 (six) hours as needed for wheezing or shortness of breath.  . fluticasone (FLONASE) 50 MCG/ACT nasal spray INSTILL 1-2 SPRAYS IN EACH NOSTRIL AT BEDTIME  . montelukast (SINGULAIR) 10 MG tablet Take 1 tablet (10 mg total) by mouth at bedtime.  Marland Kitchen oseltamivir (TAMIFLU) 75 MG capsule Take 1 capsule (75 mg total) by mouth 2 (two) times daily.  . sildenafil (REVATIO) 20 MG tablet Take 2-5 tabs PRN prior to sexual activity.  . Vitamin D, Ergocalciferol, (DRISDOL) 50000 UNITS CAPS capsule Take 1 capsule (50,000 Units total) by mouth every 7 (seven) days.  . [DISCONTINUED] azithromycin (ZITHROMAX) 250 MG tablet As directed  . [DISCONTINUED] predniSONE (DELTASONE) 20 MG tablet 2 po at same time daily for 5  days   No facility-administered encounter medications on file as of 05/08/2017.       Review of Systems  Constitutional: Positive for chills, fatigue and fever.  HENT: Positive for congestion.   Eyes: Negative.   Respiratory: Positive for cough.   Cardiovascular: Negative.   Gastrointestinal: Negative.   Endocrine: Negative.   Genitourinary: Positive for urgency.  Musculoskeletal: Positive for myalgias.  Skin: Negative.   Allergic/Immunologic: Negative.   Neurological: Negative.  Negative for dizziness and headaches.  Hematological: Negative.   Psychiatric/Behavioral: Negative.        Objective:   Physical Exam  Constitutional: He is oriented to person, place, and time. He appears well-developed and well-nourished. He appears distressed.  HENT:  Head: Normocephalic and atraumatic.  Right Ear: External ear normal.  Left Ear: External ear normal.  Nose: Nose normal.  Mouth/Throat: No oropharyngeal exudate.  Throat is red posteriorly and bilaterally  Eyes: Pupils are equal, round, and reactive to light. Conjunctivae and EOM are normal. Right eye exhibits no discharge. Left eye exhibits no discharge. No scleral icterus.  Neck: Normal range of motion. Neck supple. No thyromegaly present.  No bruits or thyromegaly or adenopathy  Cardiovascular: Normal rate, regular rhythm and normal heart sounds.  No murmur heard. Pulmonary/Chest: Effort normal and breath sounds normal. No respiratory distress. He has no wheezes. He has no rales.  Chest is clear anteriorly and posteriorly without rales or wheezes  Abdominal: Soft. Bowel sounds are normal. He exhibits  no mass. There is no tenderness. There is no rebound and no guarding.  No abdominal tenderness  Musculoskeletal: Normal range of motion. He exhibits no edema.  Lymphadenopathy:    He has no cervical adenopathy.  Neurological: He is alert and oriented to person, place, and time.  Skin: Skin is warm and dry. No rash noted.    Psychiatric: He has a normal mood and affect. His behavior is normal. Judgment and thought content normal.  Nursing note and vitals reviewed.   BP 124/84 (BP Location: Left Arm)   Pulse (!) 114   Temp (!) 102 F (38.9 C) (Oral)   Ht 5\' 9"  (1.753 m)   Wt 192 lb (87.1 kg)   BMI 28.35 kg/m        Assessment & Plan:  1. Flu-like symptoms--flu test was positive for type a flu. -Continue with Tamiflu - CBC with Differential/Platelet - Veritor Flu A/B Waived -Treat symptoms of cough congestion and fever with Mucinex and Tylenol -Note to remain out of work through Monday and at least 24 hours after fever is no longer present  2. Urgency of urination -Continue to drink plenty of fluids - Urine Culture - Urinalysis, Complete  3. Throat pain in adult -Rapid strep pending== the rapid strep test was negative and a throat culture is pending===  No orders of the defined types were placed in this encounter.  Continue with Tamiflu 75 mg twice daily  Patient Instructions  Take Tylenol every 4-6 hours for aches pains and fever Drink plenty of fluids and stay well-hydrated Avoid milk cheese ice cream dairy products Avoid acidic drinks 7-Up ginger ale and Sprite would be fine to drink.  Small amounts of fluids but frequently. Use inhaler if needed for wheezing Use Mucinex if needed for cough and congestion Take Tamiflu regularly twice daily until completed Remain out of work at least through the weekend and possibly through Monday. Rest  Arrie Senate MD

## 2017-05-09 LAB — CBC WITH DIFFERENTIAL/PLATELET
BASOS ABS: 0 10*3/uL (ref 0.0–0.2)
Basos: 0 %
EOS (ABSOLUTE): 0 10*3/uL (ref 0.0–0.4)
EOS: 0 %
HEMATOCRIT: 40.5 % (ref 37.5–51.0)
HEMOGLOBIN: 14.2 g/dL (ref 13.0–17.7)
IMMATURE GRANS (ABS): 0 10*3/uL (ref 0.0–0.1)
Immature Granulocytes: 0 %
LYMPHS ABS: 1 10*3/uL (ref 0.7–3.1)
LYMPHS: 10 %
MCH: 32.3 pg (ref 26.6–33.0)
MCHC: 35.1 g/dL (ref 31.5–35.7)
MCV: 92 fL (ref 79–97)
MONOCYTES: 9 %
Monocytes Absolute: 0.8 10*3/uL (ref 0.1–0.9)
Neutrophils Absolute: 7.8 10*3/uL — ABNORMAL HIGH (ref 1.4–7.0)
Neutrophils: 81 %
Platelets: 201 10*3/uL (ref 150–379)
RBC: 4.4 x10E6/uL (ref 4.14–5.80)
RDW: 13.2 % (ref 12.3–15.4)
WBC: 9.6 10*3/uL (ref 3.4–10.8)

## 2017-05-09 LAB — URINE CULTURE: Organism ID, Bacteria: NO GROWTH

## 2017-05-10 LAB — CULTURE, GROUP A STREP: STREP A CULTURE: NEGATIVE

## 2017-05-11 ENCOUNTER — Other Ambulatory Visit: Payer: Self-pay | Admitting: *Deleted

## 2017-05-11 MED ORDER — HYDROCOD POLST-CPM POLST ER 10-8 MG/5ML PO SUER: 5.0000 mL | Freq: Every evening | ORAL | 0 refills | Status: DC | PRN

## 2017-05-12 ENCOUNTER — Other Ambulatory Visit: Payer: Self-pay | Admitting: *Deleted

## 2017-05-12 ENCOUNTER — Other Ambulatory Visit: Payer: BLUE CROSS/BLUE SHIELD

## 2017-05-12 ENCOUNTER — Ambulatory Visit (INDEPENDENT_AMBULATORY_CARE_PROVIDER_SITE_OTHER): Payer: BLUE CROSS/BLUE SHIELD

## 2017-05-12 ENCOUNTER — Encounter: Payer: Self-pay | Admitting: *Deleted

## 2017-05-12 DIAGNOSIS — R509 Fever, unspecified: Secondary | ICD-10-CM

## 2017-05-12 DIAGNOSIS — R062 Wheezing: Secondary | ICD-10-CM

## 2017-05-12 DIAGNOSIS — R6889 Other general symptoms and signs: Secondary | ICD-10-CM | POA: Diagnosis not present

## 2017-05-12 MED ORDER — AZITHROMYCIN 250 MG PO TABS: ORAL_TABLET | ORAL | 0 refills | Status: DC

## 2017-06-18 ENCOUNTER — Ambulatory Visit: Payer: BLUE CROSS/BLUE SHIELD | Admitting: Family Medicine

## 2017-06-18 ENCOUNTER — Encounter: Payer: Self-pay | Admitting: Family Medicine

## 2017-06-18 ENCOUNTER — Ambulatory Visit (INDEPENDENT_AMBULATORY_CARE_PROVIDER_SITE_OTHER): Payer: BLUE CROSS/BLUE SHIELD

## 2017-06-18 VITALS — BP 123/86 | HR 82 | Temp 97.8°F | Ht 69.0 in | Wt 185.0 lb

## 2017-06-18 DIAGNOSIS — Z8701 Personal history of pneumonia (recurrent): Secondary | ICD-10-CM

## 2017-06-18 DIAGNOSIS — I10 Essential (primary) hypertension: Secondary | ICD-10-CM

## 2017-06-18 DIAGNOSIS — N400 Enlarged prostate without lower urinary tract symptoms: Secondary | ICD-10-CM | POA: Diagnosis not present

## 2017-06-18 DIAGNOSIS — Z8052 Family history of malignant neoplasm of bladder: Secondary | ICD-10-CM | POA: Diagnosis not present

## 2017-06-18 DIAGNOSIS — Z1211 Encounter for screening for malignant neoplasm of colon: Secondary | ICD-10-CM

## 2017-06-18 DIAGNOSIS — E7800 Pure hypercholesterolemia, unspecified: Secondary | ICD-10-CM | POA: Diagnosis not present

## 2017-06-18 DIAGNOSIS — E559 Vitamin D deficiency, unspecified: Secondary | ICD-10-CM | POA: Diagnosis not present

## 2017-06-18 NOTE — Progress Notes (Signed)
Subjective:    Patient ID: Carl Mccullough, male    DOB: 22-Nov-1955, 62 y.o.   MRN: [id number]  HPI Pt here for follow up and management of chronic medical problems which includes hypertension and hyperlipidemia. He is taking medication regularly.  The patient returns today for his regular 89-monthcheckup.  He has had pneumonia about 4 weeks ago and he will get a repeat chest x-ray today.  He is feeling better.  His blood pressure today initially was high but on repeat was 123/86.  He is due to get a colonoscopy as this was to have been done 5 years after the previous one.  There is a family history of colon cancer and therefore this patient is past due on his colonoscopy which should be done this year.  The patient has had a history of elevated blood pressure in the past but with weight loss and watching sodium intake was able to get off the medicine.  He does complain of some left-sided headaches at times.  He does not correlate this with his blood pressure.  He is feeling better as far as the cough and congestion is concerned.  He got his repeat chest x-ray today as a follow-up on the pneumonia.  His previous gastroenterologist has retired he will need another colonoscopy now because of his father having had colon cancer.  We will try to arrange this for him.  Today he denies any chest pain or ongoing shortness of breath.  He denies any trouble with nausea vomiting diarrhea blood in the stool or black tarry bowel movements.  He is passing his water without problems.  He has not had any change in bowel habits.    Patient Active Problem List   Diagnosis Date Noted  . History of colon cancer 12/19/2016  . Benign prostatic hyperplasia without lower urinary tract symptoms 12/19/2016  . Vitamin D deficiency 12/19/2016  . Family history of bladder cancer 12/19/2016  . Diverticulosis of colon without hemorrhage 01/03/2013  . Erectile dysfunction 01/03/2013  . Allergic rhinitis 01/03/2013  .  Hypertension 06/17/2012  . Hyperlipidemia 06/17/2012  . Testosterone deficiency 06/17/2012   Outpatient Encounter Medications as of 06/18/2017  Medication Sig  . albuterol (PROVENTIL HFA;VENTOLIN HFA) 108 (90 Base) MCG/ACT inhaler Inhale 1-2 puffs into the lungs every 6 (six) hours as needed for wheezing or shortness of breath.  . fluticasone (FLONASE) 50 MCG/ACT nasal spray INSTILL 1-2 SPRAYS IN EACH NOSTRIL AT BEDTIME  . montelukast (SINGULAIR) 10 MG tablet Take 1 tablet (10 mg total) by mouth at bedtime.  . sildenafil (REVATIO) 20 MG tablet Take 2-5 tabs PRN prior to sexual activity.  . Vitamin D, Ergocalciferol, (DRISDOL) 50000 UNITS CAPS capsule Take 1 capsule (50,000 Units total) by mouth every 7 (seven) days.  . [DISCONTINUED] azithromycin (ZITHROMAX) 250 MG tablet As directed  . [DISCONTINUED] chlorpheniramine-HYDROcodone (TUSSIONEX) 10-8 MG/5ML SUER Take 5 mLs by mouth at bedtime as needed for cough.  . [DISCONTINUED] oseltamivir (TAMIFLU) 75 MG capsule Take 1 capsule (75 mg total) by mouth 2 (two) times daily.   No facility-administered encounter medications on file as of 06/18/2017.       Review of Systems  Constitutional: Negative.   HENT: Negative.   Eyes: Negative.   Respiratory: Negative.   Cardiovascular: Negative.   Gastrointestinal: Negative.   Endocrine: Negative.   Genitourinary: Negative.   Musculoskeletal: Negative.   Skin: Negative.   Allergic/Immunologic: Negative.   Neurological: Negative.   Hematological: Negative.   Psychiatric/Behavioral:  Negative.        Objective:   Physical Exam  Constitutional: He is oriented to person, place, and time. He appears well-developed and well-nourished. No distress.  Patient is pleasant and alert and feeling better than he was several weeks ago.  HENT:  Head: Normocephalic and atraumatic.  Right Ear: External ear normal.  Left Ear: External ear normal.  Nose: Nose normal.  Mouth/Throat: Oropharynx is clear and  moist. No oropharyngeal exudate.  Eyes: Pupils are equal, round, and reactive to light. Conjunctivae and EOM are normal. Right eye exhibits no discharge. Left eye exhibits no discharge. No scleral icterus.  Neck: Normal range of motion. Neck supple. No thyromegaly present.  No bruits thyromegaly or anterior cervical adenopathy  Cardiovascular: Normal rate, regular rhythm, normal heart sounds and intact distal pulses.  No murmur heard. Heart is regular at 72/min  Pulmonary/Chest: Effort normal and breath sounds normal. He has no wheezes. He has no rales. He exhibits no tenderness.  Clear anteriorly and posteriorly without any rales or rhonchi.  No axillary adenopathy or chest wall masses.  Abdominal: Soft. Bowel sounds are normal. He exhibits no mass. There is no tenderness. There is no guarding.  No abdominal tenderness masses organ enlargement or bruits  Musculoskeletal: Normal range of motion. He exhibits no edema or tenderness.  Lymphadenopathy:    He has no cervical adenopathy.  Neurological: He is alert and oriented to person, place, and time. He has normal reflexes. No cranial nerve deficit.  Skin: Skin is warm and dry. No rash noted.  Psychiatric: He has a normal mood and affect. His behavior is normal. Judgment and thought content normal.  All within normal limits  Nursing note and vitals reviewed.   BP (!) 140/93 (BP Location: Left Arm)   Pulse 82   Temp 97.8 F (36.6 C) (Oral)   Ht 5' 9"  (1.753 m)   Wt 185 lb (83.9 kg)   BMI 27.32 kg/m        Assessment & Plan:  1. Essential hypertension -Blood pressure was elevated on 2 occasions when checked manually with a large cuff in each arm.  The patient will do better with sodium restriction and try to work on his weight more.  He will bring readings by to the office in 2 to 4 weeks for review. - BMP8+EGFR - CBC with Differential/Platelet - Hepatic function panel  2. Pure hypercholesterolemia -Continue with as aggressive  therapeutic lifestyle changes as possible including diet and exercise - CBC with Differential/Platelet - Lipid panel  3. Vitamin D deficiency -Continue with vitamin D replacement pending results of lab work - CBC with Differential/Platelet - VITAMIN D 25 Hydroxy (Vit-D Deficiency, Fractures)  4. Benign prostatic hyperplasia without lower urinary tract symptoms -No complaints today with voiding - CBC with Differential/Platelet  5. Family history of bladder cancer -Check urinalysis - CBC with Differential/Platelet  6. History of recent pneumonia -Chest x-ray with results pending - DG Chest 2 View; Future - CBC with Differential/Platelet  7. Screen for colon cancer - Ambulatory referral to Gastroenterology  Patient Instructions  Continue current medications. Continue good therapeutic lifestyle changes which include good diet and exercise. Fall precautions discussed with patient. If an FOBT was given today- please return it to our front desk. If you are over 34 years old - you may need Prevnar 37 or the adult Pneumonia vaccine.  **Flu shots are available--- please call and schedule a FLU-CLINIC appointment**  After your visit with [place] today you will  receive a survey in the mail or online from [employer] regarding your care with [place]. Please take a moment to fill this out. Your feedback is very important to [place] as you can help [place] better understand your patient needs as well as improve your experience and satisfaction. WE CARE ABOUT YOU!!!   We will call you with your lab work results and chest x-ray results as soon as they become available We will schedule you for a visit with a gastroenterologist to have your repeat colonoscopy because of the positive family history of colon cancer since her current gastroenterologist has retired Watch sodium intake and lose a few pounds of weight if possible by eating more healthy Check blood pressure readings at home and bring readings by for review  in 3 to 4 weeks  Arrie Senate MD

## 2017-06-18 NOTE — Patient Instructions (Addendum)
Continue current medications. Continue good therapeutic lifestyle changes which include good diet and exercise. Fall precautions discussed with patient. If an FOBT was given today- please return it to our front desk. If you are over 64 years old - you may need Prevnar 98 or the adult Pneumonia vaccine.  **Flu shots are available--- please call and schedule a FLU-CLINIC appointment**  After your visit with [place] today you will receive a survey in the mail or online from [employer] regarding your care with [place]. Please take a moment to fill this out. Your feedback is very important to [place] as you can help [place] better understand your patient needs as well as improve your experience and satisfaction. WE CARE ABOUT YOU!!!   We will call you with your lab work results and chest x-ray results as soon as they become available We will schedule you for a visit with a gastroenterologist to have your repeat colonoscopy because of the positive family history of colon cancer since her current gastroenterologist has retired Watch sodium intake and lose a few pounds of weight if possible by eating more healthy Check blood pressure readings at home and bring readings by for review in 3 to 4 weeks

## 2017-06-19 ENCOUNTER — Other Ambulatory Visit: Payer: Self-pay | Admitting: *Deleted

## 2017-06-19 LAB — CBC WITH DIFFERENTIAL/PLATELET
Basophils Absolute: 0 10*3/uL (ref 0.0–0.2)
Basos: 1 %
EOS (ABSOLUTE): 0.1 10*3/uL (ref 0.0–0.4)
EOS: 2 %
Hematocrit: 43.1 % (ref 37.5–51.0)
Hemoglobin: 14.2 g/dL (ref 13.0–17.7)
IMMATURE GRANULOCYTES: 0 %
Immature Grans (Abs): 0 10*3/uL (ref 0.0–0.1)
LYMPHS: 37 %
Lymphocytes Absolute: 2.8 10*3/uL (ref 0.7–3.1)
MCH: 31.4 pg (ref 26.6–33.0)
MCHC: 32.9 g/dL (ref 31.5–35.7)
MCV: 95 fL (ref 79–97)
MONOS ABS: 0.6 10*3/uL (ref 0.1–0.9)
Monocytes: 8 %
NEUTROS PCT: 52 %
Neutrophils Absolute: 4 10*3/uL (ref 1.4–7.0)
PLATELETS: 335 10*3/uL (ref 150–379)
RBC: 4.52 x10E6/uL (ref 4.14–5.80)
RDW: 14.1 % (ref 12.3–15.4)
WBC: 7.6 10*3/uL (ref 3.4–10.8)

## 2017-06-19 LAB — BMP8+EGFR
BUN/Creatinine Ratio: 10 (ref 10–24)
BUN: 9 mg/dL (ref 8–27)
CALCIUM: 9.3 mg/dL (ref 8.6–10.2)
CHLORIDE: 101 mmol/L (ref 96–106)
CO2: 22 mmol/L (ref 20–29)
Creatinine, Ser: 0.9 mg/dL (ref 0.76–1.27)
GFR calc Af Amer: 105 mL/min/{1.73_m2} (ref >59)
GFR calc non Af Amer: 91 mL/min/{1.73_m2} (ref >59)
GLUCOSE: 82 mg/dL (ref 65–99)
POTASSIUM: 4.6 mmol/L (ref 3.5–5.2)
Sodium: 138 mmol/L (ref 134–144)

## 2017-06-19 LAB — LIPID PANEL
Chol/HDL Ratio: 2.3 ratio (ref 0.0–5.0)
Cholesterol, Total: 171 mg/dL (ref 100–199)
HDL: 75 mg/dL (ref >39)
LDL Calculated: 87 mg/dL (ref 0–99)
TRIGLYCERIDES: 46 mg/dL (ref 0–149)
VLDL Cholesterol Cal: 9 mg/dL (ref 5–40)

## 2017-06-19 LAB — HEPATIC FUNCTION PANEL
ALK PHOS: 104 IU/L (ref 39–117)
ALT: 17 IU/L (ref 0–44)
AST: 23 IU/L (ref 0–40)
Albumin: 4.4 g/dL (ref 3.6–4.8)
BILIRUBIN TOTAL: 0.5 mg/dL (ref 0.0–1.2)
BILIRUBIN, DIRECT: 0.15 mg/dL (ref 0.00–0.40)
Total Protein: 7.3 g/dL (ref 6.0–8.5)

## 2017-06-19 LAB — VITAMIN D 25 HYDROXY (VIT D DEFICIENCY, FRACTURES): Vit D, 25-Hydroxy: 21 ng/mL — ABNORMAL LOW (ref 30.0–100.0)

## 2017-06-19 MED ORDER — VITAMIN D (ERGOCALCIFEROL) 1.25 MG (50000 UNIT) PO CAPS: 50000.0000 [IU] | ORAL_CAPSULE | ORAL | 2 refills | Status: DC

## 2017-07-28 ENCOUNTER — Encounter: Payer: Self-pay | Admitting: Family Medicine

## 2017-11-06 ENCOUNTER — Other Ambulatory Visit: Payer: Self-pay | Admitting: *Deleted

## 2017-11-06 DIAGNOSIS — Z1211 Encounter for screening for malignant neoplasm of colon: Secondary | ICD-10-CM

## 2017-11-06 DIAGNOSIS — J029 Acute pharyngitis, unspecified: Secondary | ICD-10-CM

## 2017-11-06 DIAGNOSIS — R070 Pain in throat: Secondary | ICD-10-CM

## 2017-11-19 ENCOUNTER — Encounter: Payer: Self-pay | Admitting: Internal Medicine

## 2017-12-22 ENCOUNTER — Ambulatory Visit (INDEPENDENT_AMBULATORY_CARE_PROVIDER_SITE_OTHER): Payer: BLUE CROSS/BLUE SHIELD | Admitting: Family Medicine

## 2017-12-22 ENCOUNTER — Encounter: Payer: Self-pay | Admitting: Family Medicine

## 2017-12-22 VITALS — BP 136/87 | HR 79 | Temp 98.8°F | Ht 69.0 in | Wt 189.0 lb

## 2017-12-22 DIAGNOSIS — E559 Vitamin D deficiency, unspecified: Secondary | ICD-10-CM

## 2017-12-22 DIAGNOSIS — E7800 Pure hypercholesterolemia, unspecified: Secondary | ICD-10-CM

## 2017-12-22 DIAGNOSIS — I10 Essential (primary) hypertension: Secondary | ICD-10-CM

## 2017-12-22 DIAGNOSIS — N400 Enlarged prostate without lower urinary tract symptoms: Secondary | ICD-10-CM

## 2017-12-22 DIAGNOSIS — Z0000 Encounter for general adult medical examination without abnormal findings: Secondary | ICD-10-CM

## 2017-12-22 DIAGNOSIS — Z8052 Family history of malignant neoplasm of bladder: Secondary | ICD-10-CM

## 2017-12-22 DIAGNOSIS — Z23 Encounter for immunization: Secondary | ICD-10-CM | POA: Diagnosis not present

## 2017-12-22 NOTE — Progress Notes (Signed)
Subjective:    Patient ID: Carl Mccullough, male    DOB: 12-12-1955, 62 y.o.   MRN: [id number]  HPI Patient is here today for annual wellness exam and follow up of chronic medical problems which includes hypertension and hyperlipidemia. He Is taking medication regularly.  The patient is doing well overall and is here for a complete physical.  He is due to get a colonoscopy soon.  Initial blood pressure was elevated but the repeat was better.  He does complain today of a left lower leg rash.  He will get a urinalysis and EKG today and will get his flu shot.  Both of his parents are deceased.  His mother had Alzheimer's dementia and chronic renal failure along with hypertension.  His father had colon cancer.  He has a brother that also has hypertension.  Patient currently is using an albuterol inhaler Flonase and taking Singulair.  He also uses generic Viagra.  He is on 50,000 units of vitamin D weekly.  It is also important note there is a history of bladder cancer in his father as well as colon cancer.  She is pleasant and doing well.  He has had a lower leg rash and also a rash on his cheek and neck and testicle area but he has been working outside and this seems to be getting better so it may been a contact sensitivity to possibly poison ivy.  He does check his blood pressures at home and his weight is up some his blood pressures have been running sometimes greater than 140.  He will try to do better with weight loss and sodium restriction.  Today he denies any chest pain or shortness of breath.  He denies any trouble with swallowing heartburn or indigestion or change in bowel habits.  He does still take omeprazole.  And ear nose and throat specialist felt that the discomfort he was having in his neck was related to possibly reflux.  He does not notice any trouble swallowing.  He is passing his water without problems.  He had an eye exam back in the spring of this year and everything was  stable.    Patient Active Problem List   Diagnosis Date Noted  . History of colon cancer 12/19/2016  . Benign prostatic hyperplasia without lower urinary tract symptoms 12/19/2016  . Vitamin D deficiency 12/19/2016  . Family history of bladder cancer 12/19/2016  . Diverticulosis of colon without hemorrhage 01/03/2013  . Erectile dysfunction 01/03/2013  . Allergic rhinitis 01/03/2013  . Hypertension 06/17/2012  . Hyperlipidemia 06/17/2012  . Testosterone deficiency 06/17/2012   Outpatient Encounter Medications as of 12/22/2017  Medication Sig  . albuterol (PROVENTIL HFA;VENTOLIN HFA) 108 (90 Base) MCG/ACT inhaler Inhale 1-2 puffs into the lungs every 6 (six) hours as needed for wheezing or shortness of breath.  . fluticasone (FLONASE) 50 MCG/ACT nasal spray INSTILL 1-2 SPRAYS IN EACH NOSTRIL AT BEDTIME  . montelukast (SINGULAIR) 10 MG tablet Take 1 tablet (10 mg total) by mouth at bedtime.  . sildenafil (REVATIO) 20 MG tablet Take 2-5 tabs PRN prior to sexual activity.  . Vitamin D, Ergocalciferol, (DRISDOL) 50000 units CAPS capsule Take 1 capsule (50,000 Units total) by mouth every 7 (seven) days.   No facility-administered encounter medications on file as of 12/22/2017.      Review of Systems  Constitutional: Negative.   HENT: Negative.   Eyes: Negative.   Respiratory: Negative.   Cardiovascular: Negative.   Gastrointestinal: Negative.   Endocrine:  Negative.   Genitourinary: Negative.   Musculoskeletal: Negative.   Skin: Positive for rash (left lower leg rash).  Allergic/Immunologic: Negative.   Neurological: Negative.   Hematological: Negative.   Psychiatric/Behavioral: Negative.        Objective:   Physical Exam  Constitutional: He is oriented to person, place, and time. He appears well-developed and well-nourished. No distress.  Is pleasant and doing well and has an upcoming appointment soon with gastroenterology for routine colonoscopy follow-up but also because of  concerns about whether he is having reflux or not with ongoing throat and neck issues.  HENT:  Head: Normocephalic and atraumatic.  Right Ear: External ear normal.  Left Ear: External ear normal.  Mouth/Throat: Oropharynx is clear and moist. No oropharyngeal exudate.  Slight nasal congestion TMs are clear and throat appears normal  Eyes: Pupils are equal, round, and reactive to light. Conjunctivae and EOM are normal. Right eye exhibits no discharge. Left eye exhibits no discharge. No scleral icterus.  Up-to-date on eye exam  Neck: Normal range of motion. Neck supple. No thyromegaly present.  No bruits thyromegaly or anterior cervical adenopathy  Cardiovascular: Normal rate, regular rhythm, normal heart sounds and intact distal pulses.  No murmur heard. Heart is regular at 72/min with good pedal pulses and no edema  Pulmonary/Chest: Effort normal and breath sounds normal. He has no wheezes. He has no rales. He exhibits no tenderness.  Clear anteriorly and posteriorly and no axillary adenopathy no chest wall masses  Abdominal: Soft. Bowel sounds are normal. He exhibits no mass. There is no tenderness.  Generalized abdominal tenderness without organ enlargement or specific sites of tenderness either in the epigastrium or suprapubic area.  No liver or spleen enlargement no masses and no inguinal adenopathy  Genitourinary: Rectum normal and penis normal.  Genitourinary Comments: Prostate is minimally enlarged if any either 1 or 2+ out of 10.  There were no rectal masses.  External genitalia were within normal limits and there were no inguinal hernias palpable.  Musculoskeletal: Normal range of motion. He exhibits no edema or tenderness.  Lymphadenopathy:    He has no cervical adenopathy.  Neurological: He is alert and oriented to person, place, and time. He has normal reflexes. No cranial nerve deficit.  Reflexes are 2+ and equal bilaterally and plantars are down.  Skin: Skin is warm and dry. No  rash noted.  No rash observed today.  What he is complaining with is probably just an atopic dermatitis from a contact dermatitis.  Psychiatric: He has a normal mood and affect. His behavior is normal. Judgment and thought content normal.  The patient's mood affect and behavior are all normal.  Nursing note and vitals reviewed.  BP (!) 145/88 (BP Location: Left Arm)   Pulse 79   Temp 98.8 F (37.1 C) (Oral)   Ht 5' 9"  (1.753 m)   Wt 189 lb (85.7 kg)   BMI 27.91 kg/m        Assessment & Plan:  1. Annual physical exam -Get colonoscopy and possibly endoscopy as discussed - BMP8+EGFR - CBC with Differential/Platelet - Lipid panel - PSA, total and free - VITAMIN D 25 Hydroxy (Vit-D Deficiency, Fractures) - Hepatic function panel - Urinalysis, Complete - EKG 12-Lead  2. Essential hypertension -The blood pressure is borderline and the patient should make every effort to lose some weight and get the blood pressure consistently down less than 140 and closer to 80 consistently. - BMP8+EGFR - CBC with Differential/Platelet - EKG 12-Lead  3. Pure hypercholesterolemia -Continue with aggressive therapeutic lifestyle changes including diet and exercise - CBC with Differential/Platelet - Lipid panel - EKG 12-Lead  4. Vitamin D deficiency - CBC with Differential/Platelet - VITAMIN D 25 Hydroxy (Vit-D Deficiency, Fractures)  5. Benign prostatic hyperplasia without lower urinary tract symptoms -Minimal prostate enlargement with no prostate symptoms. - CBC with Differential/Platelet - PSA, total and free - Urinalysis, Complete  6. Family history of bladder cancer -Father had bladder cancer and colon cancer - CBC with Differential/Platelet - Urinalysis, Complete  7. Need for immunization against influenza - Flu Vaccine QUAD 36+ mos IM  No orders of the defined types were placed in this encounter.  Patient Instructions  Continue current medications. Continue good therapeutic  lifestyle changes which include good diet and exercise. Fall precautions discussed with patient. If an FOBT was given today- please return it to our front desk. If you are over 78 years old - you may need Prevnar 37 or the adult Pneumonia vaccine.  **Flu shots are available--- please call and schedule a FLU-CLINIC appointment**  After your visit with [place] today you will receive a survey in the mail or online from [employer] regarding your care with [place]. Please take a moment to fill this out. Your feedback is very important to [place] as you can help [place] better understand your patient needs as well as improve your experience and satisfaction. WE CARE ABOUT YOU!!!   Keep follow-up appointment with gastroenterologist and remind him of the issues you are having with your throat and the unresolved issue is what is causing that. You are definitely needing to get your colonoscopy and he may need to do an endoscopy at the same time. Continue to drink plenty of fluids and stay well-hydrated Continue to get eye exams regularly and make sure that this office gets a copy of that report We will give you your flu shot today. Make every effort to lose weight through diet and exercise Continue to watch sodium intake Check blood pressures more regularly at home  Arrie Senate MD

## 2017-12-22 NOTE — Patient Instructions (Addendum)
Continue current medications. Continue good therapeutic lifestyle changes which include good diet and exercise. Fall precautions discussed with patient. If an FOBT was given today- please return it to our front desk. If you are over 64 years old - you may need Prevnar 78 or the adult Pneumonia vaccine.  **Flu shots are available--- please call and schedule a FLU-CLINIC appointment**  After your visit with [place] today you will receive a survey in the mail or online from [employer] regarding your care with [place]. Please take a moment to fill this out. Your feedback is very important to [place] as you can help [place] better understand your patient needs as well as improve your experience and satisfaction. WE CARE ABOUT YOU!!!   Keep follow-up appointment with gastroenterologist and remind him of the issues you are having with your throat and the unresolved issue is what is causing that. You are definitely needing to get your colonoscopy and he may need to do an endoscopy at the same time. Continue to drink plenty of fluids and stay well-hydrated Continue to get eye exams regularly and make sure that this office gets a copy of that report We will give you your flu shot today. Make every effort to lose weight through diet and exercise Continue to watch sodium intake Check blood pressures more regularly at home

## 2017-12-23 LAB — BMP8+EGFR
BUN/Creatinine Ratio: 11 (ref 10–24)
BUN: 12 mg/dL (ref 8–27)
CALCIUM: 9.3 mg/dL (ref 8.6–10.2)
CO2: 22 mmol/L (ref 20–29)
Chloride: 98 mmol/L (ref 96–106)
Creatinine, Ser: 1.1 mg/dL (ref 0.76–1.27)
GFR calc non Af Amer: 71 mL/min/{1.73_m2} (ref >59)
GFR, EST AFRICAN AMERICAN: 82 mL/min/{1.73_m2} (ref >59)
Glucose: 87 mg/dL (ref 65–99)
POTASSIUM: 4 mmol/L (ref 3.5–5.2)
Sodium: 135 mmol/L (ref 134–144)

## 2017-12-23 LAB — HEPATIC FUNCTION PANEL
ALT: 20 IU/L (ref 0–44)
AST: 19 IU/L (ref 0–40)
Albumin: 4.6 g/dL (ref 3.6–4.8)
Alkaline Phosphatase: 86 IU/L (ref 39–117)
BILIRUBIN, DIRECT: 0.12 mg/dL (ref 0.00–0.40)
Bilirubin Total: 0.3 mg/dL (ref 0.0–1.2)
Total Protein: 7.3 g/dL (ref 6.0–8.5)

## 2017-12-23 LAB — MICROSCOPIC EXAMINATION
BACTERIA UA: NONE SEEN
Casts: NONE SEEN /lpf
Epithelial Cells (non renal): NONE SEEN /hpf (ref 0–10)
RBC, UA: NONE SEEN /hpf (ref 0–2)

## 2017-12-23 LAB — CBC WITH DIFFERENTIAL/PLATELET
BASOS: 1 %
Basophils Absolute: 0.1 10*3/uL (ref 0.0–0.2)
EOS (ABSOLUTE): 0.1 10*3/uL (ref 0.0–0.4)
Eos: 1 %
HEMATOCRIT: 40.4 % (ref 37.5–51.0)
Hemoglobin: 14.8 g/dL (ref 13.0–17.7)
IMMATURE GRANS (ABS): 0 10*3/uL (ref 0.0–0.1)
Immature Granulocytes: 0 %
LYMPHS: 33 %
Lymphocytes Absolute: 2.4 10*3/uL (ref 0.7–3.1)
MCH: 33.3 pg — ABNORMAL HIGH (ref 26.6–33.0)
MCHC: 36.6 g/dL — AB (ref 31.5–35.7)
MCV: 91 fL (ref 79–97)
MONOCYTES: 8 %
Monocytes Absolute: 0.6 10*3/uL (ref 0.1–0.9)
NEUTROS ABS: 4.1 10*3/uL (ref 1.4–7.0)
Neutrophils: 57 %
PLATELETS: 254 10*3/uL (ref 150–450)
RBC: 4.45 x10E6/uL (ref 4.14–5.80)
RDW: 12.3 % (ref 12.3–15.4)
WBC: 7.2 10*3/uL (ref 3.4–10.8)

## 2017-12-23 LAB — PSA, TOTAL AND FREE
PSA FREE PCT: 45 %
PSA FREE: 0.36 ng/mL
Prostate Specific Ag, Serum: 0.8 ng/mL (ref 0.0–4.0)

## 2017-12-23 LAB — URINALYSIS, COMPLETE
BILIRUBIN UA: NEGATIVE
GLUCOSE, UA: NEGATIVE
Ketones, UA: NEGATIVE
Leukocytes, UA: NEGATIVE
NITRITE UA: NEGATIVE
PROTEIN UA: NEGATIVE
RBC, UA: NEGATIVE
SPEC GRAV UA: 1.006 (ref 1.005–1.030)
Urobilinogen, Ur: 0.2 mg/dL (ref 0.2–1.0)
pH, UA: 5.5 (ref 5.0–7.5)

## 2017-12-23 LAB — LIPID PANEL
CHOLESTEROL TOTAL: 181 mg/dL (ref 100–199)
Chol/HDL Ratio: 2.6 ratio (ref 0.0–5.0)
HDL: 70 mg/dL (ref >39)
LDL Calculated: 99 mg/dL (ref 0–99)
TRIGLYCERIDES: 58 mg/dL (ref 0–149)
VLDL Cholesterol Cal: 12 mg/dL (ref 5–40)

## 2017-12-23 LAB — VITAMIN D 25 HYDROXY (VIT D DEFICIENCY, FRACTURES): VIT D 25 HYDROXY: 47.2 ng/mL (ref 30.0–100.0)

## 2017-12-24 ENCOUNTER — Encounter: Payer: Self-pay | Admitting: *Deleted

## 2017-12-28 ENCOUNTER — Telehealth: Payer: Self-pay | Admitting: Family Medicine

## 2017-12-28 NOTE — Telephone Encounter (Signed)
Pt has apt Wednesday at gastro dr Tessie Fass needs to talk to Roselyn Reef about some ppw that they will need for Wednesday

## 2017-12-28 NOTE — Telephone Encounter (Signed)
Spoke with wife, she will sign a ROI at [facility] and they will get [employer] by fax

## 2017-12-30 ENCOUNTER — Ambulatory Visit: Payer: BLUE CROSS/BLUE SHIELD | Admitting: Internal Medicine

## 2017-12-30 ENCOUNTER — Encounter: Payer: Self-pay | Admitting: Internal Medicine

## 2017-12-30 VITALS — BP 124/90 | HR 80 | Ht 68.0 in | Wt 187.1 lb

## 2017-12-30 DIAGNOSIS — K219 Gastro-esophageal reflux disease without esophagitis: Secondary | ICD-10-CM

## 2017-12-30 DIAGNOSIS — Z86010 Personal history of colonic polyps: Secondary | ICD-10-CM

## 2017-12-30 DIAGNOSIS — Z800 Family history of malignant neoplasm of digestive organs: Secondary | ICD-10-CM | POA: Diagnosis not present

## 2017-12-30 DIAGNOSIS — K5732 Diverticulitis of large intestine without perforation or abscess without bleeding: Secondary | ICD-10-CM | POA: Diagnosis not present

## 2017-12-30 MED ORDER — SUPREP BOWEL PREP KIT 17.5-3.13-1.6 GM/177ML PO SOLN: 1.0000 | ORAL | 0 refills | Status: DC

## 2017-12-30 MED ORDER — CIPROFLOXACIN HCL 500 MG PO TABS: 500.0000 mg | ORAL_TABLET | Freq: Three times a day (TID) | ORAL | 0 refills | Status: AC

## 2017-12-30 MED ORDER — METRONIDAZOLE 500 MG PO TABS: 500.0000 mg | ORAL_TABLET | Freq: Two times a day (BID) | ORAL | 0 refills | Status: DC

## 2017-12-30 NOTE — Progress Notes (Signed)
Patient ID: Carl Mccullough, male   DOB: 1955/07/25, 62 y.o.   MRN: [id number] HPI: Carl Mccullough is a 62 year old male with a past medical history of colon polyps, diverticulosis with history of diverticulitis, possible GERD, hypertension, hyperlipidemia, BPH seen in consultation at the request of Carl Mccullough for surveillance colonoscopy, history of diverticulitis and possible GERD.  He is here with his wife.  He has a GI history with Dr. Teena Mccullough and reports having a colonoscopy 6 years ago.  He feels he has had 2 prior colonoscopies.  He recalls polyps being removed at his last colonoscopy and recalls a 5-year recall interval being recommended.  His father also had colon cancer.  Currently he is expressing left lower quadrant abdominal pain starting about 24 hours ago.  Very reminiscent of prior diverticulitis episodes.  No change in bowel habits though overall bowels at times can be loose.  No significant diarrhea or constipation complaint.  No rectal bleeding or melena.  He estimates about 2 episodes of diverticulitis per year on average.    He reports a tightness in his neck which he notices when he turns his neck from side to side.  He saw Dr. Wilburn Mccullough, with ENT, in December 2018.  He reports that he had a direct laryngoscopy for sore throat which showed erythema and inflammation felt secondary to reflux.  He was started on omeprazole 40 mg which he took before dinner for 2 or 3 months.  Initially he felt like this may have helped symptoms but he stopped the medication.  Symptoms recurred and he started it back to her 3 months ago and has not felt much improvement.  He also took ranitidine for some time opposite the omeprazole and also felt no improvement.  He has never had traditional heartburn.  No dysphagia or odynophagia.  No nausea or vomiting.  Good appetite.  He does notice some sinus drainage, hoarseness and throat clearing.  No water brash, sour brash or pyrosis.  No prior upper  endoscopy  Past Medical History:  Diagnosis Date  . Benign prostatic hyperplasia   . Diverticulitis   . GERD (gastroesophageal reflux disease)   . Hyperlipidemia   . Hypertension   . Vitamin D deficiency     Past Surgical History:  Procedure Laterality Date  . CYSTOSCOPY  [id number]  . TONSILLECTOMY      Outpatient Medications Prior to Visit  Medication Sig Dispense Refill  . albuterol (PROVENTIL HFA;VENTOLIN HFA) 108 (90 Base) MCG/ACT inhaler Inhale 1-2 puffs into the lungs every 6 (six) hours as needed for wheezing or shortness of breath. 1 Inhaler 1  . fluticasone (FLONASE) 50 MCG/ACT nasal spray INSTILL 1-2 SPRAYS IN EACH NOSTRIL AT BEDTIME (Patient taking differently: as needed. ) 16 g 5  . montelukast (SINGULAIR) 10 MG tablet Take 1 tablet (10 mg total) by mouth at bedtime. 30 tablet 3  . omeprazole (PRILOSEC) 40 MG capsule Take 40 mg by mouth every evening.    . sildenafil (REVATIO) 20 MG tablet Take 2-5 tabs PRN prior to sexual activity. 50 tablet 11  . Vitamin D, Ergocalciferol, (DRISDOL) 50000 units CAPS capsule Take 1 capsule (50,000 Units total) by mouth every 7 (seven) days. 12 capsule 2   No facility-administered medications prior to visit.     No Known Allergies  Family History  Problem Relation Age of Onset  . Hypertension Mother   . Alzheimer's disease Mother   . Chronic Renal Failure Mother   . Colon cancer Father   .  Bladder Cancer Father   . Hypertension Brother     Social History   Tobacco Use  . Smoking status: Never Smoker  . Smokeless tobacco: Never Used  Substance Use Topics  . Alcohol use: Yes  . Drug use: No    ROS: As per history of present illness, otherwise negative  BP 124/90   Pulse 80   Ht 5\' 8"  (1.727 m)   Wt 187 lb 2 oz (84.9 kg)   BMI 28.45 kg/m  Constitutional: Well-developed and well-nourished. No distress. HEENT: Normocephalic and atraumatic.  Conjunctivae are normal.  No scleral icterus. Neck: Neck supple. Trachea  midline. Cardiovascular: Normal rate, regular rhythm and intact distal pulses. No M/R/G Pulmonary/chest: Effort normal and breath sounds normal. No wheezing, rales or rhonchi. Abdominal: Soft, left lower and suprapubic abdominal tenderness which is moderate with mild rebound, no guarding, nondistended. Bowel sounds active throughout. There are no masses palpable. No hepatosplenomegaly. Extremities: no clubbing, cyanosis, or edema Neurological: Alert and oriented to person place and time. Skin: Skin is warm and dry. Psychiatric: Normal mood and affect. Behavior is normal.   RELEVANT LABS AND IMAGING: CBC    Component Value Date/Time   WBC 7.2 12/22/2017 1719   WBC 8.3 06/17/2014 0918   RBC 4.45 12/22/2017 1719   RBC 4.99 06/17/2014 0918   HGB 14.8 12/22/2017 1719   HCT 40.4 12/22/2017 1719   PLT 254 12/22/2017 1719   MCV 91 12/22/2017 1719   MCH 33.3 (H) 12/22/2017 1719   MCH 30.1 06/17/2014 0918   MCHC 36.6 (H) 12/22/2017 1719   MCHC 31.3 (A) 06/17/2014 0918   RDW 12.3 12/22/2017 1719   LYMPHSABS 2.4 12/22/2017 1719   EOSABS 0.1 12/22/2017 1719   BASOSABS 0.1 12/22/2017 1719    CMP     Component Value Date/Time   NA 135 12/22/2017 1719   K 4.0 12/22/2017 1719   CL 98 12/22/2017 1719   CO2 22 12/22/2017 1719   GLUCOSE 87 12/22/2017 1719   GLUCOSE 90 01/03/2013 0832   BUN 12 12/22/2017 1719   CREATININE 1.10 12/22/2017 1719   CREATININE 0.92 01/03/2013 0832   CALCIUM 9.3 12/22/2017 1719   PROT 7.3 12/22/2017 1719   ALBUMIN 4.6 12/22/2017 1719   AST 19 12/22/2017 1719   ALT 20 12/22/2017 1719   ALKPHOS 86 12/22/2017 1719   BILITOT 0.3 12/22/2017 1719   GFRNONAA 71 12/22/2017 1719   GFRNONAA >89 01/03/2013 0832   GFRAA 82 12/22/2017 1719   GFRAA >89 01/03/2013 0832    ASSESSMENT/PLAN: 62 year old male with a past medical history of colon polyps, diverticulosis with history of diverticulitis, possible GERD, hypertension, hyperlipidemia, BPH seen in consultation at  the request of Carl Mccullough for surveillance colonoscopy, history of diverticulitis and possible GERD.  1.  Diverticulitis --symptoms consistent with acute diverticulitis.  Begin low residue diet.  Cipro 500 mg twice daily and metronidazole 500 mg 3 times daily x7 days.  He is asked to notify me if symptoms are not improving within the first couple of days or certainly if worsening.  2.  History of colon polyps/family history of colon cancer --surveillance colonoscopy indicated at this time however would like to wait 4 weeks until resolution of diverticulitis.  We discussed the risk, benefits and alternatives to colonoscopy and he is agreeable wishing to proceed.  We will proceed in about a month's time assuming diverticulitis episode resolves in an uncomplicated fashion.  3.  Question GERD/LPR/neck discomfort --reduce evaluation by ENT.  I  am not convinced that he has reflux disease and he certainly does not have traditional heartburn.  His laryngeal symptoms could be that of reflux but I would have expected improvement with omeprazole and especially omeprazole plus ranitidine.  Could be postnasal drip.  Given no improvement recently we will have him stop omeprazole.  Start Zyrtec 10 mg daily.  He should try this for 1 month's time and if not improving will proceed to upper endoscopy at the time of his colonoscopy.  We discussed the risk, benefits and alternatives to upper and lower endoscopy and he is agreeable and wishes to proceed.   DA:PTCKF, Estella Husk, [address] [place], [place] [id number]

## 2017-12-30 NOTE — Patient Instructions (Signed)
You have been scheduled for an endoscopy and colonoscopy. Please follow the written instructions given to you at your visit today. Please pick up your prep supplies at the pharmacy within the next 1-3 days. If you use inhalers (even only as needed), please bring them with you on the day of your procedure. Your physician has requested that you go to www.startemmi.com and enter the access code given to you at your visit today. This web site gives a general overview about your procedure. However, you should still follow specific instructions given to you by our office regarding your preparation for the procedure.  Discontinue omeprazole.  Please purchase the following medications over the counter and take as directed: Zyrtec 1 tablet daily.  We have sent the following medications to your pharmacy for you to pick up at your convenience: cipro 500 mg twice daily x 7 days Flagyl 500 mg three times daily x 7 days  If you are age 56 or older, your body mass index should be between 23-30. Your Body mass index is 28.45 kg/m. If this is out of the aforementioned range listed, please consider follow up with your Primary Care Provider.  If you are age 60 or younger, your body mass index should be between 19-25. Your Body mass index is 28.45 kg/m. If this is out of the aformentioned range listed, please consider follow up with your Primary Care Provider.

## 2018-01-01 ENCOUNTER — Other Ambulatory Visit: Payer: Self-pay | Admitting: *Deleted

## 2018-01-01 MED ORDER — CETIRIZINE HCL 10 MG PO TABS: 10.0000 mg | ORAL_TABLET | Freq: Every day | ORAL | 11 refills | Status: DC

## 2018-01-04 ENCOUNTER — Other Ambulatory Visit: Payer: Self-pay | Admitting: Family Medicine

## 2018-01-04 ENCOUNTER — Other Ambulatory Visit: Payer: Self-pay | Admitting: *Deleted

## 2018-01-04 ENCOUNTER — Ambulatory Visit (INDEPENDENT_AMBULATORY_CARE_PROVIDER_SITE_OTHER): Payer: BLUE CROSS/BLUE SHIELD

## 2018-01-04 DIAGNOSIS — M545 Low back pain, unspecified: Secondary | ICD-10-CM

## 2018-01-28 ENCOUNTER — Ambulatory Visit (AMBULATORY_SURGERY_CENTER): Payer: BLUE CROSS/BLUE SHIELD | Admitting: Internal Medicine

## 2018-01-28 ENCOUNTER — Encounter: Payer: Self-pay | Admitting: Internal Medicine

## 2018-01-28 VITALS — BP 123/87 | HR 58 | Temp 98.2°F | Resp 18 | Ht 68.0 in | Wt 187.0 lb

## 2018-01-28 DIAGNOSIS — R070 Pain in throat: Secondary | ICD-10-CM | POA: Diagnosis present

## 2018-01-28 DIAGNOSIS — D120 Benign neoplasm of cecum: Secondary | ICD-10-CM | POA: Diagnosis not present

## 2018-01-28 DIAGNOSIS — D125 Benign neoplasm of sigmoid colon: Secondary | ICD-10-CM | POA: Diagnosis not present

## 2018-01-28 DIAGNOSIS — Z860100 Personal history of colon polyps, unspecified: Secondary | ICD-10-CM

## 2018-01-28 DIAGNOSIS — D122 Benign neoplasm of ascending colon: Secondary | ICD-10-CM

## 2018-01-28 DIAGNOSIS — K219 Gastro-esophageal reflux disease without esophagitis: Secondary | ICD-10-CM

## 2018-01-28 MED ORDER — SODIUM CHLORIDE 0.9 % IV SOLN: 500.0000 mL | Freq: Once | INTRAVENOUS | Status: DC

## 2018-01-28 NOTE — Progress Notes (Signed)
PT taken to PACU. Monitors in place. VSS. Report given to RN. 

## 2018-01-28 NOTE — Op Note (Signed)
[facility] Patient Name: Carl Mccullough Procedure Date: 01/28/2018 3:16 PM MRN: [id number] Endoscopist: Jerene Bears , MD Age: 62 Referring MD:  Date of Birth: Aug 23, 1955 Gender: Male Account #: [id number] Procedure:                Colonoscopy Indications:              High risk colon cancer surveillance: Personal                            history of colonic polyps, Last colonoscopy: 2013 Medicines:                Monitored Anesthesia Care Procedure:                Pre-Anesthesia Assessment:                           - Prior to the procedure, a History and Physical                            was performed, and patient medications and                            allergies were reviewed. The patient's tolerance of                            previous anesthesia was also reviewed. The risks                            and benefits of the procedure and the sedation                            options and risks were discussed with the patient.                            All questions were answered, and informed consent                            was obtained. Prior Anticoagulants: The patient has                            taken no previous anticoagulant or antiplatelet                            agents. ASA Grade Assessment: II - A patient with                            mild systemic disease. After reviewing the risks                            and benefits, the patient was deemed in                            satisfactory condition to undergo the procedure.  After obtaining informed consent, the colonoscope                            was passed under direct vision. Throughout the                            procedure, the patient's blood pressure, pulse, and                            oxygen saturations were monitored continuously. The                            Colonoscope was introduced through the anus and                            advanced to the  cecum, identified by appendiceal                            orifice and ileocecal valve. The colonoscopy was                            performed without difficulty. The patient tolerated                            the procedure well. The quality of the bowel                            preparation was good. The ileocecal valve,                            appendiceal orifice, and rectum were photographed. Scope In: 3:35:21 PM Scope Out: 2:35:57 PM Scope Withdrawal Time: 0 hours 28 minutes 40 seconds  Total Procedure Duration: 0 hours 30 minutes 56 seconds  Findings:                 The digital rectal exam was normal.                           Four sessile polyps were found in the cecum. The                            polyps were 4 to 6 mm in size. These polyps were                            removed with a cold snare. Resection and retrieval                            were complete.                           A 10 mm polyp was found in the ascending colon. The                            polyp was sessile. The  polyp was removed with a hot                            snare. Resection and retrieval were complete.                           Five sessile polyps were found in the ascending                            colon. The polyps were 4 to 7 mm in size. These                            polyps were removed with a cold snare. Resection                            and retrieval were complete.                           Two sessile polyps were found in the sigmoid colon.                            The polyps were 3 to 5 mm in size. These polyps                            were removed with a cold snare. Resection and                            retrieval were complete.                           A 8 mm polyp was found in the sigmoid colon. The                            polyp was sessile. The polyp was removed with a hot                            snare. Resection and retrieval were complete.                            Multiple small and large-mouthed diverticula were                            found in the sigmoid colon, descending colon and                            ascending colon.                           The retroflexed view of the distal rectum and anal                            verge was normal and showed no anal or rectal  abnormalities. Complications:            No immediate complications. Estimated Blood Loss:     Estimated blood loss was minimal. Impression:               - Four 4 to 6 mm polyps in the cecum, removed with                            a cold snare. Resected and retrieved.                           - One 10 mm polyp in the ascending colon, removed                            with a hot snare. Resected and retrieved.                           - Five 4 to 7 mm polyps in the ascending colon,                            removed with a cold snare. Resected and retrieved.                           - Two 3 to 5 mm polyps in the sigmoid colon,                            removed with a cold snare. Resected and retrieved.                           - One 8 mm polyp in the sigmoid colon, removed with                            a hot snare. Resected and retrieved.                           - Diverticulosis in the sigmoid colon, in the                            descending colon and in the ascending colon. Recommendation:           - Patient has a contact number available for                            emergencies. The signs and symptoms of potential                            delayed complications were discussed with the                            patient. Return to normal activities tomorrow.                            Written discharge instructions were provided to the  patient.                           - Resume previous diet.                           - Continue present medications.                           - Await  pathology results.                           - Repeat colonoscopy is recommended for                            surveillance of multiple polyps. The colonoscopy                            date will be determined after pathology results                            from today's exam become available for review.                           - No ibuprofen, naproxen, or other non-steroidal                            anti-inflammatory drugs for 2 weeks after polyp                            removal. Jerene Bears, MD 01/28/2018 4:15:35 PM This report has been signed electronically.

## 2018-01-28 NOTE — Progress Notes (Signed)
Called to room to assist during endoscopic procedure.  Patient ID and intended procedure confirmed with present staff. Received instructions for my participation in the procedure from the performing physician.  

## 2018-01-28 NOTE — Patient Instructions (Signed)
NO NSAIDS( IBUPROFEN,MOTRIN,ADVIL, ALEVE, NAPROSYN ETC) X 2 WEEKS (December 26,2019)  AWAIT PATHOLOGY REPORTS.     YOU HAD AN ENDOSCOPIC PROCEDURE TODAY AT [facility]:   Refer to the procedure report that was given to you for any specific questions about what was found during the examination.  If the procedure report does not answer your questions, please call your gastroenterologist to clarify.  If you requested that your care partner not be given the details of your procedure findings, then the procedure report has been included in a sealed envelope for you to review at your convenience later.  YOU SHOULD EXPECT: Some feelings of bloating in the abdomen. Passage of more gas than usual.  Walking can help get rid of the air that was put into your GI tract during the procedure and reduce the bloating. If you had a lower endoscopy (such as a colonoscopy or flexible sigmoidoscopy) you may notice spotting of blood in your stool or on the toilet paper. If you underwent a bowel prep for your procedure, you may not have a normal bowel movement for a few days.  Please Note:  You might notice some irritation and congestion in your nose or some drainage.  This is from the oxygen used during your procedure.  There is no need for concern and it should clear up in a day or so.  SYMPTOMS TO REPORT IMMEDIATELY:   Following lower endoscopy (colonoscopy or flexible sigmoidoscopy):  Excessive amounts of blood in the stool  Significant tenderness or worsening of abdominal pains  Swelling of the abdomen that is new, acute  Fever of 100F or higher   Following upper endoscopy (EGD)  Vomiting of blood or coffee ground material  New chest pain or pain under the shoulder blades  Painful or persistently difficult swallowing  New shortness of breath  Fever of 100F or higher  Black, tarry-looking stools  For urgent or emergent issues, a gastroenterologist can be reached at any hour by calling  [phone].   DIET:  We do recommend a small meal at first, but then you may proceed to your regular diet.  Drink plenty of fluids but you should avoid alcoholic beverages for 24 hours.  ACTIVITY:  You should plan to take it easy for the rest of today and you should NOT DRIVE or use heavy machinery until tomorrow (because of the sedation medicines used during the test).    FOLLOW UP: Our staff will call the number listed on your records the next business day following your procedure to check on you and address any questions or concerns that you may have regarding the information given to you following your procedure. If we do not reach you, we will leave a message.  However, if you are feeling well and you are not experiencing any problems, there is no need to return our call.  We will assume that you have returned to your regular daily activities without incident.  If any biopsies were taken you will be contacted by phone or by letter within the next 1-3 weeks.  Please call us at [phone] if you have not heard about the biopsies in 3 weeks.    SIGNATURES/CONFIDENTIALITY: You and/or your care partner have signed paperwork which will be entered into your electronic medical record.  These signatures attest to the fact that that the information above on your After Visit Summary has been reviewed and is understood.  Full responsibility of the confidentiality of this discharge  information lies with you and/or your care-partner. 

## 2018-01-28 NOTE — Op Note (Signed)
[facility] Patient Name: Carl Mccullough Procedure Date: 01/28/2018 3:17 PM MRN: [id number] Endoscopist: Jerene Bears , MD Age: 62 Referring MD:  Date of Birth: 1955/09/14 Gender: Male Account #: [id number] Procedure:                Upper GI endoscopy Indications:              Sore throat, neck pain, ? acid reflux Medicines:                Monitored Anesthesia Care Procedure:                Pre-Anesthesia Assessment:                           - Prior to the procedure, a History and Physical                            was performed, and patient medications and                            allergies were reviewed. The patient's tolerance of                            previous anesthesia was also reviewed. The risks                            and benefits of the procedure and the sedation                            options and risks were discussed with the patient.                            All questions were answered, and informed consent                            was obtained. Prior Anticoagulants: The patient has                            taken no previous anticoagulant or antiplatelet                            agents. ASA Grade Assessment: II - A patient with                            mild systemic disease. After reviewing the risks                            and benefits, the patient was deemed in                            satisfactory condition to undergo the procedure.                           After obtaining informed consent, the endoscope was  passed under direct vision. Throughout the                            procedure, the patient's blood pressure, pulse, and                            oxygen saturations were monitored continuously. The                            Endoscope was introduced through the mouth, and                            advanced to the second part of duodenum. The upper                            GI endoscopy was  accomplished without difficulty.                            The patient tolerated the procedure well. Scope In: Scope Out: Findings:                 The examined esophagus was normal.                           The entire examined stomach was normal.                           The examined duodenum was normal. Complications:            No immediate complications. Estimated Blood Loss:     Estimated blood loss: none. Impression:               - Normal esophagus.                           - Normal stomach.                           - Normal examined duodenum.                           - No specimens collected. Recommendation:           - Patient has a contact number available for                            emergencies. The signs and symptoms of potential                            delayed complications were discussed with the                            patient. Return to normal activities tomorrow.                            Written discharge instructions were provided to the  patient.                           - Resume previous diet.                           - Continue present medications.                           - No evidence of inflammation or injury to suggest                            GERD. Jerene Bears, MD 01/28/2018 4:11:10 PM This report has been signed electronically.

## 2018-01-29 ENCOUNTER — Telehealth: Payer: Self-pay | Admitting: *Deleted

## 2018-01-29 NOTE — Telephone Encounter (Signed)
  Follow up Call-  Call back number 01/28/2018  Post procedure Call Back phone  # [phone] - WIFE  Permission to leave phone message Yes  Some recent data might be hidden     Patient questions:  Do you have a fever, pain , or abdominal swelling? No. Pain Score  0 *  Have you tolerated food without any problems? Yes.    Have you been able to return to your normal activities? Yes.    Do you have any questions about your discharge instructions: Diet   No. Medications  No. Follow up visit  No.  Do you have questions or concerns about your Care? No.  Actions: * If pain score is 4 or above: No action needed, pain <4.

## 2018-02-04 ENCOUNTER — Encounter: Payer: Self-pay | Admitting: Internal Medicine

## 2018-03-06 ENCOUNTER — Other Ambulatory Visit: Payer: Self-pay | Admitting: Family Medicine

## 2018-05-14 ENCOUNTER — Telehealth: Payer: Self-pay | Admitting: *Deleted

## 2018-05-14 MED ORDER — PREDNISONE 10 MG (21) PO TBPK: ORAL_TABLET | Freq: Every day | ORAL | 0 refills | Status: DC

## 2018-05-14 NOTE — Telephone Encounter (Signed)
Per Dr Laurance Flatten, Prednisone dose pack for poison oak RX sent in to [facility]

## 2018-05-21 ENCOUNTER — Other Ambulatory Visit: Payer: Self-pay | Admitting: Family Medicine

## 2018-05-21 MED ORDER — DOXYCYCLINE HYCLATE 100 MG PO TABS: ORAL_TABLET | ORAL | 1 refills | Status: DC

## 2018-06-22 ENCOUNTER — Encounter: Payer: Self-pay | Admitting: Family Medicine

## 2018-06-22 ENCOUNTER — Ambulatory Visit (INDEPENDENT_AMBULATORY_CARE_PROVIDER_SITE_OTHER): Payer: BLUE CROSS/BLUE SHIELD | Admitting: Family Medicine

## 2018-06-22 ENCOUNTER — Other Ambulatory Visit: Payer: Self-pay

## 2018-06-22 DIAGNOSIS — Z8052 Family history of malignant neoplasm of bladder: Secondary | ICD-10-CM

## 2018-06-22 DIAGNOSIS — E782 Mixed hyperlipidemia: Secondary | ICD-10-CM | POA: Diagnosis not present

## 2018-06-22 DIAGNOSIS — E559 Vitamin D deficiency, unspecified: Secondary | ICD-10-CM

## 2018-06-22 DIAGNOSIS — I10 Essential (primary) hypertension: Secondary | ICD-10-CM | POA: Diagnosis not present

## 2018-06-22 DIAGNOSIS — N400 Enlarged prostate without lower urinary tract symptoms: Secondary | ICD-10-CM | POA: Diagnosis not present

## 2018-06-22 DIAGNOSIS — N5201 Erectile dysfunction due to arterial insufficiency: Secondary | ICD-10-CM

## 2018-06-22 DIAGNOSIS — Z860100 Personal history of colon polyps, unspecified: Secondary | ICD-10-CM | POA: Insufficient documentation

## 2018-06-22 DIAGNOSIS — K5790 Diverticulosis of intestine, part unspecified, without perforation or abscess without bleeding: Secondary | ICD-10-CM | POA: Insufficient documentation

## 2018-06-22 NOTE — Patient Instructions (Addendum)
At your convenience within the next 4 weeks, come by the office for lab work Continue to drink plenty of fluids and stay well-hydrated Continue to be more proactive with weight loss through diet and exercise Continue to practice good hand and respiratory hygiene Follow-up as planned with Dr. Elmo Putt for future colonoscopies, with a next colonoscopy being planned this December

## 2018-06-22 NOTE — Addendum Note (Signed)
Addended by: Marin Olp on: 06/22/2018 04:10 PM   Modules accepted: Orders

## 2018-06-22 NOTE — Progress Notes (Signed)
Virtual Visit Via telephone Note I connected with@ on 06/22/18 by telephone and verified that I am speaking with the correct person or authorized healthcare agent using two identifiers. Carl Mccullough is currently located at home and there are no unauthorized people in close proximity. I completed this visit while in a private location in my home .  Negative to the patient by telephone and verified that I was speaking with the correct person.  This visit type was conducted due to national recommendations for restrictions regarding the COVID-19 Pandemic (e.g. social distancing).  This format is felt to be most appropriate for this patient at this time.  All issues noted in this document were discussed and addressed.  No physical exam was performed.    I discussed the limitations, risks, security and privacy concerns of performing an evaluation and management service by telephone and the availability of in person appointments. I also discussed with the patient that there may be a patient responsible charge related to this service. The patient expressed understanding and agreed to proceed.   Date:  06/22/2018    ID:  Carl Mccullough      Feb 05, 1956        [id number]   Patient Care Team Patient Care Team: Chipper Herb, MD as PCP - General (Family Medicine)  Reason for Visit: Primary Care Follow-up     History of Present Illness & Review of Systems:     Carl Mccullough is a 63 y.o. year old male primary care patient that presents today for a telehealth visit.  The patient is doing well overall.  The wife was present during the conversation the patient.  She helps manage his health care at home.  The patient did have a colonoscopy in December and had multiple polyps many of which were precancerous and will be due another colonoscopy in 1 year from this past December.  It would be December of this year.  He denies any chest pain or shortness of breath anymore than usual.  He denies any change  in bowel habits and denies any heartburn indigestion nausea vomiting diarrhea blood in the stool or black tarry bowel movements.  He tries to drink plenty of water.  He is passing his water well.  He does not need any refills on his generic Viagra.  His eye exam is past due due to the [place] pandemic.  They do have plans to get eye exams and they will tell them about the fact that he occasionally has red eyes after working outside and coming in the house.  He does not have any itching.  He does not have any visual changes.  Review of systems as stated otherwise negative for body systems left unmentioned.   The patient does not have symptoms concerning for COVID-19 infection (fever, chills, cough, or new shortness of breath).      Current Medications (Verified) Allergies as of 06/22/2018   No Known Allergies     Medication List       Accurate as of Jun 22, 2018  1:21 PM. Always use your most recent med list.        albuterol 108 (90 Base) MCG/ACT inhaler Commonly known as:  VENTOLIN HFA Inhale 1-2 puffs into the lungs every 6 (six) hours as needed for wheezing or shortness of breath.   cetirizine 10 MG tablet Commonly known as:  ZYRTEC Take 1 tablet (10 mg total) by mouth daily.   doxycycline 100  MG tablet Commonly known as:  VIBRA-TABS 1 twice daily with food for 3 weeks for tick bite   fluticasone 50 MCG/ACT nasal spray Commonly known as:  FLONASE INSTILL 1-2 SPRAYS IN EACH NOSTRIL AT BEDTIME   montelukast 10 MG tablet Commonly known as:  SINGULAIR Take 1 tablet (10 mg total) by mouth at bedtime.   omeprazole 40 MG capsule Commonly known as:  PRILOSEC Take 40 mg by mouth every evening.   predniSONE 10 MG (21) Tbpk tablet Commonly known as:  STERAPRED UNI-PAK 21 TAB Take by mouth daily. As directed x 6 days   sildenafil 20 MG tablet Commonly known as:  REVATIO Take 2-5 tabs PRN prior to sexual activity.   Vitamin D (Ergocalciferol) 1.25 MG (50000 UT) Caps  capsule Commonly known as:  DRISDOL TAKE 1 CAPSULE (50,000 UNITS TOTAL) BY MOUTH EVERY 7 (SEVEN) DAYS.           Allergies (Verified)    Patient has no known allergies.  Past Medical History Past Medical History:  Diagnosis Date   Benign prostatic hyperplasia    Diverticulitis    GERD (gastroesophageal reflux disease)    Hyperlipidemia    Hypertension    Vitamin D deficiency      Past Surgical History:  Procedure Laterality Date   CYSTOSCOPY  [id number]   TONSILLECTOMY      Social History   Socioeconomic History   Marital status: Married    Spouse name: Not on file   Number of children: Not on file   Years of education: Not on file   Highest education level: Not on file  Occupational History   Not on file  Social Needs   Financial resource strain: Not on file   Food insecurity:    Worry: Not on file    Inability: Not on file   Transportation needs:    Medical: Not on file    Non-medical: Not on file  Tobacco Use   Smoking status: Never Smoker   Smokeless tobacco: Never Used  Substance and Sexual Activity   Alcohol use: Yes   Drug use: No   Sexual activity: Yes  Lifestyle   Physical activity:    Days per week: Not on file    Minutes per session: Not on file   Stress: Not on file  Relationships   Social connections:    Talks on phone: Not on file    Gets together: Not on file    Attends religious service: Not on file    Active member of club or organization: Not on file    Attends meetings of clubs or organizations: Not on file    Relationship status: Not on file  Other Topics Concern   Not on file  Social History Narrative   Not on file     Family History  Problem Relation Age of Onset   Hypertension Mother    Alzheimer's disease Mother    Chronic Renal Failure Mother    Colon cancer Father    Bladder Cancer Father    Hypertension Brother    Rectal cancer Neg Hx    Stomach cancer Neg Hx    Esophageal  cancer Neg Hx       Labs/Other Tests and Data Reviewed:    Wt Readings from Last 3 Encounters:  01/28/18 187 lb (84.8 kg)  12/30/17 187 lb 2 oz (84.9 kg)  12/22/17 189 lb (85.7 kg)   Temp Readings from Last 3 Encounters:  01/28/18 98.2 F (  36.8 C)  12/22/17 98.8 F (37.1 C) (Oral)  06/18/17 97.8 F (36.6 C) (Oral)   BP Readings from Last 3 Encounters:  01/28/18 123/87  12/30/17 124/90  12/22/17 136/87   Pulse Readings from Last 3 Encounters:  01/28/18 (!) 58  12/30/17 80  12/22/17 79     No results found for: HGBA1C Lab Results  Component Value Date   LDLCALC 99 12/22/2017   CREATININE 1.10 12/22/2017       Chemistry      Component Value Date/Time   NA 135 12/22/2017 1719   K 4.0 12/22/2017 1719   CL 98 12/22/2017 1719   CO2 22 12/22/2017 1719   BUN 12 12/22/2017 1719   CREATININE 1.10 12/22/2017 1719   CREATININE 0.92 01/03/2013 0832      Component Value Date/Time   CALCIUM 9.3 12/22/2017 1719   ALKPHOS 86 12/22/2017 1719   AST 19 12/22/2017 1719   ALT 20 12/22/2017 1719   BILITOT 0.3 12/22/2017 1719         OBSERVATIONS/ OBJECTIVE:     The patient's weight is currently 185 and he admits to the fact he needs to lose some of that he knows that this will help his blood pressure also.  His blood pressure has been somewhat variable.  And one arm yesterday is 117/70 and another arm it was 140/90.  Today he checked it or early this morning he checked and it was 120/70 in both arms.  He is feeling well and he is alert and he stays active physically.  He continues to work at an [employer].  Physical exam deferred due to nature of telephonic visit.  ASSESSMENT & PLAN    Time:   Today, I have spent 26 minutes with the patient via telephone discussing the above including Covid precautions.     Visit Diagnoses: 1. Vitamin D deficiency -Continue with vitamin D replacement  2. Essential hypertension -Watch sodium intake, eat more  healthy, lose weight, drink more water.  Continue to monitor blood pressures at home.  Patient currently not on any medication.  3. Mixed hyperlipidemia -Check lipid liver panel within the next month.  Watch diet more closely and lose weight  4. Benign prostatic hyperplasia without lower urinary tract symptoms -Patient is passing his water without problems.  He has not noticed any blood in the urine.  5. Family history of bladder cancer -Urinalysis at next blood draw because of this family history.  6. Erectile dysfunction due to arterial insufficiency -Patient will keep the generic Viagra on hand but currently he is not using this.  There are no medications that need to be refilled currently.  Patient will come by the office within the next 3 to 4 weeks and get blood work done which should include vitamin D lipid liver urinalysis BMP and thyroid if not done 6 months ago.   The above assessment and management plan was discussed with the patient. The patient verbalized understanding of and has agreed to the management plan. Patient is aware to call the clinic if symptoms persist or worsen. Patient is aware when to return to the clinic for a follow-up visit. Patient educated on when it is appropriate to go to the emergency department.    Chipper Herb, MD [facility] [address] [address], [place], [place] [address] Ph [phone]  Arrie Senate MD

## 2018-07-26 ENCOUNTER — Other Ambulatory Visit: Payer: Self-pay | Admitting: Family Medicine

## 2018-08-03 ENCOUNTER — Telehealth: Payer: Self-pay | Admitting: Family Medicine

## 2018-08-03 ENCOUNTER — Other Ambulatory Visit: Payer: Self-pay

## 2018-08-03 ENCOUNTER — Other Ambulatory Visit: Payer: BC Managed Care – PPO

## 2018-08-03 DIAGNOSIS — E782 Mixed hyperlipidemia: Secondary | ICD-10-CM

## 2018-08-03 DIAGNOSIS — I10 Essential (primary) hypertension: Secondary | ICD-10-CM

## 2018-08-03 DIAGNOSIS — E559 Vitamin D deficiency, unspecified: Secondary | ICD-10-CM

## 2018-08-03 DIAGNOSIS — N400 Enlarged prostate without lower urinary tract symptoms: Secondary | ICD-10-CM

## 2018-08-03 DIAGNOSIS — K5790 Diverticulosis of intestine, part unspecified, without perforation or abscess without bleeding: Secondary | ICD-10-CM

## 2018-08-03 LAB — URINALYSIS, COMPLETE
Bilirubin, UA: NEGATIVE
Glucose, UA: NEGATIVE
Ketones, UA: NEGATIVE
Leukocytes,UA: NEGATIVE
Nitrite, UA: NEGATIVE
Protein,UA: NEGATIVE
RBC, UA: NEGATIVE
Specific Gravity, UA: 1.01 (ref 1.005–1.030)
Urobilinogen, Ur: 0.2 mg/dL (ref 0.2–1.0)
pH, UA: 6 (ref 5.0–7.5)

## 2018-08-03 LAB — MICROSCOPIC EXAMINATION
Bacteria, UA: NONE SEEN
Epithelial Cells (non renal): NONE SEEN /hpf (ref 0–10)
RBC, Urine: NONE SEEN /hpf (ref 0–2)
Renal Epithel, UA: NONE SEEN /hpf
WBC, UA: NONE SEEN /hpf (ref 0–5)

## 2018-08-03 LAB — LIPID PANEL

## 2018-08-03 NOTE — Telephone Encounter (Signed)
Aware.  No blood work was drawn. Future orders pended.

## 2018-08-04 LAB — CBC WITH DIFFERENTIAL/PLATELET
Basophils Absolute: 0 10*3/uL (ref 0.0–0.2)
Basos: 1 %
EOS (ABSOLUTE): 0.2 10*3/uL (ref 0.0–0.4)
Eos: 4 %
Hematocrit: 44.4 % (ref 37.5–51.0)
Hemoglobin: 15.3 g/dL (ref 13.0–17.7)
Immature Grans (Abs): 0 10*3/uL (ref 0.0–0.1)
Immature Granulocytes: 0 %
Lymphocytes Absolute: 2.2 10*3/uL (ref 0.7–3.1)
Lymphs: 37 %
MCH: 32.2 pg (ref 26.6–33.0)
MCHC: 34.5 g/dL (ref 31.5–35.7)
MCV: 94 fL (ref 79–97)
Monocytes Absolute: 0.6 10*3/uL (ref 0.1–0.9)
Monocytes: 10 %
Neutrophils Absolute: 3 10*3/uL (ref 1.4–7.0)
Neutrophils: 48 %
Platelets: 262 10*3/uL (ref 150–450)
RBC: 4.75 x10E6/uL (ref 4.14–5.80)
RDW: 12.6 % (ref 11.6–15.4)
WBC: 6 10*3/uL (ref 3.4–10.8)

## 2018-08-04 LAB — THYROID PANEL WITH TSH
Free Thyroxine Index: 1.4 (ref 1.2–4.9)
T3 Uptake Ratio: 26 % (ref 24–39)
T4, Total: 5.4 ug/dL (ref 4.5–12.0)
TSH: 2.91 u[IU]/mL (ref 0.450–4.500)

## 2018-08-04 LAB — HEPATIC FUNCTION PANEL
ALT: 20 IU/L (ref 0–44)
AST: 19 IU/L (ref 0–40)
Albumin: 4.3 g/dL (ref 3.8–4.8)
Alkaline Phosphatase: 88 IU/L (ref 39–117)
Bilirubin Total: 0.4 mg/dL (ref 0.0–1.2)
Bilirubin, Direct: 0.13 mg/dL (ref 0.00–0.40)
Total Protein: 6.9 g/dL (ref 6.0–8.5)

## 2018-08-04 LAB — LIPID PANEL
Chol/HDL Ratio: 2.5 ratio (ref 0.0–5.0)
Cholesterol, Total: 159 mg/dL (ref 100–199)
HDL: 64 mg/dL (ref >39)
LDL Calculated: 79 mg/dL (ref 0–99)
Triglycerides: 81 mg/dL (ref 0–149)
VLDL Cholesterol Cal: 16 mg/dL (ref 5–40)

## 2018-08-04 LAB — BASIC METABOLIC PANEL
BUN/Creatinine Ratio: 11 (ref 10–24)
BUN: 11 mg/dL (ref 8–27)
CO2: 23 mmol/L (ref 20–29)
Calcium: 9.3 mg/dL (ref 8.6–10.2)
Chloride: 101 mmol/L (ref 96–106)
Creatinine, Ser: 1.03 mg/dL (ref 0.76–1.27)
GFR calc Af Amer: 88 mL/min/{1.73_m2} (ref >59)
GFR calc non Af Amer: 76 mL/min/{1.73_m2} (ref >59)
Glucose: 81 mg/dL (ref 65–99)
Potassium: 4.8 mmol/L (ref 3.5–5.2)
Sodium: 136 mmol/L (ref 134–144)

## 2018-08-04 LAB — VITAMIN D 25 HYDROXY (VIT D DEFICIENCY, FRACTURES): Vit D, 25-Hydroxy: 39.4 ng/mL (ref 30.0–100.0)

## 2018-08-17 ENCOUNTER — Other Ambulatory Visit: Payer: Self-pay | Admitting: *Deleted

## 2018-08-17 DIAGNOSIS — J302 Other seasonal allergic rhinitis: Secondary | ICD-10-CM

## 2018-08-17 MED ORDER — VITAMIN D (ERGOCALCIFEROL) 1.25 MG (50000 UNIT) PO CAPS: 50000.0000 [IU] | ORAL_CAPSULE | ORAL | 3 refills | Status: DC

## 2018-08-17 MED ORDER — LORATADINE 10 MG PO TABS: 10.0000 mg | ORAL_TABLET | Freq: Every day | ORAL | 3 refills | Status: DC

## 2018-08-17 MED ORDER — DOXYCYCLINE HYCLATE 100 MG PO TABS: ORAL_TABLET | ORAL | 5 refills | Status: DC

## 2018-08-17 MED ORDER — FLUTICASONE PROPIONATE 50 MCG/ACT NA SUSP: NASAL | 3 refills | Status: DC

## 2018-10-20 IMAGING — DX DG CHEST 2V
2 series · 2 of 2 positions shown · non-contrast
Comparison: 12/08/2014 chest radiograph

CLINICAL DATA: Hypertension.

EXAM:
CHEST  2 VIEW

[chest pa]
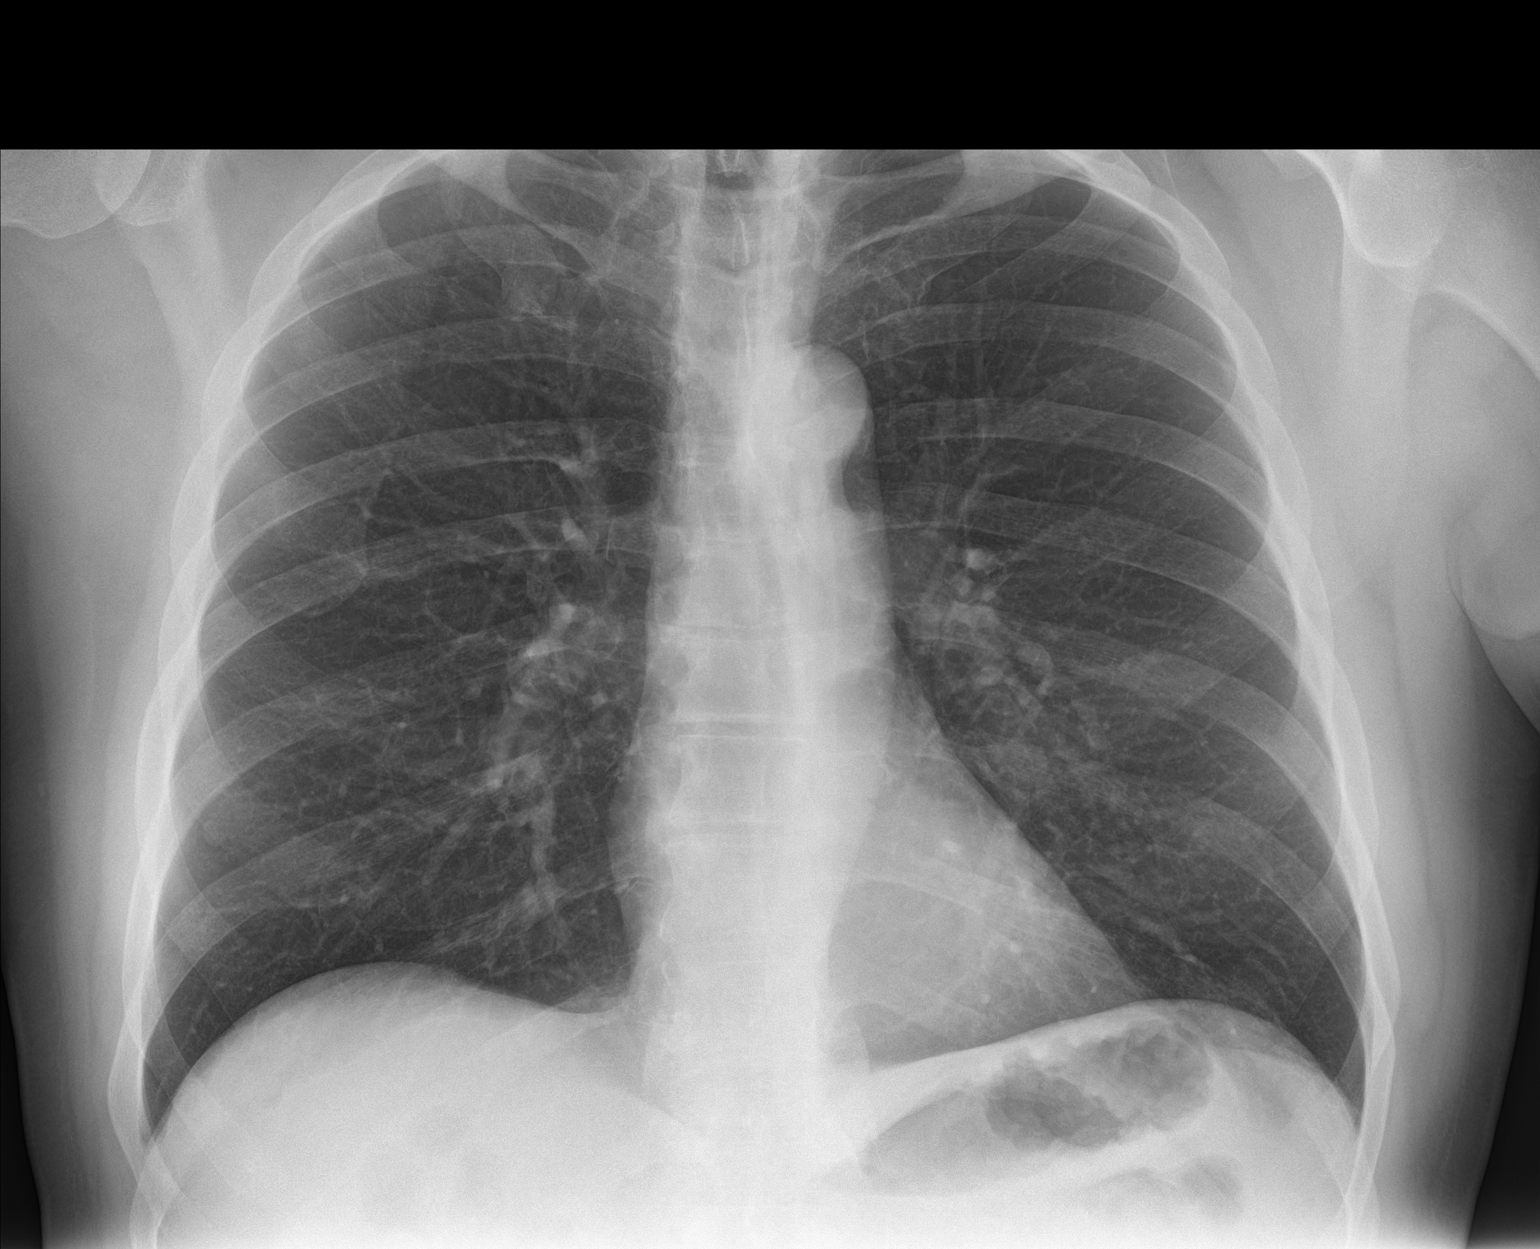

[chest lat]
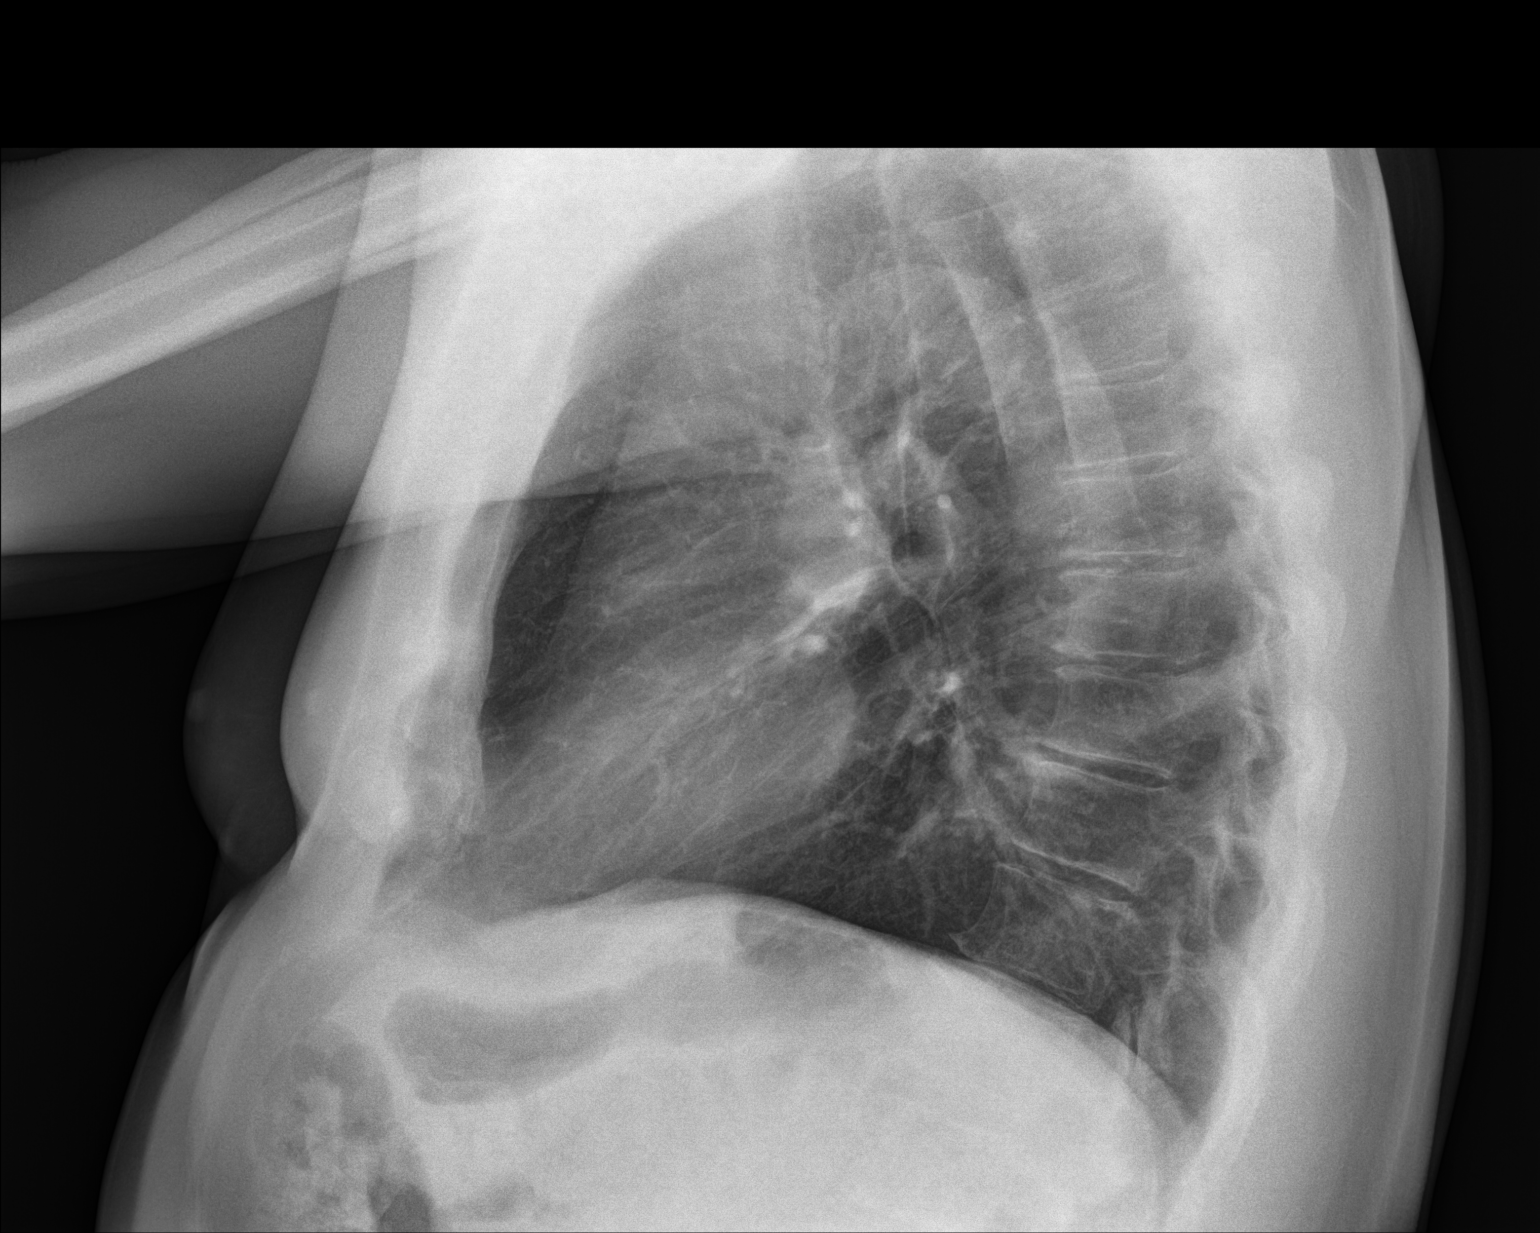

[2 of 2 positions shown; findings below may reference images not displayed]

FINDINGS: The cardiomediastinal silhouette is unremarkable.

There is no evidence of focal airspace disease, pulmonary edema,
suspicious pulmonary nodule/mass, pleural effusion, or pneumothorax.
No acute bony abnormalities are identified.
IMPRESSION: No active cardiopulmonary disease.

## 2018-11-05 ENCOUNTER — Telehealth: Payer: Self-pay | Admitting: Internal Medicine

## 2018-11-05 MED ORDER — METRONIDAZOLE 500 MG PO TABS: 500.0000 mg | ORAL_TABLET | Freq: Three times a day (TID) | ORAL | 0 refills | Status: DC

## 2018-11-05 MED ORDER — CIPROFLOXACIN HCL 500 MG PO TABS: 500.0000 mg | ORAL_TABLET | Freq: Two times a day (BID) | ORAL | 0 refills | Status: DC

## 2018-11-05 NOTE — Telephone Encounter (Signed)
Pyrtle pt with h/o diverticulitis. Was started on cipro and flagyl by PCP and this is day 5 of treatment. Has enough to cover the weekend but states the LLQ pain is not any better. Called PCP and he was instructed to call GI. Pt wants to know if he needs more or different meds. Please as advise as DOD.

## 2018-11-05 NOTE — Telephone Encounter (Signed)
Lets do Cipro 500 BID and Flagyl 500 mg p.o. TID x total of 10 days. (so, he may need 3 more days of antibiotics). No ETOH while taking Flagyl. If he is not better by Monday, check CBC, CMP and set him up for a CT scan Abdo/pelvis. Please make sure he is not running any fever or chills. Needs to be on low fiber diet while on antibiotics. Can take probiotics of his choice 1/day x 14 days.  Thanks   RG

## 2018-11-05 NOTE — Telephone Encounter (Signed)
Spoke with pt and he is aware. States he has not had a fever. Scripts sent to pharmacy, pt knows to call on Monday if no better.

## 2018-11-10 ENCOUNTER — Other Ambulatory Visit: Payer: Self-pay

## 2018-11-10 ENCOUNTER — Other Ambulatory Visit (INDEPENDENT_AMBULATORY_CARE_PROVIDER_SITE_OTHER): Payer: BC Managed Care – PPO

## 2018-11-10 DIAGNOSIS — R1032 Left lower quadrant pain: Secondary | ICD-10-CM

## 2018-11-10 LAB — COMPREHENSIVE METABOLIC PANEL
ALT: 24 U/L (ref 0–53)
AST: 21 U/L (ref 0–37)
Albumin: 4.4 g/dL (ref 3.5–5.2)
Alkaline Phosphatase: 79 U/L (ref 39–117)
BUN: 15 mg/dL (ref 6–23)
CO2: 27 mEq/L (ref 19–32)
Calcium: 9.7 mg/dL (ref 8.4–10.5)
Chloride: 100 mEq/L (ref 96–112)
Creatinine, Ser: 1.05 mg/dL (ref 0.40–1.50)
GFR: 70.87 mL/min (ref >60.00)
Glucose, Bld: 88 mg/dL (ref 70–99)
Potassium: 4.9 mEq/L (ref 3.5–5.1)
Sodium: 135 mEq/L (ref 135–145)
Total Bilirubin: 0.3 mg/dL (ref 0.2–1.2)
Total Protein: 7.4 g/dL (ref 6.0–8.3)

## 2018-11-10 LAB — CBC WITH DIFFERENTIAL/PLATELET
Basophils Absolute: 0.1 10*3/uL (ref 0.0–0.1)
Basophils Relative: 0.8 % (ref 0.0–3.0)
Eosinophils Absolute: 0.1 10*3/uL (ref 0.0–0.7)
Eosinophils Relative: 1.3 % (ref 0.0–5.0)
HCT: 45.7 % (ref 39.0–52.0)
Hemoglobin: 15.4 g/dL (ref 13.0–17.0)
Lymphocytes Relative: 37.4 % (ref 12.0–46.0)
Lymphs Abs: 2.8 10*3/uL (ref 0.7–4.0)
MCHC: 33.8 g/dL (ref 30.0–36.0)
MCV: 96.7 fl (ref 78.0–100.0)
Monocytes Absolute: 0.7 10*3/uL (ref 0.1–1.0)
Monocytes Relative: 10 % (ref 3.0–12.0)
Neutro Abs: 3.8 10*3/uL (ref 1.4–7.7)
Neutrophils Relative %: 50.5 % (ref 43.0–77.0)
Platelets: 253 10*3/uL (ref 150.0–400.0)
RBC: 4.72 Mil/uL (ref 4.22–5.81)
RDW: 13 % (ref 11.5–15.5)
WBC: 7.5 10*3/uL (ref 4.0–10.5)

## 2018-11-10 NOTE — Telephone Encounter (Signed)
Would proceed with CBC, CMP and CT scan of the abdomen pelvis with contrast

## 2018-11-10 NOTE — Telephone Encounter (Signed)
Pt is calilng back today stating diverticulitis symptoms are not any better. Please see note below from Dr. Lyndel Safe. Do you want to proceed with labs and CT as below?

## 2018-11-10 NOTE — Telephone Encounter (Signed)
Pt reported that he is feeling the same diverticulitis discomfort.  Please advise.

## 2018-11-10 NOTE — Telephone Encounter (Signed)
Pt to have labs today, orders in epic. Pt scheduled for CT of A/P at [facility] CT 11/11/18@10 :45am, pt to arrive there at 10:30am. Pt to be NPO after 6:45am, drink bottle 1 of contrast at 8:45am and bottle 2 at 9:45am. Pt aware of appt and picking up contrast from office.

## 2018-11-11 ENCOUNTER — Inpatient Hospital Stay: Admission: RE | Admit: 2018-11-11 | Payer: BLUE CROSS/BLUE SHIELD | Source: Ambulatory Visit

## 2018-11-11 ENCOUNTER — Ambulatory Visit (INDEPENDENT_AMBULATORY_CARE_PROVIDER_SITE_OTHER)
Admission: RE | Admit: 2018-11-11 | Discharge: 2018-11-11 | Disposition: A | Discharge status: Home / Self Care | Payer: BC Managed Care – PPO | Source: Ambulatory Visit | Attending: Internal Medicine | Admitting: Internal Medicine

## 2018-11-11 ENCOUNTER — Other Ambulatory Visit: Payer: Self-pay

## 2018-11-11 DIAGNOSIS — R1032 Left lower quadrant pain: Secondary | ICD-10-CM | POA: Diagnosis not present

## 2018-11-11 MED ORDER — IOHEXOL 300 MG/ML  SOLN
100.0000 mL | Freq: Once | INTRAMUSCULAR | Status: AC | PRN
  Administered 2018-11-11: 15:00:00 100 mL via INTRAVENOUS

## 2018-11-12 ENCOUNTER — Telehealth: Payer: Self-pay | Admitting: Internal Medicine

## 2018-11-12 NOTE — Telephone Encounter (Signed)
Pt inquired about results of CT.  

## 2018-11-15 NOTE — Telephone Encounter (Signed)
Pt calling for CT results

## 2018-11-15 NOTE — Telephone Encounter (Signed)
See CT result note

## 2018-12-10 ENCOUNTER — Ambulatory Visit: Payer: BC Managed Care – PPO | Admitting: Physician Assistant

## 2018-12-10 ENCOUNTER — Telehealth: Payer: Self-pay | Admitting: Internal Medicine

## 2018-12-10 ENCOUNTER — Other Ambulatory Visit: Payer: Self-pay

## 2018-12-10 ENCOUNTER — Encounter: Payer: Self-pay | Admitting: Physician Assistant

## 2018-12-10 ENCOUNTER — Other Ambulatory Visit (INDEPENDENT_AMBULATORY_CARE_PROVIDER_SITE_OTHER): Payer: BC Managed Care – PPO

## 2018-12-10 VITALS — BP 146/100 | HR 84 | Temp 98.6°F | Ht 68.5 in | Wt 190.0 lb

## 2018-12-10 DIAGNOSIS — R1084 Generalized abdominal pain: Secondary | ICD-10-CM | POA: Diagnosis not present

## 2018-12-10 DIAGNOSIS — Z86010 Personal history of colonic polyps: Secondary | ICD-10-CM

## 2018-12-10 DIAGNOSIS — Z860101 Personal history of adenomatous and serrated colon polyps: Secondary | ICD-10-CM

## 2018-12-10 LAB — COMPREHENSIVE METABOLIC PANEL
ALT: 16 U/L (ref 0–53)
AST: 15 U/L (ref 0–37)
Albumin: 4.5 g/dL (ref 3.5–5.2)
Alkaline Phosphatase: 89 U/L (ref 39–117)
BUN: 13 mg/dL (ref 6–23)
CO2: 25 mEq/L (ref 19–32)
Calcium: 9.5 mg/dL (ref 8.4–10.5)
Chloride: 102 mEq/L (ref 96–112)
Creatinine, Ser: 0.98 mg/dL (ref 0.40–1.50)
GFR: 76.72 mL/min (ref >60.00)
Glucose, Bld: 88 mg/dL (ref 70–99)
Potassium: 4.3 mEq/L (ref 3.5–5.1)
Sodium: 136 mEq/L (ref 135–145)
Total Bilirubin: 0.3 mg/dL (ref 0.2–1.2)
Total Protein: 7.8 g/dL (ref 6.0–8.3)

## 2018-12-10 LAB — CBC WITH DIFFERENTIAL/PLATELET
Basophils Absolute: 0 10*3/uL (ref 0.0–0.1)
Basophils Relative: 0.5 % (ref 0.0–3.0)
Eosinophils Absolute: 0.1 10*3/uL (ref 0.0–0.7)
Eosinophils Relative: 1.2 % (ref 0.0–5.0)
HCT: 45.4 % (ref 39.0–52.0)
Hemoglobin: 15.5 g/dL (ref 13.0–17.0)
Lymphocytes Relative: 35.1 % (ref 12.0–46.0)
Lymphs Abs: 2.2 10*3/uL (ref 0.7–4.0)
MCHC: 34 g/dL (ref 30.0–36.0)
MCV: 95.9 fl (ref 78.0–100.0)
Monocytes Absolute: 0.5 10*3/uL (ref 0.1–1.0)
Monocytes Relative: 8 % (ref 3.0–12.0)
Neutro Abs: 3.4 10*3/uL (ref 1.4–7.7)
Neutrophils Relative %: 55.2 % (ref 43.0–77.0)
Platelets: 235 10*3/uL (ref 150.0–400.0)
RBC: 4.74 Mil/uL (ref 4.22–5.81)
RDW: 12.6 % (ref 11.5–15.5)
WBC: 6.2 10*3/uL (ref 4.0–10.5)

## 2018-12-10 MED ORDER — NA SULFATE-K SULFATE-MG SULF 17.5-3.13-1.6 GM/177ML PO SOLN: 1.0000 | Freq: Once | ORAL | 0 refills | Status: AC

## 2018-12-10 MED ORDER — DICYCLOMINE HCL 10 MG PO CAPS: ORAL_CAPSULE | ORAL | 3 refills | Status: DC

## 2018-12-10 NOTE — Telephone Encounter (Signed)
Pt reported that he is having LUQ discomfort.  He feels that "something is swollen inside."

## 2018-12-10 NOTE — Telephone Encounter (Signed)
Pt continues to have LLQ pain. Area under rib cage on left side tender and along belt line on left side tender. Pt states he feels like something is swollen in there. Pt scheduled to see Ellouise Newer PA today at 1:30pm. Pt aware of appt.

## 2018-12-10 NOTE — Progress Notes (Signed)
Chief Complaint: Abdominal pain  HPI:    Carl Mccullough is a 63 year old male with a past medical history as listed below including colon polyps, diverticulosis with history of diverticulitis, possible GERD, hypertension, hyperlipidemia, known to Dr. Hilarie Fredrickson, who presents clinic today for complaint of abdominal pain.    12/30/2017 patient seen in clinic by Dr. Hilarie Fredrickson and at that time describes some left lower quadrant abdominal pain which just started as well as some tightness in his neck.  He was started on Cipro and Flagyl for suspected diverticulitis.  Also scheduled for a surveillance colonoscopy given his history of colon polyps and family history of colon cancer.    01/28/2018 EGD was normal.  Colonoscopy with 13 polyps.  These were all tubular adenomas.  It was recommended he have repeat in 1 year.    11/05/2018 patient called in and described that he had diverticulitis.  He had been started on Flagyl and Cipro by PCP and was on his fifth day of treatment but pain had not gotten any better.  He was given a total round of 10 days.  Called back in 3 days later and was continue with pain had CBC CMP and CT scan of the abdomen pelvis with contrast.    11/11/2018 CT showed extensive colonic diverticulosis without active diverticulitis and degenerative disc disease in his lumbar and sacral spine as well as bilateral inguinal hernias without obstruction.  Patient was called and explained that symptoms started to go away.  Is recommend the patient try align once daily or Florastor 250 mg twice daily for a month.    12/10/2018 patient called and described left upper quadrant discomfort and feeling "something swollen inside".  He was given an urgent add-on appointment today.    Today, the patient presents clinic accompanied by his wife who does assist with his history.  He explains that he was on antibiotics initially for total of 3 weeks for suspected diverticulitis.  He has not taken antibiotics over the past 2  to 3 weeks and his symptoms are unchanged..  Tells me that he feels a pressure, denies any actual pain, in his left lower quadrant, left upper quadrant and right lower quadrant, mostly when he pushes on these areas.  He can feel this throughout the day, not necessarily worse after he eats, no change with a bowel movement or passing gas.  He has been on probiotics once daily as suggested by Dr. Hilarie Fredrickson.    Denies fever, chills, blood in his stool, change in bowel habits or symptoms that awaken him from sleep.  Past Medical History:  Diagnosis Date   Benign prostatic hyperplasia    Diverticulitis    GERD (gastroesophageal reflux disease)    Hyperlipidemia    Hypertension    Vitamin D deficiency     Past Surgical History:  Procedure Laterality Date   CYSTOSCOPY  [id number]   TONSILLECTOMY      Current Outpatient Medications  Medication Sig Dispense Refill   albuterol (PROVENTIL HFA;VENTOLIN HFA) 108 (90 Base) MCG/ACT inhaler Inhale 1-2 puffs into the lungs every 6 (six) hours as needed for wheezing or shortness of breath. (Patient not taking: Reported on 06/22/2018) 1 Inhaler 1   cetirizine (ZYRTEC) 10 MG tablet Take 1 tablet (10 mg total) by mouth daily. 30 tablet 11   ciprofloxacin (CIPRO) 500 MG tablet Take 1 tablet (500 mg total) by mouth 2 (two) times daily. 6 tablet 0   doxycycline (VIBRA-TABS) 100 MG tablet TAKE 1 TABLET BY  MOUTH TWICE A DAY FOR 3 WEEKS FOR TICK BITE 60 tablet 5   fluticasone (FLONASE) 50 MCG/ACT nasal spray INSTILL 1-2 SPRAYS IN EACH NOSTRIL AT BEDTIME 48 g 3   loratadine (CLARITIN) 10 MG tablet Take 1 tablet (10 mg total) by mouth daily. 90 tablet 3   metroNIDAZOLE (FLAGYL) 500 MG tablet Take 1 tablet (500 mg total) by mouth 3 (three) times daily. 9 tablet 0   montelukast (SINGULAIR) 10 MG tablet Take 1 tablet (10 mg total) by mouth at bedtime. (Patient not taking: Reported on 01/28/2018) 30 tablet 3   omeprazole (PRILOSEC) 40 MG capsule Take 40 mg by  mouth every evening.     predniSONE (STERAPRED UNI-PAK 21 TAB) 10 MG (21) TBPK tablet Take by mouth daily. As directed x 6 days (Patient not taking: Reported on 06/22/2018) 21 tablet 0   sildenafil (REVATIO) 20 MG tablet Take 2-5 tabs PRN prior to sexual activity. 50 tablet 11   Vitamin D, Ergocalciferol, (DRISDOL) 1.25 MG (50000 UT) CAPS capsule Take 1 capsule (50,000 Units total) by mouth every 7 (seven) days. 12 capsule 3   Current Facility-Administered Medications  Medication Dose Route Frequency Provider Last Rate Last Dose   0.9 %  sodium chloride infusion  500 mL Intravenous Once Pyrtle, Lajuan Lines, MD        Allergies as of 12/10/2018   (No Known Allergies)    Family History  Problem Relation Age of Onset   Hypertension Mother    Alzheimer's disease Mother    Chronic Renal Failure Mother    Colon cancer Father    Bladder Cancer Father    Hypertension Brother    Rectal cancer Neg Hx    Stomach cancer Neg Hx    Esophageal cancer Neg Hx     Social History   Socioeconomic History   Marital status: Married    Spouse name: Not on file   Number of children: Not on file   Years of education: Not on file   Highest education level: Not on file  Occupational History   Not on file  Social Needs   Financial resource strain: Not on file   Food insecurity    Worry: Not on file    Inability: Not on file   Transportation needs    Medical: Not on file    Non-medical: Not on file  Tobacco Use   Smoking status: Never Smoker   Smokeless tobacco: Never Used  Substance and Sexual Activity   Alcohol use: Yes   Drug use: No   Sexual activity: Yes  Lifestyle   Physical activity    Days per week: Not on file    Minutes per session: Not on file   Stress: Not on file  Relationships   Social connections    Talks on phone: Not on file    Gets together: Not on file    Attends religious service: Not on file    Active member of club or organization: Not on  file    Attends meetings of clubs or organizations: Not on file    Relationship status: Not on file   Intimate partner violence    Fear of current or ex partner: Not on file    Emotionally abused: Not on file    Physically abused: Not on file    Forced sexual activity: Not on file  Other Topics Concern   Not on file  Social History Narrative   Not on file  Review of Systems:    Constitutional: No weight loss, fever or chills Cardiovascular: No chest pain   Respiratory: No SOB  Gastrointestinal: See HPI and otherwise negative   Physical Exam:  Vital signs: BP (!) 146/100 (BP Location: Left Arm, Patient Position: Sitting, Cuff Size: Normal)    Pulse 84    Temp 98.6 F (37 C)    Ht 5' 8.5" (1.74 m) Comment: height measured without shoes   Wt 190 lb (86.2 kg)    BMI 28.47 kg/m   Constitutional:   Pleasant Caucasian male appears to be in NAD, Well developed, Well nourished, alert and cooperative Respiratory: Respirations even and unlabored. Lungs clear to auscultation bilaterally.   No wheezes, crackles, or rhonchi.  Cardiovascular: Normal S1, S2. No MRG. Regular rate and rhythm. No peripheral edema, cyanosis or pallor.  Gastrointestinal:  Soft, nondistended, mild LLQ, RLQ and LUQ ttp. No rebound or guarding. Normal bowel sounds. No appreciable masses or hepatomegaly. Rectal:  Not performed.  Psychiatric: Demonstrates good judgement and reason without abnormal affect or behaviors.  RELEVANT LABS AND IMAGING: CBC    Component Value Date/Time   WBC 7.5 11/10/2018 1638   RBC 4.72 11/10/2018 1638   HGB 15.4 11/10/2018 1638   HGB 15.3 08/03/2018 0937   HCT 45.7 11/10/2018 1638   HCT 44.4 08/03/2018 0937   PLT 253.0 11/10/2018 1638   PLT 262 08/03/2018 0937   MCV 96.7 11/10/2018 1638   MCV 94 08/03/2018 0937   MCH 32.2 08/03/2018 0937   MCH 30.1 06/17/2014 0918   MCHC 33.8 11/10/2018 1638   RDW 13.0 11/10/2018 1638   RDW 12.6 08/03/2018 0937   LYMPHSABS 2.8 11/10/2018  1638   LYMPHSABS 2.2 08/03/2018 0937   MONOABS 0.7 11/10/2018 1638   EOSABS 0.1 11/10/2018 1638   EOSABS 0.2 08/03/2018 0937   BASOSABS 0.1 11/10/2018 1638   BASOSABS 0.0 08/03/2018 0937    CMP     Component Value Date/Time   NA 135 11/10/2018 1638   NA 136 08/03/2018 0937   K 4.9 11/10/2018 1638   CL 100 11/10/2018 1638   CO2 27 11/10/2018 1638   GLUCOSE 88 11/10/2018 1638   BUN 15 11/10/2018 1638   BUN 11 08/03/2018 0937   CREATININE 1.05 11/10/2018 1638   CREATININE 0.92 01/03/2013 0832   CALCIUM 9.7 11/10/2018 1638   PROT 7.4 11/10/2018 1638   PROT 6.9 08/03/2018 0937   ALBUMIN 4.4 11/10/2018 1638   ALBUMIN 4.3 08/03/2018 0937   AST 21 11/10/2018 1638   ALT 24 11/10/2018 1638   ALKPHOS 79 11/10/2018 1638   BILITOT 0.3 11/10/2018 1638   BILITOT 0.4 08/03/2018 0937   GFRNONAA 76 08/03/2018 0937   GFRNONAA >89 01/03/2013 0832   GFRAA 88 08/03/2018 0937   GFRAA >89 01/03/2013 0832    Assessment: 1.  Abdominal pain: In multiple spots of the abdomen, unchanged over the past couple of months even after 3 weeks of antibiotics for suspected diverticulitis, recent CT with no concerning findings; consider IBS +/- diverticular disease versus other 2.  History of multiple tubular adenomas: Repeat colonoscopy is due in December of this year for surveillance  Plan: 1.  Went ahead and scheduled the patient for his surveillance colonoscopy.  Did discuss risks, benefits, limitations and alternatives and the patient agrees to proceed. 2.  Prescribed Dicyclomine 10 mg to be taken 20 to 30 minutes for breakfast and dinner #30 with 1 refill 3.  Repeat CBC and CMP today 4.  Patient to follow in clinic per recommendations from Dr. Hilarie Fredrickson after time of procedures.  Ellouise Newer, PA-C [facility] 12/10/2018, 1:18 PM  Cc: No ref. provider found

## 2018-12-10 NOTE — Addendum Note (Signed)
Addended by: Martin[place], Arlie Riker E on: 12/10/2018 02:50 PM   Modules accepted: Orders

## 2018-12-10 NOTE — Patient Instructions (Signed)
You have been scheduled for a colonoscopy. Please follow written instructions given to you at your visit today.  Please pick up your prep supplies at the pharmacy within the next 1-3 days. If you use inhalers (even only as needed), please bring them with you on the day of your procedure.   Your provider has requested that you go to the basement level for lab work before leaving today. Press "B" on the elevator. The lab is located at the first door on the left as you exit the elevator.   We have sent the following medications to your pharmacy for you to pick up at your convenience: Dicyclomine and suprep   I appreciate the opportunity to care for you. Anderson Malta

## 2018-12-14 ENCOUNTER — Encounter: Payer: Self-pay | Admitting: Internal Medicine

## 2018-12-15 ENCOUNTER — Telehealth: Payer: Self-pay

## 2018-12-15 NOTE — Telephone Encounter (Signed)
Covid-19 screening questions   Do you now or have you had a fever in the last 14 days?  Do you have any respiratory symptoms of shortness of breath or cough now or in the last 14 days?  Do you have any family members or close contacts with diagnosed or suspected Covid-19 in the past 14 days?  Have you been tested for Covid-19 and found to be positive?       

## 2018-12-16 ENCOUNTER — Ambulatory Visit (AMBULATORY_SURGERY_CENTER): Payer: BC Managed Care – PPO | Admitting: Internal Medicine

## 2018-12-16 ENCOUNTER — Encounter: Payer: Self-pay | Admitting: Internal Medicine

## 2018-12-16 ENCOUNTER — Other Ambulatory Visit: Payer: Self-pay

## 2018-12-16 ENCOUNTER — Other Ambulatory Visit: Payer: Self-pay | Admitting: Internal Medicine

## 2018-12-16 VITALS — BP 145/92 | HR 68 | Temp 98.3°F | Resp 16 | Ht 68.0 in | Wt 190.0 lb

## 2018-12-16 DIAGNOSIS — Z86010 Personal history of colonic polyps: Secondary | ICD-10-CM | POA: Diagnosis not present

## 2018-12-16 DIAGNOSIS — D128 Benign neoplasm of rectum: Secondary | ICD-10-CM

## 2018-12-16 DIAGNOSIS — D125 Benign neoplasm of sigmoid colon: Secondary | ICD-10-CM

## 2018-12-16 DIAGNOSIS — D120 Benign neoplasm of cecum: Secondary | ICD-10-CM

## 2018-12-16 DIAGNOSIS — D123 Benign neoplasm of transverse colon: Secondary | ICD-10-CM

## 2018-12-16 MED ORDER — SODIUM CHLORIDE 0.9 % IV SOLN: 500.0000 mL | INTRAVENOUS | Status: DC

## 2018-12-16 NOTE — Progress Notes (Signed)
Addendum: Reviewed and agree with assessment and management plan. Layani Foronda M, MD  

## 2018-12-16 NOTE — Patient Instructions (Signed)
Please read handouts provided. Await pathology results. Continue present medications.      YOU HAD AN ENDOSCOPIC PROCEDURE TODAY AT THE  ENDOSCOPY CENTER:   Refer to the procedure report that was given to you for any specific questions about what was found during the examination.  If the procedure report does not answer your questions, please call your gastroenterologist to clarify.  If you requested that your care partner not be given the details of your procedure findings, then the procedure report has been included in a sealed envelope for you to review at your convenience later.  YOU SHOULD EXPECT: Some feelings of bloating in the abdomen. Passage of more gas than usual.  Walking can help get rid of the air that was put into your GI tract during the procedure and reduce the bloating. If you had a lower endoscopy (such as a colonoscopy or flexible sigmoidoscopy) you may notice spotting of blood in your stool or on the toilet paper. If you underwent a bowel prep for your procedure, you may not have a normal bowel movement for a few days.  Please Note:  You might notice some irritation and congestion in your nose or some drainage.  This is from the oxygen used during your procedure.  There is no need for concern and it should clear up in a day or so.  SYMPTOMS TO REPORT IMMEDIATELY:   Following lower endoscopy (colonoscopy or flexible sigmoidoscopy):  Excessive amounts of blood in the stool  Significant tenderness or worsening of abdominal pains  Swelling of the abdomen that is new, acute  Fever of 100F or higher    For urgent or emergent issues, a gastroenterologist can be reached at any hour by calling [phone].   DIET:  We do recommend a small meal at first, but then you may proceed to your regular diet.  Drink plenty of fluids but you should avoid alcoholic beverages for 24 hours.  ACTIVITY:  You should plan to take it easy for the rest of today and you should NOT  DRIVE or use heavy machinery until tomorrow (because of the sedation medicines used during the test).    FOLLOW UP: Our staff will call the number listed on your records 48-72 hours following your procedure to check on you and address any questions or concerns that you may have regarding the information given to you following your procedure. If we do not reach you, we will leave a message.  We will attempt to reach you two times.  During this call, we will ask if you have developed any symptoms of COVID 19. If you develop any symptoms (ie: fever, flu-like symptoms, shortness of breath, cough etc.) before then, please call [phone].  If you test positive for Covid 19 in the 2 weeks post procedure, please call and report this information to us.    If any biopsies were taken you will be contacted by phone or by letter within the next 1-3 weeks.  Please call us at [phone] if you have not heard about the biopsies in 3 weeks.    SIGNATURES/CONFIDENTIALITY: You and/or your care partner have signed paperwork which will be entered into your electronic medical record.  These signatures attest to the fact that that the information above on your After Visit Summary has been reviewed and is understood.  Full responsibility of the confidentiality of this discharge information lies with you and/or your care-partner. 

## 2018-12-16 NOTE — Op Note (Signed)
[place] Patient Name: Carl Mccullough Procedure Date: 12/16/2018 4:23 PM MRN: [id number] Endoscopist: Jerene Bears , MD Age: 63 Referring MD:  Date of Birth: 03/27/1955 Gender: Male Account #: [id number] Procedure:                Colonoscopy Indications:              High risk colon cancer surveillance: Personal                            history of multiple (3 or more) adenomas, Personal                            history of sessile serrated colon polyp (less than                            10 mm in size) with no dysplasia, Last colonoscopy:                            December 2019; also recent left middle abdominal                            pain treated for diverticulitis, bilateral lower                            abdominal pain Medicines:                Monitored Anesthesia Care Procedure:                Pre-Anesthesia Assessment:                           - Prior to the procedure, a History and Physical                            was performed, and patient medications and                            allergies were reviewed. The patient's tolerance of                            previous anesthesia was also reviewed. The risks                            and benefits of the procedure and the sedation                            options and risks were discussed with the patient.                            All questions were answered, and informed consent                            was obtained. Prior Anticoagulants: The patient has  taken no previous anticoagulant or antiplatelet                            agents. ASA Grade Assessment: II - A patient with                            mild systemic disease. After reviewing the risks                            and benefits, the patient was deemed in                            satisfactory condition to undergo the procedure.                           After obtaining informed consent, the colonoscope                            was passed under direct vision. Throughout the                            procedure, the patient's blood pressure, pulse, and                            oxygen saturations were monitored continuously. The                            Colonoscope was introduced through the anus and                            advanced to the terminal ileum. The colonoscopy was                            performed without difficulty. The patient tolerated                            the procedure well. The quality of the bowel                            preparation was good. The terminal ileum, ileocecal                            valve, appendiceal orifice, and rectum were                            photographed. Scope In: 4:35:07 PM Scope Out: 4:54:18 PM Scope Withdrawal Time: 0 hours 16 minutes 9 seconds  Total Procedure Duration: 0 hours 19 minutes 11 seconds  Findings:                 The digital rectal exam was normal.                           The terminal ileum appeared normal.  A 5 mm polyp was found in the cecum. The polyp was                            sessile. The polyp was removed with a cold snare.                            Resection and retrieval were complete.                           Two sessile polyps were found in the transverse                            colon. The polyps were 4 to 5 mm in size. These                            polyps were removed with a cold snare. Resection                            and retrieval were complete.                           A 5 mm polyp was found in the sigmoid colon. The                            polyp was sessile. The polyp was removed with a                            cold snare. Resection and retrieval were complete.                           A 3 mm polyp was found in the rectum. The polyp was                            sessile. The polyp was removed with a cold snare.                             Resection and retrieval were complete.                           Multiple small and large-mouthed diverticula were                            found in the sigmoid colon, descending colon,                            transverse colon and ascending colon. At one                            sigmoid diverticulum there was erythema and mild                            inflammation.  Internal hemorrhoids were found during                            retroflexion. The hemorrhoids were small. Complications:            No immediate complications. Estimated Blood Loss:     Estimated blood loss was minimal. Impression:               - The examined portion of the ileum was normal.                           - One 5 mm polyp in the cecum, removed with a cold                            snare. Resected and retrieved.                           - Two 4 to 5 mm polyps in the transverse colon,                            removed with a cold snare. Resected and retrieved.                           - One 5 mm polyp in the sigmoid colon, removed with                            a cold snare. Resected and retrieved.                           - One 3 mm polyp in the rectum, removed with a cold                            snare. Resected and retrieved.                           - Diverticulosis in the sigmoid colon, in the                            descending colon, in the transverse colon and in                            the ascending colon. Peri-diverticular erythema was                            seen at one diverticulum in the sigmoid (likely                            area of recent diverticulitis).                           - Small internal hemorrhoids. Recommendation:           - Patient has a contact number available for  emergencies. The signs and symptoms of potential                            delayed complications were discussed with the                             patient. Return to normal activities tomorrow.                            Written discharge instructions were provided to the                            patient.                           - Resume previous diet.                           - Continue present medications.                           - Await pathology results.                           - Repeat colonoscopy is recommended for                            surveillance. The colonoscopy date will be                            determined after pathology results from today's                            exam become available for review.                           - Would hold on additional antibiotics at this                            time. If worsening left middle to lower abdominal                            pain please notify my office.                           - Referral to [facility] for                            evaluation of bilateral inguinal hernias with                            bilateral lower abdominal pain. Jerene Bears, MD 12/16/2018 5:02:56 PM This report has been signed electronically.

## 2018-12-16 NOTE — Progress Notes (Signed)
Called to room to assist during endoscopic procedure.  Patient ID and intended procedure confirmed with present staff. Received instructions for my participation in the procedure from the performing physician.  

## 2018-12-20 NOTE — Telephone Encounter (Signed)
  Follow up Call-  Call back number 12/16/2018 01/28/2018  Post procedure Call Back phone  # [phone] - WIFE  Permission to leave phone message Yes Yes  Some recent data might be hidden     Patient questions:  Do you have a fever, pain , or abdominal swelling? No. Pain Score  0 *  Have you tolerated food without any problems? Yes.    Have you been able to return to your normal activities? Yes.    Do you have any questions about your discharge instructions: Diet   No. Medications  No. Follow up visit  No.  Do you have questions or concerns about your Care? No.  Actions: * If pain score is 4 or above: No action needed, pain <4.

## 2018-12-22 ENCOUNTER — Encounter: Payer: Self-pay | Admitting: Internal Medicine

## 2018-12-31 ENCOUNTER — Ambulatory Visit (INDEPENDENT_AMBULATORY_CARE_PROVIDER_SITE_OTHER): Payer: BC Managed Care – PPO | Admitting: Urology

## 2018-12-31 ENCOUNTER — Other Ambulatory Visit: Payer: Self-pay

## 2018-12-31 DIAGNOSIS — K4020 Bilateral inguinal hernia, without obstruction or gangrene, not specified as recurrent: Secondary | ICD-10-CM | POA: Diagnosis not present

## 2018-12-31 DIAGNOSIS — N5201 Erectile dysfunction due to arterial insufficiency: Secondary | ICD-10-CM

## 2018-12-31 DIAGNOSIS — R1030 Lower abdominal pain, unspecified: Secondary | ICD-10-CM | POA: Diagnosis not present

## 2018-12-31 DIAGNOSIS — N400 Enlarged prostate without lower urinary tract symptoms: Secondary | ICD-10-CM | POA: Diagnosis not present

## 2019-01-03 ENCOUNTER — Telehealth: Payer: Self-pay | Admitting: Internal Medicine

## 2019-01-03 NOTE — Telephone Encounter (Signed)
Called [facility] and they did not have record of referral. Referral faxed to [facility], pt aware and he knows to call back if he does not hear back regarding appt.

## 2019-01-05 ENCOUNTER — Telehealth: Payer: Self-pay | Admitting: Family Medicine

## 2019-01-05 NOTE — Telephone Encounter (Signed)
Called patient and set him up to see Dr. Lajuana Ripple.

## 2019-01-17 ENCOUNTER — Ambulatory Visit: Payer: Self-pay | Admitting: Surgery

## 2019-01-17 NOTE — H&P (Signed)
Irish Lack Documented: 01/17/2019 4:04 PM Location: [facility] Patient #: [id number] DOB: [birth year] Married / Language: Cleophus Molt / Race: White Male  History of Present Illness Adin Hector MD; 01/17/2019 4:45 PM) The patient is a 63 year old male who presents with an inguinal hernia. Note for "Inguinal hernia": ` ` ` Patient sent for surgical consultation at the request of Dr Hilarie Fredrickson  Chief Complaint: Left lower quadrant and groin pain. Hernias. ` ` The patient is a pleasant active male. History of diverticulitis and diverticulosis. Had an episode of lower abdominal and groin pain that concerned him. Underwent CAT scan that showed diverticulosis. No definite diverticulitis. Fat containing inguinal hernias. He was sent to see Dr. Gaspar Bidding [facility]. I do not have those records yet, but the patient recalls that there were no major concerns. Patient does not have severe nocturia or dysuria. Underwent colonoscopy that was showing no stricture or inflammation. Benign hyperplastic and adenomatous polyps removed. Surgical consultation recommended. Patient usually moves his bowels once a day. He can walk at least half hour without difficulty. 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 No smoking. No diabetes. No history of MRSA skin infections. No history of urinary tract infections.  Patient comes a with his wife.  (Review of systems as stated in this history (HPI) or in the review of systems. Otherwise all other 12 point ROS are negative) ` ` `   Past Surgical History Emeline Gins, [place]; 01/17/2019 4:04 PM) Colon Polyp Removal - Open  Diagnostic Studies History Emeline Gins, [place]; 01/17/2019 4:04 PM) Colonoscopy within last year 1-5 years ago  Allergies Emeline Gins, CMA; 01/17/2019 4:05 PM) No Known Drug Allergies [12/14/2014]: Allergies Reconciled  Medication History Emeline Gins, [place]; 01/17/2019  4:06 PM) Vitamin D (Ergocalciferol) (50000UNIT Capsule, Oral) Active. Fluzone High-Dose Quadrivalent (0.7ML Susp Pref Syr, Intramuscular) Active. Fluticasone Propionate (50MCG/ACT Suspension, Nasal) Active. Cetirizine HCl (10MG  Tablet, Oral) Active. Sildenafil Citrate (20MG  Tablet, Oral) Active. Medications Reconciled  Social History Emeline Gins, [place]; 01/17/2019 4:04 PM) Alcohol use Occasional alcohol use. No drug use Tobacco use Never smoker.  Family History Emeline Gins, [place]; 01/17/2019 4:04 PM) Cancer Father. Colon Cancer Father. Hypertension Brother, Mother. Thyroid problems Mother.     Review of Systems Emeline Gins CMA; 01/17/2019 4:04 PM) General Not Present- Appetite Loss, Chills, Fatigue, Fever, Night Sweats, Weight Gain and Weight Loss. Skin Not Present- Change in Wart/Mole, Dryness, Hives, Jaundice, New Lesions, Non-Healing Wounds, Rash and Ulcer. HEENT Present- Hearing Loss, Ringing in the Ears and Wears glasses/contact lenses. Not Present- Earache, Hoarseness, Nose Bleed, Oral Ulcers, Seasonal Allergies, Sinus Pain, Sore Throat, Visual Disturbances and Yellow Eyes. Respiratory Not Present- Bloody sputum, Chronic Cough, Difficulty Breathing, Snoring and Wheezing. Breast Not Present- Breast Mass, Breast Pain, Nipple Discharge and Skin Changes. Cardiovascular Not Present- Chest Pain, Difficulty Breathing Lying Down, Leg Cramps, Palpitations, Rapid Heart Rate, Shortness of Breath and Swelling of Extremities. Gastrointestinal Not Present- Abdominal Pain, Bloating, Bloody Stool, Change in Bowel Habits, Chronic diarrhea, Constipation, Difficulty Swallowing, Excessive gas, Gets full quickly at meals, Hemorrhoids, Indigestion, Nausea, Rectal Pain and Vomiting. Male Genitourinary Not Present- Blood in Urine, Change in Urinary Stream, Frequency, Impotence, Nocturia, Painful Urination, Urgency and Urine Leakage.  Vitals Emeline Gins CMA; 01/17/2019 4:05  PM) 01/17/2019 4:04 PM Weight: 187.8 lb Height: 70in Body Surface Area: 2.03 m Body Mass Index: 26.95 kg/m  Temp.: 98.27F  Pulse: 92 (Regular)  BP: 138/94 (Sitting, Left Arm, Standard)  Physical Exam Adin Hector MD; 01/17/2019 4:43 PM)  General Mental Status-Alert. General Appearance-Not in acute distress, Not Sickly. Orientation-Oriented X3. Hydration-Well hydrated. Voice-Normal.  Integumentary Global Assessment Upon inspection and palpation of skin surfaces of the - Axillae: non-tender, no inflammation or ulceration, no drainage. and Distribution of scalp and body hair is normal. General Characteristics Temperature - normal warmth is noted.  Head and Neck Head-normocephalic, atraumatic with no lesions or palpable masses. Face Global Assessment - atraumatic, no absence of expression. Neck Global Assessment - no abnormal movements, no bruit auscultated on the right, no bruit auscultated on the left, no decreased range of motion, non-tender. Trachea-midline. Thyroid Gland Characteristics - non-tender.  Eye Eyeball - Left-Extraocular movements intact, No Nystagmus - Left. Eyeball - Right-Extraocular movements intact, No Nystagmus - Right. Cornea - Left-No Hazy - Left. Cornea - Right-No Hazy - Right. Sclera/Conjunctiva - Left-No scleral icterus, No Discharge - Left. Sclera/Conjunctiva - Right-No scleral icterus, No Discharge - Right. Pupil - Left-Direct reaction to light normal. Pupil - Right-Direct reaction to light normal.  ENMT Ears Pinna - Left - no drainage observed, no generalized tenderness observed. Pinna - Right - no drainage observed, no generalized tenderness observed. Nose and Sinuses External Inspection of the Nose - no destructive lesion observed. Inspection of the nares - Left - quiet respiration. Inspection of the nares - Right - quiet respiration. Mouth and Throat Lips - Upper Lip - no fissures  observed, no pallor noted. Lower Lip - no fissures observed, no pallor noted. Nasopharynx - no discharge present. Oral Cavity/Oropharynx - Tongue - no dryness observed. Oral Mucosa - no cyanosis observed. Hypopharynx - no evidence of airway distress observed.  Chest and Lung Exam Inspection Movements - Normal and Symmetrical. Accessory muscles - No use of accessory muscles in breathing. Palpation Palpation of the chest reveals - Non-tender. Auscultation Breath sounds - Normal and Clear.  Cardiovascular Auscultation Rhythm - Regular. Murmurs & Other Heart Sounds - Auscultation of the heart reveals - No Murmurs and No Systolic Clicks.  Abdomen Inspection Inspection of the abdomen reveals - No Visible peristalsis and No Abnormal pulsations. Umbilicus - No Bleeding, No Urine drainage. Palpation/Percussion Palpation and Percussion of the abdomen reveal - Soft, Non Tender, No Rebound tenderness, No Rigidity (guarding) and No Cutaneous hyperesthesia. Note: Abdomen soft. Nontender. Mild diastases recti. Small umbilical hernia at the base of the stalk. Present on Valsalva only. Not distended. No guarding.  Male Genitourinary Sexual Maturity Tanner 5 - Adult hair pattern and Adult penile size and shape. Note: Left greater than right inguinal hernias. Left one more sensitive going down to upper scrotum. Both reducible. Otherwise normal external male genitalia.  Peripheral Vascular Upper Extremity Inspection - Left - No Cyanotic nailbeds - Left, Not Ischemic. Inspection - Right - No Cyanotic nailbeds - Right, Not Ischemic.  Neurologic Neurologic evaluation reveals -normal attention span and ability to concentrate, able to name objects and repeat phrases. Appropriate fund of knowledge , normal sensation and normal coordination. Mental Status Affect - not angry, not paranoid. Cranial Nerves-Normal Bilaterally. Gait-Normal.  Neuropsychiatric Mental status exam performed with  findings of-able to articulate well with normal speech/language, rate, volume and coherence, thought content normal with ability to perform basic computations and apply abstract reasoning and no evidence of hallucinations, delusions, obsessions or homicidal/suicidal ideation.  Musculoskeletal Global Assessment Spine, Ribs and Pelvis - no instability, subluxation or laxity. Right Upper Extremity - no instability, subluxation or laxity.  Lymphatic Head & Neck  General Head & Neck  Lymphatics: Bilateral - Description - No Localized lymphadenopathy. Axillary  General Axillary Region: Bilateral - Description - No Localized lymphadenopathy. Femoral & Inguinal  Generalized Femoral & Inguinal Lymphatics: Left - Description - No Localized lymphadenopathy. Right - Description - No Localized lymphadenopathy.    Assessment & Plan Adin Hector MD; 01/17/2019 4:41 PM)  BILATERAL INGUINAL HERNIA WITHOUT OBSTRUCTION OR GANGRENE, RECURRENCE NOT SPECIFIED (K40.20) Impression: BIH, left > right  I recommended repair. lap approach. He is not in severe pain, so this is not emergency. There focusing on caring for his mother-in-law, so most likely we'll wait until the new year. He is interested in proceeding at some point. Small umbilical hernia can be repaired primarily at the same time.  Current Plans The anatomy & physiology of the abdominal wall and pelvic floor was discussed. The pathophysiology of hernias in the inguinal and pelvic region was discussed. Natural history risks such as progressive enlargement, pain, incarceration, and strangulation was discussed. Contributors to complications such as smoking, obesity, diabetes, prior surgery, etc were discussed.  I feel the risks of no intervention will lead to serious problems that outweigh the operative risks; therefore, I recommended surgery to reduce and repair the hernia. I explained laparoscopic techniques with possible need for an open  approach. I noted usual use of mesh to patch and/or buttress hernia repair  Risks such as bleeding, infection, abscess, need for further treatment, heart attack, death, and other risks were discussed. I noted a good likelihood this will help address the problem. Goals of post-operative recovery were discussed as well. Possibility that this will not correct all symptoms was explained. I stressed the importance of low-impact activity, aggressive pain control, avoiding constipation, & not pushing through pain to minimize risk of post-operative chronic pain or injury. Possibility of reherniation was discussed. We will work to minimize complications.  An educational handout further explaining the pathology & treatment options was given as well. Questions were answered. The patient expresses understanding & wishes to proceed with surgery.   UMBILICAL HERNIA WITHOUT OBSTRUCTION AND WITHOUT GANGRENE (K42.9) Impression: Umbilical hernia is found on Valsalva. About 1 cm in size. It should be able to control with suture repair only  Current Plans The anatomy & physiology of the abdominal wall was discussed. The pathophysiology of hernias was discussed. Natural history risks without surgery including progeressive enlargement, pain, incarceration, & strangulation was discussed. Contributors to complications such as smoking, obesity, diabetes, prior surgery, etc were discussed.  I feel the risks of no intervention will lead to serious problems that outweigh the operative risks; therefore, I recommended surgery to reduce and repair the hernia. I explained laparoscopic techniques with possible need for an open approach. I noted the probable use of mesh to patch and/or buttress the hernia repair  Risks such as bleeding, infection, abscess, need for further treatment, heart attack, death, and other risks were discussed. I noted a good likelihood this will help address the problem. Goals of  post-operative recovery were discussed as well. Possibility that this will not correct all symptoms was explained. I stressed the importance of low-impact activity, aggressive pain control, avoiding constipation, & not pushing through pain to minimize risk of post-operative chronic pain or injury. Possibility of reherniation especially with smoking, obesity, diabetes, immunosuppression, and other health conditions was discussed. We will work to minimize complications.  An educational handout further explaining the pathology & treatment options was given as well. Questions were answered. The patient expresses understanding & wishes to proceed with surgery.  PREOP - ING HERNIA - ENCOUNTER FOR PREOPERATIVE EXAMINATION FOR GENERAL SURGICAL PROCEDURE (Z01.818)  Current Plans You are being scheduled for surgery- Our schedulers will call you.  You should hear from our office's scheduling department within 5 working days about the location, date, and time of surgery. We try to make accommodations for patient's preferences in scheduling surgery, but sometimes the OR schedule or the surgeon's schedule prevents [place] from making those accommodations.  If you have not heard from our office [phone]) in 5 working days, call the office and ask for your surgeon's nurse.  If you have other questions about your diagnosis, plan, or surgery, call the office and ask for your surgeon's nurse.  Written instructions provided Pt Education - Pamphlet Given - Laparoscopic Hernia Repair: discussed with patient and provided information. Pt Education - [facility] Pain Control (Soley Harriss) Pt Education - [facility] Hernia Post-Op HCI (Shaima Sardinas): discussed with patient and provided information. Pt Education - [facility] Mesh education: discussed with patient and provided information.  Adin Hector, MD, FACS, MASCRS Gastrointestinal and Minimally Invasive Surgery  [facility] [address] [place], [place]  [id number] [phone] Main / Paging [phone] Fax

## 2019-01-18 ENCOUNTER — Other Ambulatory Visit: Payer: Self-pay

## 2019-01-19 ENCOUNTER — Ambulatory Visit: Payer: BC Managed Care – PPO | Admitting: Family Medicine

## 2019-01-19 VITALS — BP 138/88 | HR 95 | Temp 99.2°F | Ht 68.0 in | Wt 190.0 lb

## 2019-01-19 DIAGNOSIS — K4020 Bilateral inguinal hernia, without obstruction or gangrene, not specified as recurrent: Secondary | ICD-10-CM | POA: Diagnosis not present

## 2019-01-19 DIAGNOSIS — M25511 Pain in right shoulder: Secondary | ICD-10-CM | POA: Diagnosis not present

## 2019-01-19 MED ORDER — MELOXICAM 15 MG PO TABS: 7.5000 mg | ORAL_TABLET | Freq: Every day | ORAL | 0 refills | Status: DC | PRN

## 2019-01-19 NOTE — Progress Notes (Signed)
Subjective: CC: est care, right shoulder pain PCP: Janora Norlander, DO [id number] C Carl Mccullough is a 63 y.o. male presenting to clinic today for:  1.  Right shoulder pain Patient reports onset of right shoulder pain within the last 3 months.  He points to the posterior lateral aspect of the shoulder as the area of pain.  He does not find that it did necessarily exacerbated by any particular movements but does feel that it is worsened when he tries to lay on that side at nighttime.  He describes it as a generalized ache but nothing so severe that he is required medication or any treatments thus far.  He denies any associated numbness, tingling, weakness, joint swelling or discoloration.  He has had some mild right-sided neck tenderness but no overt pain.  He does work in a physical job position and frequently does overhead activities.   2.  Bilateral inguinal hernia,  umbilical hernia Patient was recently evaluated by Dr. Johney Maine for bilateral inguinal hernia and umbilical hernia.  There are plans for surgical intervention but this is being held until February.  At this time, he is doing okay from this standpoint.  He does not report any difficulty urinating, difficulty passing stool or any enlargement.  He is gone about his work activities normally.  ROS: Per HPI  No Known Allergies Past Medical History:  Diagnosis Date  . Benign prostatic hyperplasia   . Diverticulitis   . GERD (gastroesophageal reflux disease)   . Hyperlipidemia   . Hypertension   . Vitamin D deficiency     Current Outpatient Medications:  .  cetirizine (ZYRTEC) 10 MG tablet, Take 1 tablet (10 mg total) by mouth daily., Disp: 30 tablet, Rfl: 11 .  fluticasone (FLONASE) 50 MCG/ACT nasal spray, INSTILL 1-2 SPRAYS IN EACH NOSTRIL AT BEDTIME, Disp: 48 g, Rfl: 3 .  sildenafil (REVATIO) 20 MG tablet, Take 2-5 tabs PRN prior to sexual activity., Disp: 50 tablet, Rfl: 11 .  Vitamin D, Ergocalciferol, (DRISDOL) 1.25 MG (50000  UT) CAPS capsule, Take 1 capsule (50,000 Units total) by mouth every 7 (seven) days., Disp: 12 capsule, Rfl: 3  Current Facility-Administered Medications:  .  0.9 %  sodium chloride infusion, 500 mL, Intravenous, Continuous, Pyrtle, Lajuan Lines, MD Social History   Socioeconomic History  . Marital status: Married    Spouse name: Not on file  . Number of children: Not on file  . Years of education: Not on file  . Highest education level: Not on file  Occupational History  . Not on file  Social Needs  . Financial resource strain: Not on file  . Food insecurity    Worry: Not on file    Inability: Not on file  . Transportation needs    Medical: Not on file    Non-medical: Not on file  Tobacco Use  . Smoking status: Never Smoker  . Smokeless tobacco: Never Used  Substance and Sexual Activity  . Alcohol use: Yes  . Drug use: No  . Sexual activity: Yes  Lifestyle  . Physical activity    Days per week: Not on file    Minutes per session: Not on file  . Stress: Not on file  Relationships  . Social Herbalist on phone: Not on file    Gets together: Not on file    Attends religious service: Not on file    Active member of club or organization: Not on file    Attends  meetings of clubs or organizations: Not on file    Relationship status: Not on file  . Intimate partner violence    Fear of current or ex partner: Not on file    Emotionally abused: Not on file    Physically abused: Not on file    Forced sexual activity: Not on file  Other Topics Concern  . Not on file  Social History Narrative  . Not on file   Family History  Problem Relation Age of Onset  . Hypertension Mother   . Alzheimer's disease Mother   . Chronic Renal Failure Mother   . Colon cancer Father   . Bladder Cancer Father   . Hypertension Brother   . Rectal cancer Neg Hx   . Stomach cancer Neg Hx   . Esophageal cancer Neg Hx     Objective: Office vital signs reviewed. BP 138/88   Pulse 95    Temp 99.2 F (37.3 C) (Temporal)   Ht 5\' 8"  (1.727 m)   Wt 190 lb (86.2 kg)   SpO2 98%   BMI 28.89 kg/m   Physical Examination:  General: Awake, alert, well nourished, No acute distress HEENT: Normal, well appearing Cardio: regular rate and rhythm, S1S2 heard, no murmurs appreciated Extremities: warm, well perfused, No edema, cyanosis or clubbing; +2 pulses bilaterally MSK: normal gait and station   Right shoulder: No visible deformities, gross joint swelling or discoloration.  He has normal scapular glide bilaterally.  He has normal active range of motion bilaterally.  Negative painful arc sign. negative Hawkins.  Negative empty can.  He does have mild tenderness palpation just above the scapular spine.  Neck: Full active range of motion.  No palpable deformities.  No significant paraspinal muscle tenderness palpation.  No increased tonicity of the trapezius. Skin: dry; intact; no rashes or lesions Neuro: 5/5 UE Strength and light touch sensation grossly intact   Assessment/ Plan: 63 y.o. male   1. Acute pain of right shoulder I have considered bursitis versus impingement based on his description of pain.  He certainly does not demonstrate any evidence of rotator cuff tear during today's visit.  I am recommending trial of oral NSAIDs and home physical therapy.  These to have been provided to the patient.  Advised against use of other oral NSAIDs.  If no significant improvement in the next several weeks, may need to consider eval by orthopedics.  He is amenable to this plan. - meloxicam (MOBIC) 15 MG tablet; Take 0.5-1 tablets (7.5-15 mg total) by mouth daily as needed for pain.  Dispense: 30 tablet; Refill: 0  2. Non-recurrent bilateral inguinal hernia without obstruction or gangrene Stable.  Plans for surgical intervention at some point.  We will continue to monitor  No orders of the defined types were placed in this encounter.  No orders of the defined types were placed in this  encounter.    Janora Norlander, DO [place] [phone]

## 2019-01-19 NOTE — Patient Instructions (Addendum)
I have sent in meloxicam for you to take once daily with food.  You could start with half a tablet to see if this will work if it does not he can go up to the full tablet.  Follow the below instructions with icing.  Give you home physical therapy to do as well.  If symptoms worsen or do not improve, please let me know and we will place a referral to orthopedics as we discussed.  Bursitis  Bursitis is inflammation and irritation of a bursa, which is one of the small, fluid-filled sacs that cushion and protect the moving parts of your body. These sacs are located between bones and muscles, bones and muscle attachments, or bones and skin areas that are next to bones. A bursa protects those structures from the wear and tear that results from frequent movement. An inflamed bursa causes pain and swelling. Fluid may build up inside the sac. Bursitis is most common near joints, especially the knees, elbows, hips, and shoulders. What are the causes? This condition may be caused by:  Injury from: ? A direct hit (blow), like falling on your knee or elbow. ? Overuse of a joint (repetitive stress).  Infection. This can happen if bacteria get into a bursa through a cut or scrape near a joint.  Diseases that cause joint inflammation, such as gout and rheumatoid arthritis. What increases the risk? You are more likely to develop this condition if you:  Have a job or hobby that involves a lot of repetitive stress on your joints.  Have a condition that weakens your body's defense system (immune system), such as diabetes, cancer, or HIV.  Do any of these often: ? Lift and reach overhead. ? Kneel or lean on hard surfaces. ? Run or walk. What are the signs or symptoms? The most common symptoms of this condition include:  Pain that gets worse when you move the affected body part or use it to support (bear) your body weight.  Inflammation.  Stiffness. Other symptoms include:  Redness.  Swelling.   Tenderness.  Warmth.  Pain that continues after rest.  Fever or chills. These may occur in bursitis that is caused by infection. How is this diagnosed? This condition may be diagnosed based on:  Your medical history and a physical exam.  Imaging tests, such as an MRI.  A procedure to drain fluid from the bursa with a needle (aspiration). The fluid may be checked for signs of infection or gout.  Blood tests to rule out other causes of inflammation. How is this treated? This condition can usually be treated at home with rest, ice, applying pressure (compression), and raising the body part that is affected (elevation). This is called RICE therapy. For mild bursitis, RICE therapy may be all you need. Other treatments may include:  NSAIDs to treat pain and inflammation.  Corticosteroid medicines to fight inflammation. These medicines may be injected into and around the area of bursitis.  Aspiration of fluid from the bursa to relieve pain and improve movement.  Antibiotic medicine to treat an infected bursa.  A splint, brace, or walking aid, such as a cane.  Physical therapy if you continue to have pain or limited movement.  Surgery to remove a damaged or infected bursa. This may be needed if other treatments have not worked. Follow these instructions at home: Medicines  Take over-the-counter and prescription medicines only as told by your health care provider.  If you were prescribed an antibiotic medicine, take it  as told by your health care provider. Do not stop taking the antibiotic even if you start to feel better. General instructions   Rest the affected area as told by your health care provider. ? If possible, raise (elevate) the affected area above the level of your heart while you are sitting or lying down. ? Avoid activities that make pain worse.  Use splints, braces, pads, or walking aids as told by your health care provider.  If directed, put ice on the affected  area: ? If you have a removable splint or brace, remove it as told by your health care provider. ? Put ice in a plastic bag. ? Place a towel between your skin and the bag or between your splint or brace and the bag. ? Leave the ice on for 20 minutes, 2-3 times a day.  Keep all follow-up visits as told by your health care provider. This is important. Preventing future episodes Take actions to help prevent future episodes of bursitis.  Wear knee pads if you kneel often.  Wear sturdy running or walking shoes that fit you well.  Take breaks regularly from repetitive activity.  Warm up by stretching before doing any activity that takes a lot of effort.  Maintain a healthy weight or lose weight as recommended by your health care provider. If you need help doing this, ask your health care provider.  Exercise regularly. Start any new physical activity gradually. Contact a health care provider if you:  Have a fever.  Have chills.  Have bursitis that is not getting better with treatment or home care. Summary  Bursitis is inflammation and irritation of a bursa, which is one of the small, fluid-filled sacs that cushion and protect the moving parts of your body.  An inflamed bursa causes pain and swelling.  Bursitis is commonly diagnosed with a physical exam, but other tests are sometimes needed.  This condition can usually be treated at home with rest, ice, applying pressure (compression), and raising the body part that is affected (elevation). This is called RICE therapy. This information is not intended to replace advice given to you by your health care provider. Make sure you discuss any questions you have with your health care provider. Document Released: 02/01/2000 Document Revised: 01/16/2017 Document Reviewed: 12/19/2016 Elsevier Patient Education  2020 [employer].

## 2019-01-25 ENCOUNTER — Encounter: Payer: Self-pay | Admitting: *Deleted

## 2019-02-01 ENCOUNTER — Encounter: Payer: Self-pay | Admitting: Internal Medicine

## 2019-02-01 ENCOUNTER — Ambulatory Visit: Payer: BC Managed Care – PPO | Admitting: Internal Medicine

## 2019-02-01 VITALS — BP 142/90 | HR 80 | Temp 98.1°F | Ht 68.0 in | Wt 192.0 lb

## 2019-02-01 DIAGNOSIS — Z8719 Personal history of other diseases of the digestive system: Secondary | ICD-10-CM | POA: Diagnosis not present

## 2019-02-01 DIAGNOSIS — K429 Umbilical hernia without obstruction or gangrene: Secondary | ICD-10-CM | POA: Diagnosis not present

## 2019-02-01 DIAGNOSIS — K4020 Bilateral inguinal hernia, without obstruction or gangrene, not specified as recurrent: Secondary | ICD-10-CM

## 2019-02-01 DIAGNOSIS — Z86010 Personal history of colonic polyps: Secondary | ICD-10-CM

## 2019-02-01 NOTE — Progress Notes (Signed)
   Subjective:    Patient ID: Carl Mccullough, male    DOB: January 20, 1956, 64 y.o.   MRN: [id number]  HPI Carl Mccullough is a 63 year old male with a history of multiple colon polyps (adenomatous and sessile serrated), colonic diverticulosis with recent diverticulitis, family history of colon cancer in his father at age 10, history of bilateral inguinal and umbilical hernia who is here for follow-up.  He also has a history of hypertension, hyperlipidemia and BPH.  The patient was treated with prolonged antibiotics for diverticulitis in September and October of this year.  He then came for his surveillance colonoscopy which I performed on 12/16/2018.  This revealed 5 additional polyps ranging from 3 to 5 mm in size which were removed, diverticulosis from the ascending colon to sigmoid colon with one inflamed diverticulum seen in the sigmoid.  Small internal hemorrhoids were found.  He reports that he is feeling well at this time.  He has had no further issues with abdominal pain.  Appetite is good.  No upper GI or hepatobiliary complaint today.  No further issues with what he feels is diverticulitis.  He did see Dr. Johney Maine and is considering surgery for repair of bilateral inguinal hernia and umbilical hernia  Review of Systems As per HPI, otherwise negative  Current Medications, Allergies, Past Medical History, Past Surgical History, Family History and Social History were reviewed in [facility] record.     Objective:   Physical Exam BP (!) 142/90   Pulse 80   Temp 98.1 F (36.7 C)   Ht 5\' 8"  (1.727 m)   Wt 192 lb (87.1 kg)   BMI 29.19 kg/m  Gen: awake, alert, NAD HEENT: anicteric CV: RRR, no mrg Pulm: CTA b/l Abd: soft, NT/ND, +BS throughout Ext: no c/c/e Neuro: nonfocal     Assessment & Plan:  63 year old male with a history of multiple colon polyps (adenomatous and sessile serrated), colonic diverticulosis with recent diverticulitis, family history of colon  cancer in his father at age 71, history of bilateral inguinal and umbilical hernia who is here for follow-up.   1.  History of diverticulitis --he has colonic diverticulosis from the ascending colon to sigmoid colon.  Fortunately his diverticulitis has resolved though he did have a rather smoldering course in the early fall 2020.  He knows to let me know should he develop recurrent abdominal pain reminiscent of prior diverticulitis  2.  History of multiple adenomatous and sessile serrated colon polyps --greater than 10 lifetime precancerous polyps.  We discussed this today.  He does not have known Lynch syndrome though we did discuss possible genetic testing.  His father had colon cancer.  He has an adult son age 30.  I recommended that he consider genetic testing and he will consider.  If he is interested I am happy to make the referral.  I did recommend that his son proceed with early colorectal cancer screening based on family history.  My recommendation would be that his son have screening colonoscopy now given current age. --Change surveillance interval to 2 years from last colonoscopy --Consider medical genetics referral for multiple adenomatous and sessile serrated colon polyps  3.  Small umbilical and bilateral inguinal hernias --he will likely have these repaired with Dr. Johney Maine in early 2021  He can follow-up as needed  25 minutes spent with the patient today. Greater than 50% was spent in counseling and coordination of care with the patient

## 2019-02-01 NOTE — Patient Instructions (Signed)
You will be due for a recall colonoscopy in 11/2020. We will send you a reminder in the mail when it gets closer to that time.  If you are age 65 or older, your body mass index should be between 23-30. Your Body mass index is 29.19 kg/m. If this is out of the aforementioned range listed, please consider follow up with your Primary Care Provider.  If you are age 69 or younger, your body mass index should be between 19-25. Your Body mass index is 29.19 kg/m. If this is out of the aformentioned range listed, please consider follow up with your Primary Care Provider.

## 2019-03-16 ENCOUNTER — Other Ambulatory Visit: Payer: Self-pay | Admitting: Family Medicine

## 2019-03-16 DIAGNOSIS — M25511 Pain in right shoulder: Secondary | ICD-10-CM

## 2019-04-19 IMAGING — DX DG CHEST 2V
2 series · 2 of 2 positions shown · non-contrast
Comparison: May 12, 2017

CLINICAL DATA: Recent pneumonia

EXAM:
CHEST - 2 VIEW

[chest pa]
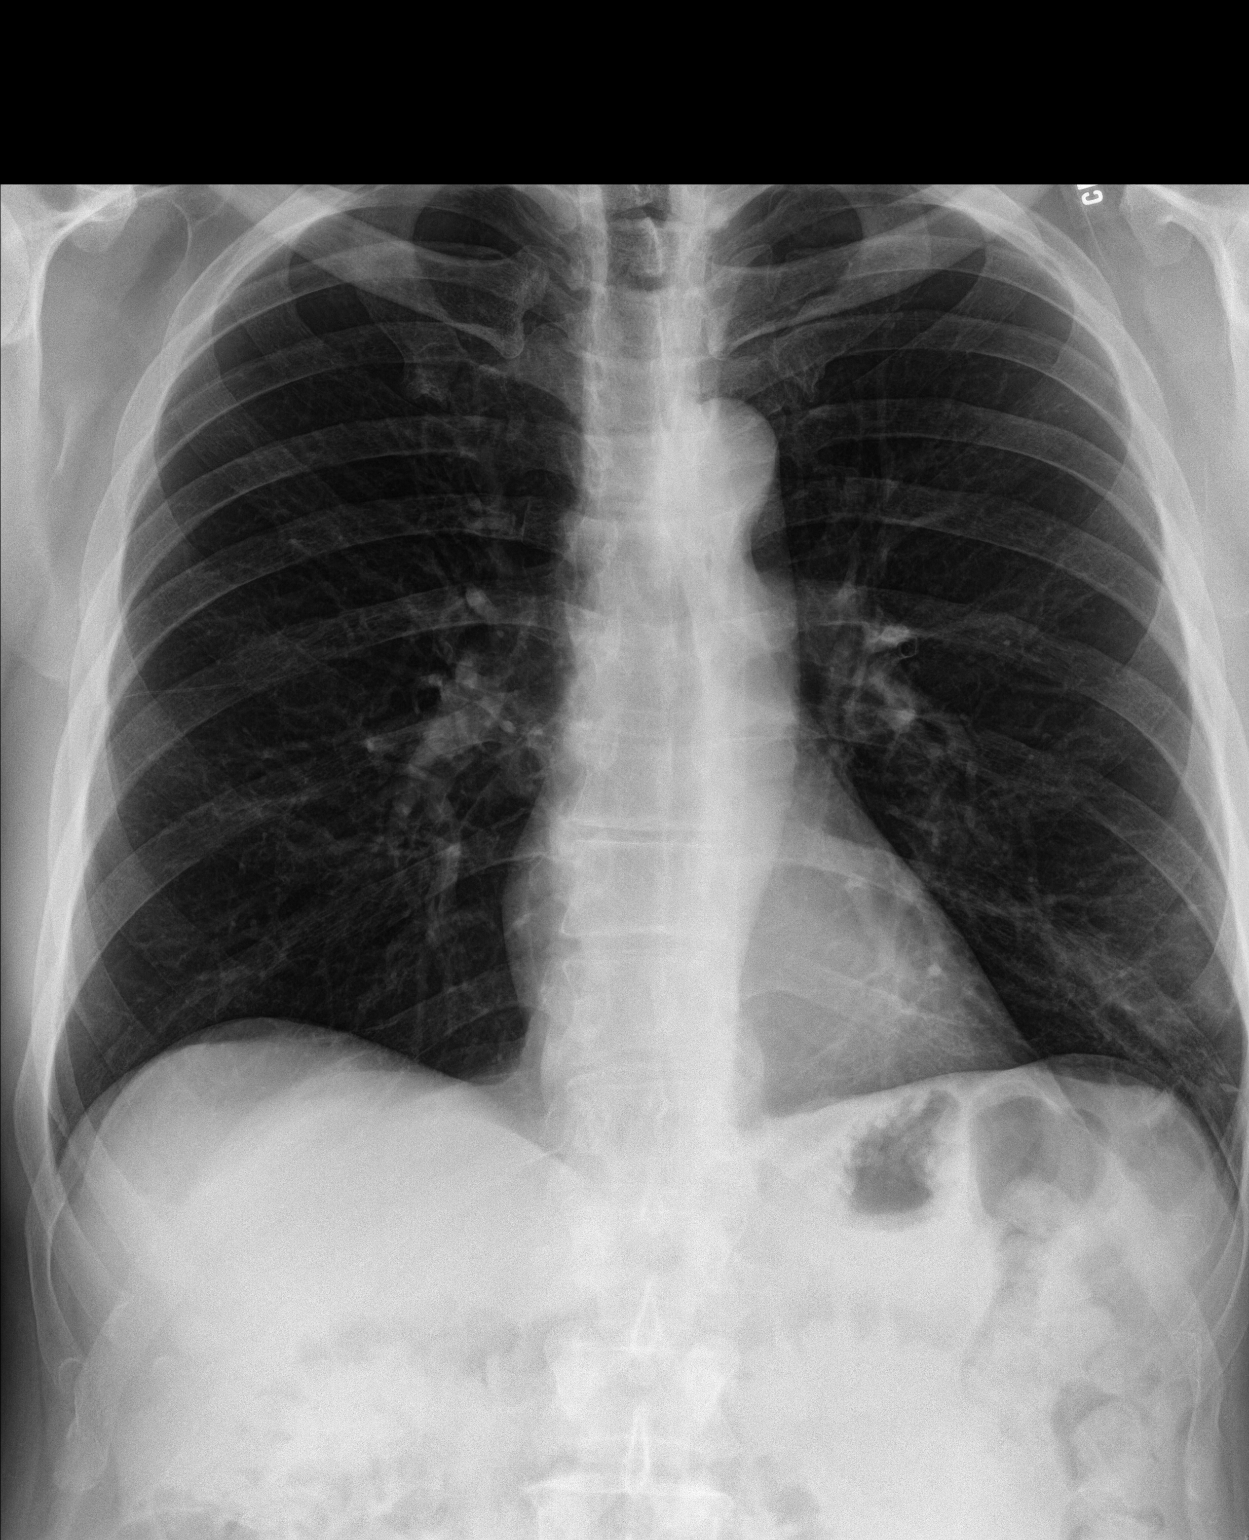

[chest lat]
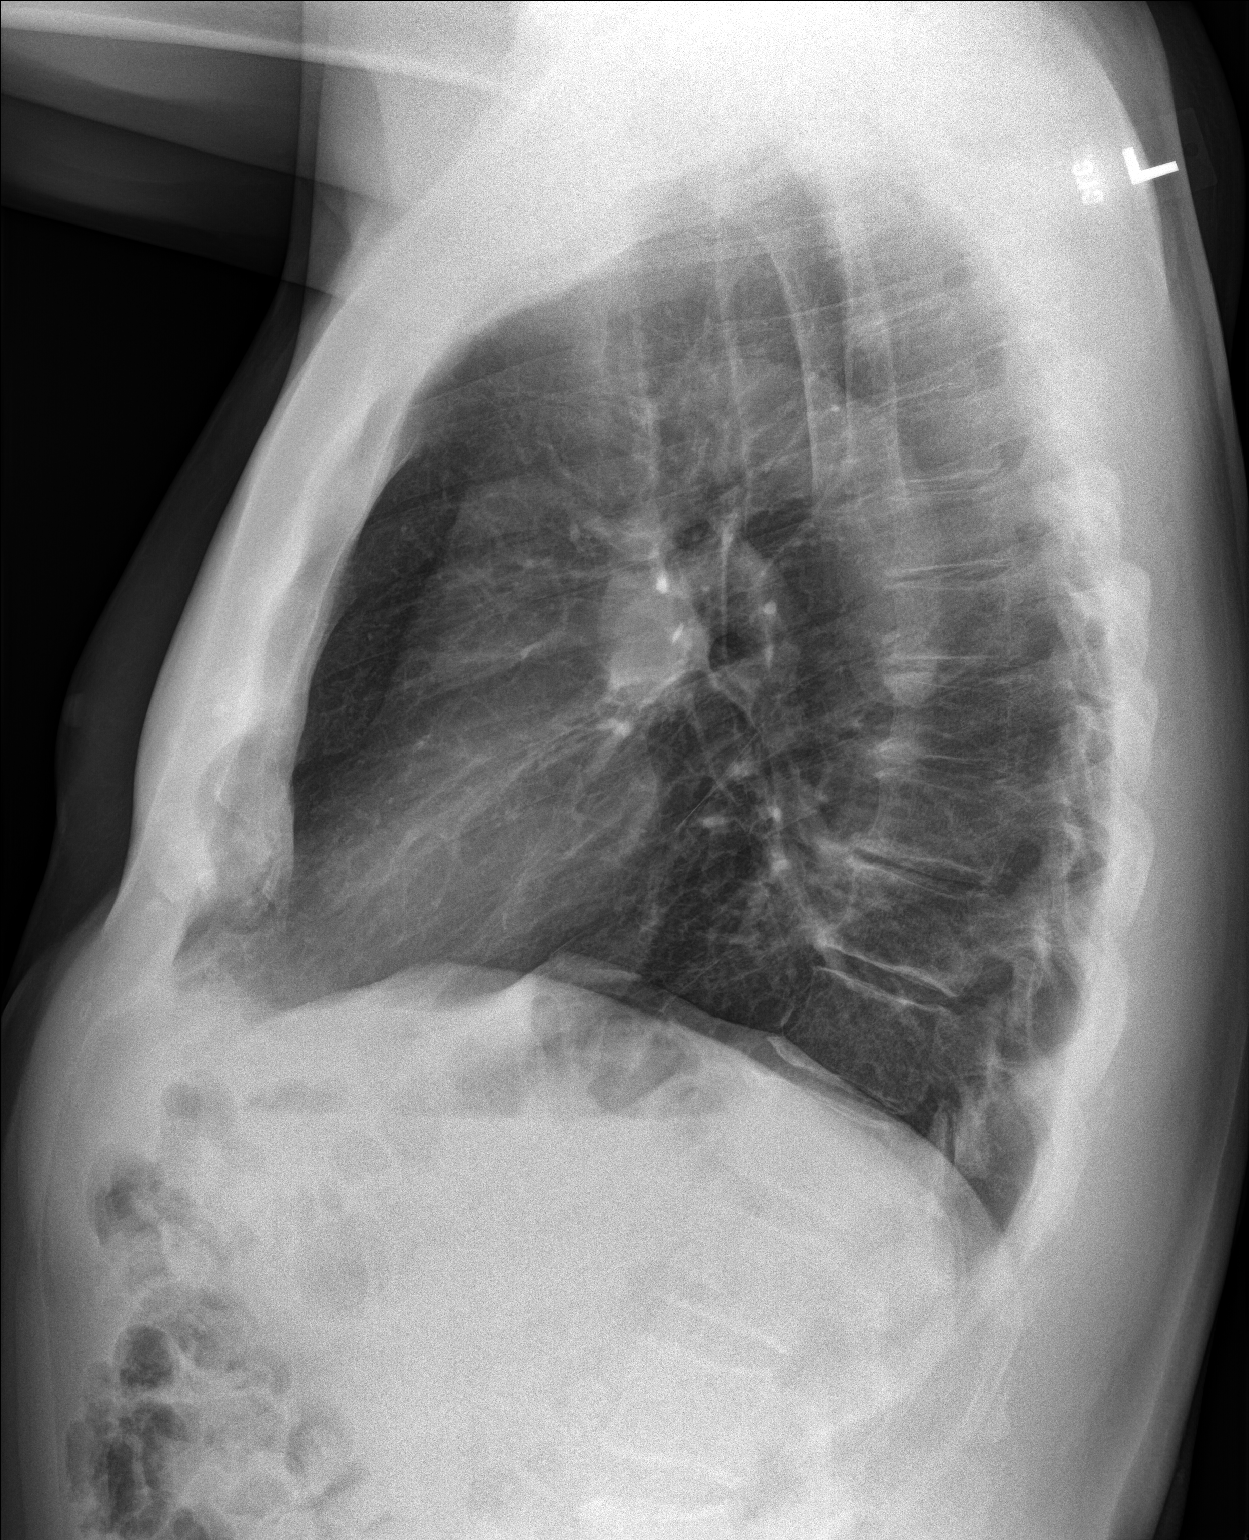

[2 of 2 positions shown; findings below may reference images not displayed]

FINDINGS: There has been significant clearing of infiltrate from the left
lower lobe. Patchy opacity remains in this area, likely residual
atelectasis. Lungs elsewhere are clear. Heart size and pulmonary
vascularity are normal. No adenopathy. There is mild degenerative
change in the thoracic spine.
IMPRESSION: Significant interval clearing of infiltrate from the left lower
lobe. Persistent mild atelectatic change remains in this area. There
may still be a slight amount of pneumonia present in this area.

Lungs elsewhere clear.  Heart size normal.  No evident adenopathy.

## 2019-05-26 ENCOUNTER — Ambulatory Visit: Payer: Self-pay | Admitting: Surgery

## 2019-05-26 ENCOUNTER — Other Ambulatory Visit: Payer: Self-pay

## 2019-05-26 ENCOUNTER — Encounter (HOSPITAL_BASED_OUTPATIENT_CLINIC_OR_DEPARTMENT_OTHER): Payer: Self-pay | Admitting: Surgery

## 2019-05-26 NOTE — H&P (View-Only) (Signed)
Irish Lack  Documented: 01/17/2019 4:04 PM  Location: [facility]  Patient #: [id number]  DOB: [birth year]  Married / Language: Cleophus Molt / Race: White  Male  History of Present Illness Adin Hector MD; 01/17/2019 4:45 PM)  The patient is a 64 year old male who presents with an inguinal hernia. Note for "Inguinal hernia": `  `  `  Patient sent for surgical consultation at the request of Dr Hilarie Fredrickson  Chief Complaint: Left lower quadrant and groin pain. Hernias.  `  `  The patient is a pleasant active male. History of diverticulitis and diverticulosis. Had an episode of lower abdominal and groin pain that concerned him. Underwent CAT scan that showed diverticulosis. No definite diverticulitis. Fat containing inguinal hernias. He was sent to see Dr. Gaspar Bidding [facility]. I do not have those records yet, but the patient recalls that there were no major concerns. Patient does not have severe nocturia or dysuria. Underwent colonoscopy that was showing no stricture or inflammation. Benign hyperplastic and adenomatous polyps removed. Surgical consultation recommended. Patient usually moves his bowels once a day. He can walk at least half hour without difficulty. 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 No smoking. No diabetes. No history of MRSA skin infections. No history of urinary tract infections.  Patient comes a with his wife.  (Review of systems as stated in this history (HPI) or in the review of systems. Otherwise all other 12 point ROS are negative)  `  `  `  Past Surgical History Emeline Gins, [place]; 01/17/2019 4:04 PM)  Colon Polyp Removal - Open  Diagnostic Studies History Emeline Gins, [place]; 01/17/2019 4:04 PM)  Colonoscopy within last year  1-5 years ago  Allergies Emeline Gins, CMA; 01/17/2019 4:05 PM)  No Known Drug Allergies [12/14/2014]:  Allergies Reconciled  Medication History Emeline Gins, [place]; 01/17/2019 4:06 PM)    Vitamin D (Ergocalciferol) (50000UNIT Capsule, Oral) Active.  Fluzone High-Dose Quadrivalent (0.7ML Susp Pref Syr, Intramuscular) Active.  Fluticasone Propionate (50MCG/ACT Suspension, Nasal) Active.  Cetirizine HCl (10MG  Tablet, Oral) Active.  Sildenafil Citrate (20MG  Tablet, Oral) Active.  Medications Reconciled  Social History Emeline Gins, [place]; 01/17/2019 4:04 PM)  Alcohol use Occasional alcohol use.  No drug use  Tobacco use Never smoker.  Family History Emeline Gins, [place]; 01/17/2019 4:04 PM)  Cancer Father.  Colon Cancer Father.  Hypertension Brother, Mother.  Thyroid problems Mother.  Review of Systems Emeline Gins CMA; 01/17/2019 4:04 PM)  General Not Present- Appetite Loss, Chills, Fatigue, Fever, Night Sweats, Weight Gain and Weight Loss.  Skin Not Present- Change in Wart/Mole, Dryness, Hives, Jaundice, New Lesions, Non-Healing Wounds, Rash and Ulcer.  HEENT Present- Hearing Loss, Ringing in the Ears and Wears glasses/contact lenses. Not Present- Earache, Hoarseness, Nose Bleed, Oral Ulcers, Seasonal Allergies, Sinus Pain, Sore Throat, Visual Disturbances and Yellow Eyes.  Respiratory Not Present- Bloody sputum, Chronic Cough, Difficulty Breathing, Snoring and Wheezing.  Breast Not Present- Breast Mass, Breast Pain, Nipple Discharge and Skin Changes.  Cardiovascular Not Present- Chest Pain, Difficulty Breathing Lying Down, Leg Cramps, Palpitations, Rapid Heart Rate, Shortness of Breath and Swelling of Extremities.  Gastrointestinal Not Present- Abdominal Pain, Bloating, Bloody Stool, Change in Bowel Habits, Chronic diarrhea, Constipation, Difficulty Swallowing, Excessive gas, Gets full quickly at meals, Hemorrhoids, Indigestion, Nausea, Rectal Pain and Vomiting.  Male Genitourinary Not Present- Blood in Urine, Change in Urinary Stream, Frequency, Impotence, Nocturia, Painful Urination, Urgency and Urine Leakage.  Vitals Emeline Gins CMA; 01/17/2019 4:05  PM)   01/17/2019 4:04 PM  Weight: 187.8 lb Height: 70 in  Body Surface Area: 2.03 m Body Mass Index: 26.95 kg/m  Temp.: 98.4 F Pulse: 92 (Regular)  BP: 138/94 (Sitting, Left Arm, Standard)  Physical Exam Adin Hector MD; 01/17/2019 4:43 PM)  General  Mental Status - Alert.  General Appearance - Not in acute distress, Not Sickly.  Orientation - Oriented X3.  Hydration - Well hydrated.  Voice - Normal.  Integumentary  Global Assessment  Upon inspection and palpation of skin surfaces of the - Axillae: non-tender, no inflammation or ulceration, no drainage. and Distribution of scalp and body hair is normal.  General Characteristics  Temperature - normal warmth is noted.  Head and Neck  Head - normocephalic, atraumatic with no lesions or palpable masses.  Face  Global Assessment - atraumatic, no absence of expression.  Neck  Global Assessment - no abnormal movements, no bruit auscultated on the right, no bruit auscultated on the left, no decreased range of motion, non-tender.  Trachea - midline.  Thyroid  Gland Characteristics - non-tender.  Eye  Eyeball - Left - Extraocular movements intact, No Nystagmus - Left.  Eyeball - Right - Extraocular movements intact, No Nystagmus - Right.  Cornea - Left - No Hazy - Left.  Cornea - Right - No Hazy - Right.  Sclera/Conjunctiva - Left - No scleral icterus, No Discharge - Left.  Sclera/Conjunctiva - Right - No scleral icterus, No Discharge - Right.  Pupil - Left - Direct reaction to light normal.  Pupil - Right - Direct reaction to light normal.  ENMT  Ears  Pinna - Left - no drainage observed, no generalized tenderness observed. Pinna - Right - no drainage observed, no generalized tenderness observed.  Nose and Sinuses  External Inspection of the Nose - no destructive lesion observed. Inspection of the nares - Left - quiet respiration. Inspection of the nares - Right - quiet respiration.  Mouth and Throat  Lips - Upper Lip - no fissures  observed, no pallor noted. Lower Lip - no fissures observed, no pallor noted. Nasopharynx - no discharge present. Oral Cavity/Oropharynx - Tongue - no dryness observed. Oral Mucosa - no cyanosis observed. Hypopharynx - no evidence of airway distress observed.  Chest and Lung Exam  Inspection  Movements - Normal and Symmetrical. Accessory muscles - No use of accessory muscles in breathing.  Palpation  Palpation of the chest reveals - Non-tender.  Auscultation  Breath sounds - Normal and Clear.  Cardiovascular  Auscultation  Rhythm - Regular. Murmurs & Other Heart Sounds - Auscultation of the heart reveals - No Murmurs and No Systolic Clicks.  Abdomen  Inspection  Inspection of the abdomen reveals - No Visible peristalsis and No Abnormal pulsations. Umbilicus - No Bleeding, No Urine drainage.  Palpation/Percussion  Palpation and Percussion of the abdomen reveal - Soft, Non Tender, No Rebound tenderness, No Rigidity (guarding) and No Cutaneous hyperesthesia.  Note: Abdomen soft. Nontender. Mild diastases recti. Small umbilical hernia at the base of the stalk. Present on Valsalva only. Not distended. No guarding.  Male Genitourinary  Sexual Maturity  Tanner 5 - Adult hair pattern and Adult penile size and shape.  Note: Left greater than right inguinal hernias. Left one more sensitive going down to upper scrotum. Both reducible. Otherwise normal external male genitalia.  Peripheral Vascular  Upper Extremity  Inspection - Left - No Cyanotic nailbeds - Left, Not Ischemic. Inspection - Right - No Cyanotic nailbeds - Right, Not  Ischemic.  Neurologic  Neurologic evaluation reveals - normal attention span and ability to concentrate, able to name objects and repeat phrases. Appropriate fund of knowledge , normal sensation and normal coordination.  Mental Status  Affect - not angry, not paranoid.  Cranial Nerves - Normal Bilaterally.  Gait - Normal.  Neuropsychiatric  Mental status exam performed  with findings of - able to articulate well with normal speech/language, rate, volume and coherence, thought content normal with ability to perform basic computations and apply abstract reasoning and no evidence of hallucinations, delusions, obsessions or homicidal/suicidal ideation.  Musculoskeletal  Global Assessment  Spine, Ribs and Pelvis - no instability, subluxation or laxity. Right Upper Extremity - no instability, subluxation or laxity.  Lymphatic  Head & Neck  General Head & Neck Lymphatics: Bilateral - Description - No Localized lymphadenopathy.  Axillary  General Axillary Region: Bilateral - Description - No Localized lymphadenopathy.  Femoral & Inguinal  Generalized Femoral & Inguinal Lymphatics: Left - Description - No Localized lymphadenopathy. Right - Description - No Localized lymphadenopathy.  Assessment & Plan Adin Hector MD; 01/17/2019 4:41 PM)  BILATERAL INGUINAL HERNIA WITHOUT OBSTRUCTION OR GANGRENE, RECURRENCE NOT SPECIFIED (K40.20)  Impression: BIH, left > right  I recommended repair. lap approach. He is not in severe pain, so this is not an emergency. There focusing on caring for his mother-in-law, so most likely we'll wait until the new year. He is interested in proceeding at some point. Small umbilical hernia can be repaired primarily at the same time.    Current Plans  The anatomy & physiology of the abdominal wall and pelvic floor was discussed. The pathophysiology of hernias in the inguinal and pelvic region was discussed. Natural history risks such as progressive enlargement, pain, incarceration, and strangulation was discussed. Contributors to complications such as smoking, obesity, diabetes, prior surgery, etc were discussed.  I feel the risks of no intervention will lead to serious problems that outweigh the operative risks; therefore, I recommended surgery to reduce and repair the hernia. I explained laparoscopic techniques with possible need for an open  approach. I noted usual use of mesh to patch and/or buttress hernia repair  Risks such as bleeding, infection, abscess, need for further treatment, heart attack, death, and other risks were discussed. I noted a good likelihood this will help address the problem. Goals of post-operative recovery were discussed as well. Possibility that this will not correct all symptoms was explained. I stressed the importance of low-impact activity, aggressive pain control, avoiding constipation, & not pushing through pain to minimize risk of post-operative chronic pain or injury. Possibility of reherniation was discussed. We will work to minimize complications.  An educational handout further explaining the pathology & treatment options was given as well. Questions were answered. The patient expresses understanding & wishes to proceed with surgery.  UMBILICAL HERNIA WITHOUT OBSTRUCTION AND WITHOUT GANGRENE (K42.9)  Impression: Umbilical hernia is found on Valsalva. About 1 cm in size. It should be able to control with suture repair only  Current Plans  The anatomy & physiology of the abdominal wall was discussed. The pathophysiology of hernias was discussed. Natural history risks without surgery including progeressive enlargement, pain, incarceration, & strangulation was discussed. Contributors to complications such as smoking, obesity, diabetes, prior surgery, etc were discussed.  I feel the risks of no intervention will lead to serious problems that outweigh the operative risks; therefore, I recommended surgery to reduce and repair the hernia. I explained laparoscopic techniques with possible need for an open  approach. I noted the probable use of mesh to patch and/or buttress the hernia repair  Risks such as bleeding, infection, abscess, need for further treatment, heart attack, death, and other risks were discussed. I noted a good likelihood this will help address the problem. Goals of post-operative recovery were  discussed as well. Possibility that this will not correct all symptoms was explained. I stressed the importance of low-impact activity, aggressive pain control, avoiding constipation, & not pushing through pain to minimize risk of post-operative chronic pain or injury. Possibility of reherniation especially with smoking, obesity, diabetes, immunosuppression, and other health conditions was discussed. We will work to minimize complications.  An educational handout further explaining the pathology & treatment options was given as well. Questions were answered. The patient expresses understanding & wishes to proceed with surgery.  PREOP - ING HERNIA - ENCOUNTER FOR PREOPERATIVE EXAMINATION FOR GENERAL SURGICAL PROCEDURE (Z01.818)  Current Plans  You are being scheduled for surgery - Our schedulers will call you.  You should hear from our office's scheduling department within 5 working days about the location, date, and time of surgery. We try to make accommodations for patient's preferences in scheduling surgery, but sometimes the OR schedule or the surgeon's schedule prevents [place] from making those accommodations.  If you have not heard from our office [phone]) in 5 working days, call the office and ask for your surgeon's nurse.  If you have other questions about your diagnosis, plan, or surgery, call the office and ask for your surgeon's nurse.  Written instructions provided  Pt Education - Pamphlet Given - Laparoscopic Hernia Repair: discussed with patient and provided information.  Pt Education - [facility] Pain Control (Shalen Petrak)  Pt Education - [facility] Hernia Post-Op HCI (Philipe Laswell): discussed with patient and provided information.  Pt Education - [facility] Mesh education: discussed with patient and provided information.  Adin Hector, MD, FACS, MASCRS  Gastrointestinal and Minimally Invasive Surgery  [facility]  [address] [place], [place] [address]  [phone] Main / Paging   [phone] Fax     Addendum: Patient delayed surgery till this year.  He is now ready consider it.

## 2019-05-26 NOTE — Progress Notes (Addendum)
Spoke w/ via phone for pre-op interview---wife patsy per patient request Lab needs dos---- none             COVID test ------05-30-2019 1500 Arrive at -------930 am 06-02-2019 NPO after ------midnight Medications to take morning of surgery -----certrizine Diabetic medication -----n/a Patient Special Instructions -----none Pre-Op special Istructions -----none Patient verbalized understanding of instructions that were given at this phone interview. Patient denies shortness of breath, chest pain, fever, cough a this phone interview.  Old ekg 12-22-2017 epic

## 2019-05-26 NOTE — H&P (Signed)
Carl Mccullough  Documented: 01/17/2019 4:04 PM  Location: [facility]  Patient #: [id number]  DOB: 02-03-1956  Married / Language: Carl Mccullough / Race: White  Male  History of Present Illness Adin Hector MD; 01/17/2019 4:45 PM)  The patient is a 64 year old male who presents with an inguinal hernia. Note for "Inguinal hernia": `  `  `  Patient sent for surgical consultation at the request of Dr Hilarie Fredrickson  Chief Complaint: Left lower quadrant and groin pain. Hernias.  `  `  The patient is a pleasant active male. History of diverticulitis and diverticulosis. Had an episode of lower abdominal and groin pain that concerned him. Underwent CAT scan that showed diverticulosis. No definite diverticulitis. Fat containing inguinal hernias. He was sent to see Dr. Gaspar Bidding [facility]. I do not have those records yet, but the patient recalls that there were no major concerns. Patient does not have severe nocturia or dysuria. Underwent colonoscopy that was showing no stricture or inflammation. Benign hyperplastic and adenomatous polyps removed. Surgical consultation recommended. Patient usually moves his bowels once a day. He can walk at least half hour without difficulty. 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 No smoking. No diabetes. No history of MRSA skin infections. No history of urinary tract infections.  Patient comes a with his wife.  (Review of systems as stated in this history (HPI) or in the review of systems. Otherwise all other 12 point ROS are negative)  `  `  `  Past Surgical History Emeline Gins, [place]; 01/17/2019 4:04 PM)  Colon Polyp Removal - Open  Diagnostic Studies History Emeline Gins, [place]; 01/17/2019 4:04 PM)  Colonoscopy within last year  1-5 years ago  Allergies Emeline Gins, CMA; 01/17/2019 4:05 PM)  No Known Drug Allergies [12/14/2014]:  Allergies Reconciled  Medication History Emeline Gins, [place]; 01/17/2019 4:06 PM)    Vitamin D (Ergocalciferol) (50000UNIT Capsule, Oral) Active.  Fluzone High-Dose Quadrivalent (0.7ML Susp Pref Syr, Intramuscular) Active.  Fluticasone Propionate (50MCG/ACT Suspension, Nasal) Active.  Cetirizine HCl (10MG  Tablet, Oral) Active.  Sildenafil Citrate (20MG  Tablet, Oral) Active.  Medications Reconciled  Social History Emeline Gins, [place]; 01/17/2019 4:04 PM)  Alcohol use Occasional alcohol use.  No drug use  Tobacco use Never smoker.  Family History Emeline Gins, [place]; 01/17/2019 4:04 PM)  Cancer Father.  Colon Cancer Father.  Hypertension Brother, Mother.  Thyroid problems Mother.  Review of Systems Emeline Gins CMA; 01/17/2019 4:04 PM)  General Not Present- Appetite Loss, Chills, Fatigue, Fever, Night Sweats, Weight Gain and Weight Loss.  Skin Not Present- Change in Wart/Mole, Dryness, Hives, Jaundice, New Lesions, Non-Healing Wounds, Rash and Ulcer.  HEENT Present- Hearing Loss, Ringing in the Ears and Wears glasses/contact lenses. Not Present- Earache, Hoarseness, Nose Bleed, Oral Ulcers, Seasonal Allergies, Sinus Pain, Sore Throat, Visual Disturbances and Yellow Eyes.  Respiratory Not Present- Bloody sputum, Chronic Cough, Difficulty Breathing, Snoring and Wheezing.  Breast Not Present- Breast Mass, Breast Pain, Nipple Discharge and Skin Changes.  Cardiovascular Not Present- Chest Pain, Difficulty Breathing Lying Down, Leg Cramps, Palpitations, Rapid Heart Rate, Shortness of Breath and Swelling of Extremities.  Gastrointestinal Not Present- Abdominal Pain, Bloating, Bloody Stool, Change in Bowel Habits, Chronic diarrhea, Constipation, Difficulty Swallowing, Excessive gas, Gets full quickly at meals, Hemorrhoids, Indigestion, Nausea, Rectal Pain and Vomiting.  Male Genitourinary Not Present- Blood in Urine, Change in Urinary Stream, Frequency, Impotence, Nocturia, Painful Urination, Urgency and Urine Leakage.  Vitals Emeline Gins CMA; 01/17/2019 4:05  PM)   01/17/2019 4:04 PM  Weight: 187.8 lb Height: 70 in  Body Surface Area: 2.03 m Body Mass Index: 26.95 kg/m  Temp.: 98.4 F Pulse: 92 (Regular)  BP: 138/94 (Sitting, Left Arm, Standard)  Physical Exam Adin Hector MD; 01/17/2019 4:43 PM)  General  Mental Status - Alert.  General Appearance - Not in acute distress, Not Sickly.  Orientation - Oriented X3.  Hydration - Well hydrated.  Voice - Normal.  Integumentary  Global Assessment  Upon inspection and palpation of skin surfaces of the - Axillae: non-tender, no inflammation or ulceration, no drainage. and Distribution of scalp and body hair is normal.  General Characteristics  Temperature - normal warmth is noted.  Head and Neck  Head - normocephalic, atraumatic with no lesions or palpable masses.  Face  Global Assessment - atraumatic, no absence of expression.  Neck  Global Assessment - no abnormal movements, no bruit auscultated on the right, no bruit auscultated on the left, no decreased range of motion, non-tender.  Trachea - midline.  Thyroid  Gland Characteristics - non-tender.  Eye  Eyeball - Left - Extraocular movements intact, No Nystagmus - Left.  Eyeball - Right - Extraocular movements intact, No Nystagmus - Right.  Cornea - Left - No Hazy - Left.  Cornea - Right - No Hazy - Right.  Sclera/Conjunctiva - Left - No scleral icterus, No Discharge - Left.  Sclera/Conjunctiva - Right - No scleral icterus, No Discharge - Right.  Pupil - Left - Direct reaction to light normal.  Pupil - Right - Direct reaction to light normal.  ENMT  Ears  Pinna - Left - no drainage observed, no generalized tenderness observed. Pinna - Right - no drainage observed, no generalized tenderness observed.  Nose and Sinuses  External Inspection of the Nose - no destructive lesion observed. Inspection of the nares - Left - quiet respiration. Inspection of the nares - Right - quiet respiration.  Mouth and Throat  Lips - Upper Lip - no fissures  observed, no pallor noted. Lower Lip - no fissures observed, no pallor noted. Nasopharynx - no discharge present. Oral Cavity/Oropharynx - Tongue - no dryness observed. Oral Mucosa - no cyanosis observed. Hypopharynx - no evidence of airway distress observed.  Chest and Lung Exam  Inspection  Movements - Normal and Symmetrical. Accessory muscles - No use of accessory muscles in breathing.  Palpation  Palpation of the chest reveals - Non-tender.  Auscultation  Breath sounds - Normal and Clear.  Cardiovascular  Auscultation  Rhythm - Regular. Murmurs & Other Heart Sounds - Auscultation of the heart reveals - No Murmurs and No Systolic Clicks.  Abdomen  Inspection  Inspection of the abdomen reveals - No Visible peristalsis and No Abnormal pulsations. Umbilicus - No Bleeding, No Urine drainage.  Palpation/Percussion  Palpation and Percussion of the abdomen reveal - Soft, Non Tender, No Rebound tenderness, No Rigidity (guarding) and No Cutaneous hyperesthesia.  Note: Abdomen soft. Nontender. Mild diastases recti. Small umbilical hernia at the base of the stalk. Present on Valsalva only. Not distended. No guarding.  Male Genitourinary  Sexual Maturity  Tanner 5 - Adult hair pattern and Adult penile size and shape.  Note: Left greater than right inguinal hernias. Left one more sensitive going down to upper scrotum. Both reducible. Otherwise normal external male genitalia.  Peripheral Vascular  Upper Extremity  Inspection - Left - No Cyanotic nailbeds - Left, Not Ischemic. Inspection - Right - No Cyanotic nailbeds - Right, Not  Ischemic.  Neurologic  Neurologic evaluation reveals - normal attention span and ability to concentrate, able to name objects and repeat phrases. Appropriate fund of knowledge , normal sensation and normal coordination.  Mental Status  Affect - not angry, not paranoid.  Cranial Nerves - Normal Bilaterally.  Gait - Normal.  Neuropsychiatric  Mental status exam performed  with findings of - able to articulate well with normal speech/language, rate, volume and coherence, thought content normal with ability to perform basic computations and apply abstract reasoning and no evidence of hallucinations, delusions, obsessions or homicidal/suicidal ideation.  Musculoskeletal  Global Assessment  Spine, Ribs and Pelvis - no instability, subluxation or laxity. Right Upper Extremity - no instability, subluxation or laxity.  Lymphatic  Head & Neck  General Head & Neck Lymphatics: Bilateral - Description - No Localized lymphadenopathy.  Axillary  General Axillary Region: Bilateral - Description - No Localized lymphadenopathy.  Femoral & Inguinal  Generalized Femoral & Inguinal Lymphatics: Left - Description - No Localized lymphadenopathy. Right - Description - No Localized lymphadenopathy.  Assessment & Plan Adin Hector MD; 01/17/2019 4:41 PM)  BILATERAL INGUINAL HERNIA WITHOUT OBSTRUCTION OR GANGRENE, RECURRENCE NOT SPECIFIED (K40.20)  Impression: BIH, left > right  I recommended repair. lap approach. He is not in severe pain, so this is not an emergency. There focusing on caring for his mother-in-law, so most likely we'll wait until the new year. He is interested in proceeding at some point. Small umbilical hernia can be repaired primarily at the same time.    Current Plans  The anatomy & physiology of the abdominal wall and pelvic floor was discussed. The pathophysiology of hernias in the inguinal and pelvic region was discussed. Natural history risks such as progressive enlargement, pain, incarceration, and strangulation was discussed. Contributors to complications such as smoking, obesity, diabetes, prior surgery, etc were discussed.  I feel the risks of no intervention will lead to serious problems that outweigh the operative risks; therefore, I recommended surgery to reduce and repair the hernia. I explained laparoscopic techniques with possible need for an open  approach. I noted usual use of mesh to patch and/or buttress hernia repair  Risks such as bleeding, infection, abscess, need for further treatment, heart attack, death, and other risks were discussed. I noted a good likelihood this will help address the problem. Goals of post-operative recovery were discussed as well. Possibility that this will not correct all symptoms was explained. I stressed the importance of low-impact activity, aggressive pain control, avoiding constipation, & not pushing through pain to minimize risk of post-operative chronic pain or injury. Possibility of reherniation was discussed. We will work to minimize complications.  An educational handout further explaining the pathology & treatment options was given as well. Questions were answered. The patient expresses understanding & wishes to proceed with surgery.  UMBILICAL HERNIA WITHOUT OBSTRUCTION AND WITHOUT GANGRENE (K42.9)  Impression: Umbilical hernia is found on Valsalva. About 1 cm in size. It should be able to control with suture repair only  Current Plans  The anatomy & physiology of the abdominal wall was discussed. The pathophysiology of hernias was discussed. Natural history risks without surgery including progeressive enlargement, pain, incarceration, & strangulation was discussed. Contributors to complications such as smoking, obesity, diabetes, prior surgery, etc were discussed.  I feel the risks of no intervention will lead to serious problems that outweigh the operative risks; therefore, I recommended surgery to reduce and repair the hernia. I explained laparoscopic techniques with possible need for an open  approach. I noted the probable use of mesh to patch and/or buttress the hernia repair  Risks such as bleeding, infection, abscess, need for further treatment, heart attack, death, and other risks were discussed. I noted a good likelihood this will help address the problem. Goals of post-operative recovery were  discussed as well. Possibility that this will not correct all symptoms was explained. I stressed the importance of low-impact activity, aggressive pain control, avoiding constipation, & not pushing through pain to minimize risk of post-operative chronic pain or injury. Possibility of reherniation especially with smoking, obesity, diabetes, immunosuppression, and other health conditions was discussed. We will work to minimize complications.  An educational handout further explaining the pathology & treatment options was given as well. Questions were answered. The patient expresses understanding & wishes to proceed with surgery.  PREOP - ING HERNIA - ENCOUNTER FOR PREOPERATIVE EXAMINATION FOR GENERAL SURGICAL PROCEDURE (Z01.818)  Current Plans  You are being scheduled for surgery - Our schedulers will call you.  You should hear from our office's scheduling department within 5 working days about the location, date, and time of surgery. We try to make accommodations for patient's preferences in scheduling surgery, but sometimes the OR schedule or the surgeon's schedule prevents [place] from making those accommodations.  If you have not heard from our office [phone]) in 5 working days, call the office and ask for your surgeon's nurse.  If you have other questions about your diagnosis, plan, or surgery, call the office and ask for your surgeon's nurse.  Written instructions provided  Pt Education - Pamphlet Given - Laparoscopic Hernia Repair: discussed with patient and provided information.  Pt Education - CCS Pain Control (Carmelo Reidel)  Pt Education - CCS Hernia Post-Op HCI (Damica Gravlin): discussed with patient and provided information.  Pt Education - CCS Mesh education: discussed with patient and provided information.  Adin Hector, MD, FACS, MASCRS  Gastrointestinal and Minimally Invasive Surgery  [facility]  [address] [place], [place] [address]  [phone] Main / Paging   [phone] Fax     Addendum: Patient delayed surgery till this year.  He is now ready consider it.

## 2019-05-30 ENCOUNTER — Other Ambulatory Visit (HOSPITAL_COMMUNITY)
Admission: RE | Admit: 2019-05-30 | Discharge: 2019-05-30 | Disposition: A | Discharge status: Home / Self Care | Payer: BC Managed Care – PPO | Source: Ambulatory Visit | Attending: Surgery | Admitting: Surgery

## 2019-05-30 DIAGNOSIS — Z01812 Encounter for preprocedural laboratory examination: Secondary | ICD-10-CM | POA: Insufficient documentation

## 2019-05-30 DIAGNOSIS — Z20822 Contact with and (suspected) exposure to covid-19: Secondary | ICD-10-CM | POA: Insufficient documentation

## 2019-05-30 LAB — SARS CORONAVIRUS 2 (TAT 6-24 HRS): SARS Coronavirus 2: NEGATIVE

## 2019-06-02 ENCOUNTER — Ambulatory Visit (HOSPITAL_BASED_OUTPATIENT_CLINIC_OR_DEPARTMENT_OTHER): Payer: BC Managed Care – PPO | Admitting: Anesthesiology

## 2019-06-02 ENCOUNTER — Other Ambulatory Visit: Payer: Self-pay

## 2019-06-02 ENCOUNTER — Ambulatory Visit (HOSPITAL_BASED_OUTPATIENT_CLINIC_OR_DEPARTMENT_OTHER)
Admission: RE | Admit: 2019-06-02 | Discharge: 2019-06-02 | Disposition: A | Discharge status: Home / Self Care | Payer: BC Managed Care – PPO | Attending: Surgery | Admitting: Surgery

## 2019-06-02 ENCOUNTER — Encounter (HOSPITAL_BASED_OUTPATIENT_CLINIC_OR_DEPARTMENT_OTHER)
Admission: RE | Disposition: A | Discharge status: Home / Self Care | Payer: Self-pay | Source: Home / Self Care | Attending: Surgery

## 2019-06-02 ENCOUNTER — Encounter (HOSPITAL_BASED_OUTPATIENT_CLINIC_OR_DEPARTMENT_OTHER): Payer: Self-pay | Admitting: Surgery

## 2019-06-02 DIAGNOSIS — K4020 Bilateral inguinal hernia, without obstruction or gangrene, not specified as recurrent: Secondary | ICD-10-CM | POA: Diagnosis present

## 2019-06-02 DIAGNOSIS — K458 Other specified abdominal hernia without obstruction or gangrene: Secondary | ICD-10-CM | POA: Diagnosis not present

## 2019-06-02 DIAGNOSIS — K4190 Unilateral femoral hernia, without obstruction or gangrene, not specified as recurrent: Secondary | ICD-10-CM | POA: Diagnosis not present

## 2019-06-02 DIAGNOSIS — I10 Essential (primary) hypertension: Secondary | ICD-10-CM | POA: Diagnosis not present

## 2019-06-02 DIAGNOSIS — Z79899 Other long term (current) drug therapy: Secondary | ICD-10-CM | POA: Insufficient documentation

## 2019-06-02 DIAGNOSIS — D176 Benign lipomatous neoplasm of spermatic cord: Secondary | ICD-10-CM | POA: Diagnosis not present

## 2019-06-02 HISTORY — DX: Personal history of other diseases of the circulatory system: Z86.79

## 2019-06-02 HISTORY — PX: INGUINAL HERNIA REPAIR: SHX194

## 2019-06-02 SURGERY — REPAIR, HERNIA, INGUINAL, BILATERAL, LAPAROSCOPIC
Anesthesia: General | Site: Abdomen

## 2019-06-02 MED ORDER — CELECOXIB 200 MG PO CAPS
200.0000 mg | ORAL_CAPSULE | ORAL | Status: AC
  Administered 2019-06-02: 200 mg via ORAL
  Filled 2019-06-02: qty 1

## 2019-06-02 MED ORDER — CHLORHEXIDINE GLUCONATE CLOTH 2 % EX PADS
6.0000 | MEDICATED_PAD | Freq: Once | CUTANEOUS | Status: DC
  Filled 2019-06-02: qty 6

## 2019-06-02 MED ORDER — FENTANYL CITRATE (PF) 100 MCG/2ML IJ SOLN
INTRAMUSCULAR | Status: AC
  Filled 2019-06-02: qty 2

## 2019-06-02 MED ORDER — TRAMADOL HCL 50 MG PO TABS
50.0000 mg | ORAL_TABLET | Freq: Four times a day (QID) | ORAL | Status: DC | PRN
  Administered 2019-06-02: 50 mg via ORAL
  Filled 2019-06-02: qty 2

## 2019-06-02 MED ORDER — CEFAZOLIN SODIUM-DEXTROSE 2-4 GM/100ML-% IV SOLN
2.0000 g | INTRAVENOUS | Status: AC
  Administered 2019-06-02: 13:00:00 2 g via INTRAVENOUS
  Filled 2019-06-02: qty 100

## 2019-06-02 MED ORDER — ENOXAPARIN SODIUM 40 MG/0.4ML ~~LOC~~ SOLN
40.0000 mg | SUBCUTANEOUS | Status: DC
  Filled 2019-06-02: qty 0.4

## 2019-06-02 MED ORDER — LIDOCAINE 2% (20 MG/ML) 5 ML SYRINGE
INTRAMUSCULAR | Status: AC
  Filled 2019-06-02: qty 5

## 2019-06-02 MED ORDER — ACETAMINOPHEN 500 MG PO TABS
ORAL_TABLET | ORAL | Status: AC
  Filled 2019-06-02: qty 2

## 2019-06-02 MED ORDER — ONDANSETRON 4 MG PO TBDP
4.0000 mg | ORAL_TABLET | Freq: Four times a day (QID) | ORAL | Status: DC | PRN
  Filled 2019-06-02: qty 1

## 2019-06-02 MED ORDER — GABAPENTIN 300 MG PO CAPS
300.0000 mg | ORAL_CAPSULE | Freq: Two times a day (BID) | ORAL | Status: DC
  Administered 2019-06-02: 300 mg via ORAL
  Filled 2019-06-02: qty 1

## 2019-06-02 MED ORDER — BUPIVACAINE LIPOSOME 1.3 % IJ SUSP
INTRAMUSCULAR | Status: DC | PRN
  Administered 2019-06-02: 20 mL

## 2019-06-02 MED ORDER — ACETAMINOPHEN 500 MG PO TABS
1000.0000 mg | ORAL_TABLET | ORAL | Status: AC
  Administered 2019-06-02: 10:00:00 1000 mg via ORAL
  Filled 2019-06-02: qty 2

## 2019-06-02 MED ORDER — CEFAZOLIN SODIUM-DEXTROSE 2-4 GM/100ML-% IV SOLN
INTRAVENOUS | Status: AC
  Filled 2019-06-02: qty 100

## 2019-06-02 MED ORDER — GABAPENTIN 300 MG PO CAPS
ORAL_CAPSULE | ORAL | Status: AC
  Filled 2019-06-02: qty 1

## 2019-06-02 MED ORDER — LIP MEDEX EX OINT
1.0000 "application " | TOPICAL_OINTMENT | Freq: Two times a day (BID) | CUTANEOUS | Status: DC
  Filled 2019-06-02: qty 7

## 2019-06-02 MED ORDER — SUGAMMADEX SODIUM 200 MG/2ML IV SOLN
INTRAVENOUS | Status: DC | PRN
  Administered 2019-06-02: 175 mg via INTRAVENOUS

## 2019-06-02 MED ORDER — LACTATED RINGERS IV SOLN
INTRAVENOUS | Status: DC
  Filled 2019-06-02: qty 1000

## 2019-06-02 MED ORDER — ROCURONIUM BROMIDE 10 MG/ML (PF) SYRINGE
PREFILLED_SYRINGE | INTRAVENOUS | Status: AC
  Filled 2019-06-02: qty 10

## 2019-06-02 MED ORDER — METHOCARBAMOL 750 MG PO TABS
750.0000 mg | ORAL_TABLET | Freq: Four times a day (QID) | ORAL | Status: DC | PRN
  Filled 2019-06-02: qty 1

## 2019-06-02 MED ORDER — LIDOCAINE 2% (20 MG/ML) 5 ML SYRINGE
INTRAMUSCULAR | Status: DC | PRN
  Administered 2019-06-02: 1.5 mg/kg/h via INTRAVENOUS

## 2019-06-02 MED ORDER — SODIUM CHLORIDE 0.9% FLUSH
3.0000 mL | Freq: Two times a day (BID) | INTRAVENOUS | Status: DC
  Filled 2019-06-02: qty 3

## 2019-06-02 MED ORDER — DIPHENHYDRAMINE HCL 50 MG/ML IJ SOLN
12.5000 mg | Freq: Four times a day (QID) | INTRAMUSCULAR | Status: DC | PRN
  Filled 2019-06-02: qty 0.25

## 2019-06-02 MED ORDER — PROPOFOL 10 MG/ML IV BOLUS
INTRAVENOUS | Status: DC | PRN
  Administered 2019-06-02: 180 mg via INTRAVENOUS

## 2019-06-02 MED ORDER — MIDAZOLAM HCL 5 MG/5ML IJ SOLN
INTRAMUSCULAR | Status: DC | PRN
  Administered 2019-06-02: 2 mg via INTRAVENOUS

## 2019-06-02 MED ORDER — EPHEDRINE SULFATE-NACL 50-0.9 MG/10ML-% IV SOSY
PREFILLED_SYRINGE | INTRAVENOUS | Status: DC | PRN
  Administered 2019-06-02: 10 mg via INTRAVENOUS

## 2019-06-02 MED ORDER — MENTHOL 3 MG MT LOZG
1.0000 | LOZENGE | OROMUCOSAL | Status: DC | PRN
  Filled 2019-06-02: qty 9

## 2019-06-02 MED ORDER — MIDAZOLAM HCL 2 MG/2ML IJ SOLN
INTRAMUSCULAR | Status: AC
  Filled 2019-06-02: qty 2

## 2019-06-02 MED ORDER — DIPHENHYDRAMINE HCL 12.5 MG/5ML PO ELIX
12.5000 mg | ORAL_SOLUTION | Freq: Four times a day (QID) | ORAL | Status: DC | PRN
  Filled 2019-06-02: qty 5

## 2019-06-02 MED ORDER — BISACODYL 10 MG RE SUPP
10.0000 mg | Freq: Two times a day (BID) | RECTAL | Status: DC | PRN
  Filled 2019-06-02: qty 1

## 2019-06-02 MED ORDER — TRAMADOL HCL 50 MG PO TABS: 50.0000 mg | ORAL_TABLET | Freq: Four times a day (QID) | ORAL | 0 refills | Status: DC | PRN

## 2019-06-02 MED ORDER — ONDANSETRON HCL 4 MG/2ML IJ SOLN
INTRAMUSCULAR | Status: AC
  Filled 2019-06-02: qty 2

## 2019-06-02 MED ORDER — BISACODYL 10 MG RE SUPP
10.0000 mg | Freq: Every day | RECTAL | Status: DC | PRN
  Filled 2019-06-02: qty 1

## 2019-06-02 MED ORDER — 0.9 % SODIUM CHLORIDE (POUR BTL) OPTIME
TOPICAL | Status: DC | PRN
  Administered 2019-06-02: 11:00:00 2000 mL

## 2019-06-02 MED ORDER — GABAPENTIN 300 MG PO CAPS
300.0000 mg | ORAL_CAPSULE | ORAL | Status: AC
  Administered 2019-06-02: 300 mg via ORAL
  Filled 2019-06-02: qty 1

## 2019-06-02 MED ORDER — SIMETHICONE 80 MG PO CHEW
40.0000 mg | CHEWABLE_TABLET | Freq: Four times a day (QID) | ORAL | Status: DC | PRN
  Filled 2019-06-02: qty 1

## 2019-06-02 MED ORDER — BUPIVACAINE LIPOSOME 1.3 % IJ SUSP
20.0000 mL | Freq: Once | INTRAMUSCULAR | Status: DC
  Filled 2019-06-02: qty 20

## 2019-06-02 MED ORDER — POLYETHYLENE GLYCOL 3350 17 G PO PACK
17.0000 g | PACK | Freq: Every day | ORAL | Status: DC | PRN
  Filled 2019-06-02: qty 1

## 2019-06-02 MED ORDER — METOPROLOL TARTRATE 5 MG/5ML IV SOLN
5.0000 mg | Freq: Four times a day (QID) | INTRAVENOUS | Status: DC | PRN
  Filled 2019-06-02: qty 5

## 2019-06-02 MED ORDER — TRAMADOL HCL 50 MG PO TABS
ORAL_TABLET | ORAL | Status: AC
  Filled 2019-06-02: qty 1

## 2019-06-02 MED ORDER — HYDROCORTISONE (PERIANAL) 2.5 % EX CREA
1.0000 "application " | TOPICAL_CREAM | Freq: Four times a day (QID) | CUTANEOUS | Status: DC | PRN
  Filled 2019-06-02: qty 28.35

## 2019-06-02 MED ORDER — FENTANYL CITRATE (PF) 100 MCG/2ML IJ SOLN
25.0000 ug | INTRAMUSCULAR | Status: DC | PRN
  Administered 2019-06-02: 25 ug via INTRAVENOUS
  Filled 2019-06-02: qty 1

## 2019-06-02 MED ORDER — GUAIFENESIN-DM 100-10 MG/5ML PO SYRP
10.0000 mL | ORAL_SOLUTION | ORAL | Status: DC | PRN
  Filled 2019-06-02: qty 10

## 2019-06-02 MED ORDER — PROPOFOL 10 MG/ML IV BOLUS
INTRAVENOUS | Status: AC
  Filled 2019-06-02: qty 20

## 2019-06-02 MED ORDER — PROCHLORPERAZINE EDISYLATE 10 MG/2ML IJ SOLN
5.0000 mg | Freq: Four times a day (QID) | INTRAMUSCULAR | Status: DC | PRN
  Filled 2019-06-02: qty 2

## 2019-06-02 MED ORDER — SODIUM CHLORIDE 0.9 % IV SOLN
250.0000 mL | INTRAVENOUS | Status: DC | PRN
  Filled 2019-06-02: qty 250

## 2019-06-02 MED ORDER — SODIUM CHLORIDE 0.9% FLUSH
3.0000 mL | INTRAVENOUS | Status: DC | PRN
  Filled 2019-06-02: qty 3

## 2019-06-02 MED ORDER — KETAMINE HCL 10 MG/ML IJ SOLN
INTRAMUSCULAR | Status: DC | PRN
  Administered 2019-06-02 (×2): 15 mg via INTRAVENOUS

## 2019-06-02 MED ORDER — PHENOL 1.4 % MT LIQD
1.0000 | OROMUCOSAL | Status: DC | PRN
  Filled 2019-06-02: qty 177

## 2019-06-02 MED ORDER — BUPIVACAINE HCL (PF) 0.25 % IJ SOLN
INTRAMUSCULAR | Status: DC | PRN
  Administered 2019-06-02: 60 mL

## 2019-06-02 MED ORDER — POLYETHYLENE GLYCOL 3350 17 G PO PACK
17.0000 g | PACK | Freq: Two times a day (BID) | ORAL | Status: DC | PRN
  Filled 2019-06-02: qty 1

## 2019-06-02 MED ORDER — ENSURE PRE-SURGERY PO LIQD
296.0000 mL | Freq: Once | ORAL | Status: DC
  Filled 2019-06-02: qty 296

## 2019-06-02 MED ORDER — ALUM & MAG HYDROXIDE-SIMETH 200-200-20 MG/5ML PO SUSP
30.0000 mL | Freq: Four times a day (QID) | ORAL | Status: DC | PRN
  Filled 2019-06-02: qty 30

## 2019-06-02 MED ORDER — PROCHLORPERAZINE MALEATE 10 MG PO TABS
10.0000 mg | ORAL_TABLET | Freq: Four times a day (QID) | ORAL | Status: DC | PRN
  Filled 2019-06-02: qty 1

## 2019-06-02 MED ORDER — ACETAMINOPHEN 500 MG PO TABS
1000.0000 mg | ORAL_TABLET | Freq: Three times a day (TID) | ORAL | Status: DC
  Administered 2019-06-02: 1000 mg via ORAL
  Filled 2019-06-02: qty 2

## 2019-06-02 MED ORDER — CELECOXIB 200 MG PO CAPS
ORAL_CAPSULE | ORAL | Status: AC
  Filled 2019-06-02: qty 1

## 2019-06-02 MED ORDER — DEXAMETHASONE SODIUM PHOSPHATE 10 MG/ML IJ SOLN
INTRAMUSCULAR | Status: AC
  Filled 2019-06-02: qty 1

## 2019-06-02 MED ORDER — DEXAMETHASONE SODIUM PHOSPHATE 10 MG/ML IJ SOLN
INTRAMUSCULAR | Status: DC | PRN
  Administered 2019-06-02: 10 mg via INTRAVENOUS

## 2019-06-02 MED ORDER — ROCURONIUM BROMIDE 10 MG/ML (PF) SYRINGE
PREFILLED_SYRINGE | INTRAVENOUS | Status: DC | PRN
  Administered 2019-06-02: 10 mg via INTRAVENOUS
  Administered 2019-06-02: 50 mg via INTRAVENOUS

## 2019-06-02 MED ORDER — FENTANYL CITRATE (PF) 100 MCG/2ML IJ SOLN
INTRAMUSCULAR | Status: DC | PRN
  Administered 2019-06-02 (×2): 50 ug via INTRAVENOUS

## 2019-06-02 MED ORDER — MAGIC MOUTHWASH
15.0000 mL | Freq: Four times a day (QID) | ORAL | Status: DC | PRN
  Filled 2019-06-02: qty 15

## 2019-06-02 MED ORDER — METHOCARBAMOL 1000 MG/10ML IJ SOLN
1000.0000 mg | Freq: Four times a day (QID) | INTRAVENOUS | Status: DC | PRN
  Filled 2019-06-02: qty 10

## 2019-06-02 MED ORDER — HYDROMORPHONE HCL 1 MG/ML IJ SOLN
0.5000 mg | INTRAMUSCULAR | Status: DC | PRN
  Filled 2019-06-02: qty 2

## 2019-06-02 MED ORDER — ONDANSETRON HCL 4 MG/2ML IJ SOLN
4.0000 mg | Freq: Four times a day (QID) | INTRAMUSCULAR | Status: DC | PRN
  Filled 2019-06-02: qty 2

## 2019-06-02 MED ORDER — ONDANSETRON HCL 4 MG/2ML IJ SOLN
INTRAMUSCULAR | Status: DC | PRN
  Administered 2019-06-02: 4 mg via INTRAVENOUS

## 2019-06-02 MED ORDER — EPHEDRINE 5 MG/ML INJ
INTRAVENOUS | Status: AC
  Filled 2019-06-02: qty 10

## 2019-06-02 MED ORDER — LACTATED RINGERS IV BOLUS
1000.0000 mL | Freq: Three times a day (TID) | INTRAVENOUS | Status: DC | PRN
  Filled 2019-06-02: qty 1000

## 2019-06-02 MED ORDER — HYDROCORTISONE 1 % EX CREA
1.0000 "application " | TOPICAL_CREAM | Freq: Three times a day (TID) | CUTANEOUS | Status: DC | PRN
  Filled 2019-06-02: qty 28

## 2019-06-02 MED ORDER — ONDANSETRON HCL 4 MG/2ML IJ SOLN
4.0000 mg | Freq: Once | INTRAMUSCULAR | Status: DC | PRN
  Filled 2019-06-02: qty 2

## 2019-06-02 SURGICAL SUPPLY — 39 items
CABLE HIGH FREQUENCY MONO STRZ (ELECTRODE) ×3 IMPLANT
CANISTER SUCT 3000ML PPV (MISCELLANEOUS) IMPLANT
CHLORAPREP W/TINT 26 (MISCELLANEOUS) ×3 IMPLANT
COVER WAND RF STERILE (DRAPES) ×3 IMPLANT
DECANTER SPIKE VIAL GLASS SM (MISCELLANEOUS) ×3 IMPLANT
DEVICE SECURE STRAP 25 ABSORB (INSTRUMENTS) IMPLANT
DRAPE WARM FLUID 44X44 (DRAPES) ×3 IMPLANT
DRSG TEGADERM 2-3/8X2-3/4 SM (GAUZE/BANDAGES/DRESSINGS) ×4 IMPLANT
DRSG TEGADERM 4X4.75 (GAUZE/BANDAGES/DRESSINGS) ×5 IMPLANT
ELECT REM PT RETURN 9FT ADLT (ELECTROSURGICAL) ×3
ELECTRODE REM PT RTRN 9FT ADLT (ELECTROSURGICAL) ×1 IMPLANT
GLOVE ECLIPSE 8.0 STRL XLNG CF (GLOVE) ×3 IMPLANT
GLOVE INDICATOR 8.0 STRL GRN (GLOVE) ×3 IMPLANT
GOWN STRL REUS W/TWL XL LVL3 (GOWN DISPOSABLE) ×3 IMPLANT
IRRIG SUCT STRYKERFLOW 2 WTIP (MISCELLANEOUS)
IRRIGATION SUCT STRKRFLW 2 WTP (MISCELLANEOUS) IMPLANT
KIT TURNOVER CYSTO (KITS) ×3 IMPLANT
MANIFOLD NEPTUNE II (INSTRUMENTS) IMPLANT
MESH ULTRAPRO 6X6 15CM15CM (Mesh General) ×6 IMPLANT
NEEDLE HYPO 22GX1.5 SAFETY (NEEDLE) ×3 IMPLANT
NS IRRIG 500ML POUR BTL (IV SOLUTION) ×3 IMPLANT
PACK BASIN DAY SURGERY FS (CUSTOM PROCEDURE TRAY) ×3 IMPLANT
PAD POSITIONING PINK XL (MISCELLANEOUS) ×3 IMPLANT
SCISSORS LAP 5X35 DISP (ENDOMECHANICALS) ×3 IMPLANT
SET TUBE SMOKE EVAC HIGH FLOW (TUBING) ×3 IMPLANT
SLEEVE ADV FIXATION 5X100MM (TROCAR) ×3 IMPLANT
SPONGE GAUZE 2X2 8PLY STER LF (GAUZE/BANDAGES/DRESSINGS) ×1
SPONGE GAUZE 2X2 8PLY STRL LF (GAUZE/BANDAGES/DRESSINGS) ×4 IMPLANT
SUT MNCRL AB 4-0 PS2 18 (SUTURE) ×3 IMPLANT
SUT PDS AB 1 CT1 27 (SUTURE) IMPLANT
SUT VIC AB 2-0 SH 27 (SUTURE)
SUT VIC AB 2-0 SH 27XBRD (SUTURE) IMPLANT
SUT VICRYL 0 UR6 27IN ABS (SUTURE) IMPLANT
TOWEL OR 17X26 10 PK STRL BLUE (TOWEL DISPOSABLE) ×3 IMPLANT
TRAY DSU PREP LF (CUSTOM PROCEDURE TRAY) ×3 IMPLANT
TRAY LAPAROSCOPIC (CUSTOM PROCEDURE TRAY) ×3 IMPLANT
TROCAR ADV FIXATION 5X100MM (TROCAR) ×3 IMPLANT
TROCAR XCEL BLUNT TIP 100MML (ENDOMECHANICALS) ×3 IMPLANT
WATER STERILE IRR 500ML POUR (IV SOLUTION) ×3 IMPLANT

## 2019-06-02 NOTE — Progress Notes (Signed)
Contacted by Dr. Annye Asa with Anesthesia in person.  He evaluated the patient and the patient has good hip flexion & leg lifts.  No any major lower extremity weakness.  Dr Annye Asa feels it is reasonable to observe for a few more hours and make sure he walks in the hallways but reassuring that there is no complete nerve block or other deficiency  Adin Hector, MD, FACS, MASCRS Gastrointestinal and Minimally Invasive Surgery  [facility] [address] [place], [place] [address] [phone] Main / Paging [phone] Fax

## 2019-06-02 NOTE — Anesthesia Preprocedure Evaluation (Addendum)
Anesthesia Evaluation  Patient identified by MRN, date of birth, ID band Patient awake    Reviewed: Allergy & Precautions, NPO status , Patient's Chart, lab work & pertinent test results  Airway Mallampati: III  TM Distance: >3 FB Neck ROM: Full    Dental no notable dental hx.    Pulmonary neg pulmonary ROS,    Pulmonary exam normal breath sounds clear to auscultation       Cardiovascular hypertension, Normal cardiovascular exam Rhythm:Regular Rate:Normal     Neuro/Psych negative neurological ROS  negative psych ROS   GI/Hepatic negative GI ROS, Neg liver ROS,   Endo/Other  negative endocrine ROS  Renal/GU negative Renal ROS     Musculoskeletal  (+) Arthritis ,   Abdominal   Peds  Hematology HLD   Anesthesia Other Findings BILATERAL INGUIANL NEW HERNIA UMBILICAL HERNIA  Reproductive/Obstetrics                            Anesthesia Physical Anesthesia Plan  ASA: II  Anesthesia Plan: General   Post-op Pain Management:    Induction: Intravenous  PONV Risk Score and Plan: 3 and Ondansetron, Dexamethasone, Midazolam and Treatment may vary due to age or medical condition  Airway Management Planned: Oral ETT  Additional Equipment:   Intra-op Plan:   Post-operative Plan: Extubation in OR  Informed Consent: I have reviewed the patients History and Physical, chart, labs and discussed the procedure including the risks, benefits and alternatives for the proposed anesthesia with the patient or authorized representative who has indicated his/her understanding and acceptance.     Dental advisory given  Plan Discussed with: CRNA  Anesthesia Plan Comments:        Anesthesia Quick Evaluation

## 2019-06-02 NOTE — Anesthesia Procedure Notes (Signed)
Procedure Name: Intubation Date/Time: 06/02/2019 12:33 PM Performed by: Hser Belanger D, CRNA Pre-anesthesia Checklist: Patient identified, Emergency Drugs available, Suction available and Patient being monitored Patient Re-evaluated:Patient Re-evaluated prior to induction Oxygen Delivery Method: Circle system utilized Preoxygenation: Pre-oxygenation with 100% oxygen Induction Type: IV induction Ventilation: Mask ventilation without difficulty Laryngoscope Size: Mac and 4 Grade View: Grade I Tube type: Oral Tube size: 7.5 mm Number of attempts: 1 Airway Equipment and Method: Stylet Placement Confirmation: ETT inserted through vocal cords under direct vision,  positive ETCO2 and breath sounds checked- equal and bilateral Secured at: 22 cm Tube secured with: Tape Dental Injury: Teeth and Oropharynx as per pre-operative assessment

## 2019-06-02 NOTE — Op Note (Signed)
06/02/2019  2:23 PM  PATIENT:  Carl Mccullough  64 y.o. male  Patient Care Team: Janora Norlander, DO as PCP - General (Family Medicine) Michael Boston, MD as Consulting Physician (General Surgery) Pyrtle, Lajuan Lines, MD as Consulting Physician (Gastroenterology) Irine Seal, MD as Attending Physician (Urology)  PRE-OPERATIVE DIAGNOSIS:  BILATERAL INGUIANL NEW HERNIA UMBILICAL HERNIA  POST-OPERATIVE DIAGNOSIS:   BILATERAL INGUINAL HERNIAS  PROCEDURE:   LAPAROSCOPIC BILATERAL INGUINAL HERNIA REPAIR  SURGEON:  Adin Hector, MD  ASSISTANT: Fran Lowes, PA-S, [employer]  ANESTHESIA:     Regional ilioinguinal and genitofemoral and spermatic cord nerve blocks  General  Nerve block provided with liposomal bupivacaine (Experel) mixed with 0.25% bupivacaine as a Bilateral TAP block x 59mL each side at the level of the transverse abdominis & preperitoneal spaces along the flank at the anterior axillary line, from subcostal ridge to iliac crest under laparoscopic guidance    EBL:  Total I/O In: 1000 [I.V.:1000] Out: - .  See anesthesia record  Delay start of Pharmacological VTE agent (>24hrs) due to surgical blood loss or risk of bleeding:  no  DRAINS: NONE  SPECIMEN:  NONE  DISPOSITION OF SPECIMEN:  N/A  COUNTS:  YES  PLAN OF CARE: Discharge to home after PACU  PATIENT DISPOSITION:  PACU - hemodynamically stable.  INDICATION: Active patient with evidence of bilateral inguinal hernias.  I recommended operative exploration and repair of hernias found  The anatomy & physiology of the abdominal wall and pelvic floor was discussed.  The pathophysiology of hernias in the inguinal and pelvic region was discussed.  Natural history risks such as progressive enlargement, pain, incarceration & strangulation was discussed.   Contributors to complications such as smoking, obesity, diabetes, prior surgery, etc were discussed.    I feel the risks of no intervention will lead to  serious problems that outweigh the operative risks; therefore, I recommended surgery to reduce and repair the hernia.  I explained laparoscopic techniques with possible need for an open approach.  I noted usual use of mesh to patch and/or buttress hernia repair  Risks such as bleeding, infection, abscess, need for further treatment, heart attack, death, and other risks were discussed.  I noted a good likelihood this will help address the problem.   Goals of post-operative recovery were discussed as well.  Possibility that this will not correct all symptoms was explained.  I stressed the importance of low-impact activity, aggressive pain control, avoiding constipation, & not pushing through pain to minimize risk of post-operative chronic pain or injury. Possibility of reherniation was discussed.  We will work to minimize complications.     An educational handout further explaining the pathology & treatment options was given as well.  Questions were answered.  The patient expresses understanding & wishes to proceed with surgery.  OR FINDINGS:  Moderate size indirect left inguinal hernia with large volume of spermatic cord lipomas.   More subtle indirect inguinal hernia on right side.   No direct space, femoral, obturator hernias.  DESCRIPTION:  The patient was identified & brought into the operating room. The patient was positioned supine with arms tucked. SCDs were active during the entire case. The patient underwent general anesthesia without any difficulty.  The abdomen was prepped and draped in a sterile fashion. The patient's bladder was emptied.  A Surgical Timeout confirmed our plan.  I made a transverse incision through the inferior umbilical fold.  I made a small transverse nick through the anterior rectus fascia  contralateral to the inguinal hernia side and placed a 0-vicryl stitch through the fascia.  I placed a Hasson trocar into the preperitoneal plane.  Entry was clean.  We induced carbon  dioxide insufflation. Camera inspection revealed no injury.  I used a 73mm angled scope to bluntly free the peritoneum off the infraumbilical anterior abdominal wall.  I created enough of a preperitoneal pocket to place 57mm ports into the right & left mid-abdomen into this preperitoneal cavity.  I focused attention on the RIGHT pelvis side.   I used blunt & focused sharp dissection to free the peritoneum off the flank and down to the pubic rim.  I freed the anteriolateral bladder wall off the anteriolateral pelvic wall, sparing midline attachments.   I located a swath of peritoneum going into a hernia fascial defect at the  internal ring consistent with  an indirect inguinal hernia..  I gradually freed the peritoneal hernia sac off safely and reduced it into the preperitoneal space.  I freed the peritoneum off the spermatic vessels & vas deferens.  I freed peritoneum off the retroperitoneum along the psoas muscle.  Spermatic cord lipoma was dissected away & removed.  I checked & assured hemostasis.     I turned attention on the opposite  LEFT pelvis.  I did dissection in a similar, mirror-image fashion. The patient had an indirect inguinal hernia.Marland Kitchen   Spermatic cord lipoma was dissected away & removed.    I checked & assured hemostasis.     I chose 15x15 cm sheets of ultra-lightweight polypropylene mesh (Ultrapro), one for each side.  I cut a single sigmoid-shaped slit ~6cm from a corner of each mesh.  I placed the meshes into the preperitoneal space & laid them as overlapping diamonds such that at the inferior points, a 6x6 cm corner flap rested in the true anterolateral pelvis, covering the obturator & femoral foramina.   I allowed the bladder to return to the pubis, this helping tuck the corners of the mesh in the anteriolateral pelvis.  The medial corners overlapped each other across midline cephalad to the pubic rim.   Given the numerous hernias of moderate size, I placed a third 15x15cm mesh in the center  as a vertical diamond.  The lateral wings of the mesh overlap across the direct spaces and internal rings where the dominant hernias were.  This provided good coverage and reinforcement of the hernia repairs.  Because of the central mesh placement with good overlap, I did not place any tacks.   I held the hernia sacs cephalad & evacuated carbon dioxide.  I closed the fascia with absorbable suture.  I closed the skin using 4-0 monocryl stitch.  Sterile dressings were applied.   The patient was extubated & arrived in the PACU in stable condition..  I had discussed postoperative care with the patient in the holding area.  Instructions are written in the chart.  I discussed operative findings, updated the patient's status, discussed probable steps to recovery, and gave postoperative recommendations to the patient's spouse.  Recommendations were made.  Questions were answered.  She expressed understanding & appreciation.   Adin Hector, M.D., F.A.C.S. Gastrointestinal and Minimally Invasive Surgery [facility] [address], [place], [place] [address] [phone] Main / Paging  06/02/2019 2:23 PM

## 2019-06-02 NOTE — Anesthesia Postprocedure Evaluation (Signed)
Anesthesia Post Note  Patient: Carl Mccullough  Procedure(s) Performed: LAPAROSCOPIC BILATERAL INGUINAL HERNIA REPAIR, PRIMARY UMBILICAL HERNIA REPAIR (N/A Abdomen)     Patient location during evaluation: PACU Anesthesia Type: General Level of consciousness: sedated and patient cooperative Pain management: pain level controlled Vital Signs Assessment: post-procedure vital signs reviewed and stable Respiratory status: spontaneous breathing Cardiovascular status: stable Anesthetic complications: no Comments: Called to PACU by RN stating pt couldn't move his legs due to his "blocks." I assessed the patient thinking that he had received TAP blocks. He was able to lift both legs off the bed briskly (3+/5 strength). I explained to him that it was merely local anesthesia spread and that it soul rapidly improve. Per RNs at [facility] Dr. Johney Maine approved him for overnight, but that he could also be d/c'd home if his strength improves. Once I realized, however, that the only local anesthesia given was by Dr. Johney Maine, I spoke with him in person at [facility] and explained the situation and that the patient and wife were concerned, but that his strength was actually quite good.     Last Vitals:  Vitals:   06/02/19 1630 06/02/19 1645  BP: 115/74 113/84  Pulse: 68 70  Resp: 13 16  Temp:  (!) 36.4 C  SpO2: 98% 98%    Last Pain:  Vitals:   06/02/19 1445  TempSrc:   PainSc: [place]

## 2019-06-02 NOTE — Progress Notes (Signed)
Pt up and ambulating in room with walker. States he still feels weak and numb in both legs, especially at top of thigh down to knee on right side. Will ambulate again in 1 hour with assistance.

## 2019-06-02 NOTE — Interval H&P Note (Signed)
History and Physical Interval Note:  06/02/2019 9:54 AM  Carl Mccullough  has presented today for surgery, with the diagnosis of BILATERAL INGUINAL NEW HERNIA UMBILICAL HERNIA.  The various methods of treatment have been discussed with the patient and family. After consideration of risks, benefits and other options for treatment, the patient has consented to  Procedure(s) with comments: [place] (N/A) - GENERAL WITH ERAS as a surgical intervention.  The patient's history has been reviewed, patient examined, no change in status, stable for surgery.  I have reviewed the patient's chart and labs.  Questions were answered to the patient's satisfaction.    I have re-reviewed the the patient's records, history, medications, and allergies.  I have re-examined the patient.  I again discussed intraoperative plans and goals of post-operative recovery.  The patient agrees to proceed.  LYAL SCULL  October 31, 1955 [id number]  Patient Care Team: Janora Norlander, DO as PCP - General (Family Medicine) Michael Boston, MD as Consulting Physician (General Surgery) Pyrtle, Lajuan Lines, MD as Consulting Physician (Gastroenterology) Irine Seal, MD as Attending Physician (Urology)  Patient Active Problem List   Diagnosis Date Noted   Personal history of colonic polyps 06/22/2018   Diverticulosis 06/22/2018   Benign prostatic hyperplasia without lower urinary tract symptoms 12/19/2016   Vitamin D deficiency 12/19/2016   Family history of bladder cancer 12/19/2016   Diverticulosis of colon without hemorrhage 01/03/2013   Erectile dysfunction 01/03/2013   Allergic rhinitis 01/03/2013   Hypertension 06/17/2012   Hyperlipidemia 06/17/2012   Testosterone deficiency 06/17/2012    Past Medical History:  Diagnosis Date   Adenomatous colon polyp    Benign prostatic hyperplasia    DDD (degenerative disc disease), lumbosacral    Diverticulitis     GERD (gastroesophageal reflux disease)    History of hypertension off bp meds since 2018 large weight loss   Hyperlipidemia    Inguinal hernia bilateral, non-recurrent    Internal hemorrhoids    Vitamin D deficiency     Past Surgical History:  Procedure Laterality Date   CYSTOSCOPY  [id number]   neck stitches  yrs ago   cut neck with chainsaw   TONSILLECTOMY      Social History   Socioeconomic History   Marital status: Married    Spouse name: Not on file   Number of children: Not on file   Years of education: Not on file   Highest education level: Not on file  Occupational History   Not on file  Tobacco Use   Smoking status: Never Smoker   Smokeless tobacco: Never Used  Substance and Sexual Activity   Alcohol use: Yes    Comment: occ   Drug use: No   Sexual activity: Yes  Other Topics Concern   Not on file  Social History Narrative   Not on file   Social Determinants of Health   Financial Resource Strain:    Difficulty of Paying Living Expenses:   Food Insecurity:    Worried About Charity fundraiser in the Last Year:    Arboriculturist in the Last Year:   Transportation Needs:    Film/video editor (Medical):    Lack of Transportation (Non-Medical):   Physical Activity:    Days of Exercise per Week:    Minutes of Exercise per Session:   Stress:    Feeling of Stress :   Social Connections:    Frequency of Communication with Friends  and Family:    Frequency of Social Gatherings with Friends and Family:    Attends Religious Services:    Active Member of Clubs or Organizations:    Attends Music therapist:    Marital Status:   Intimate Partner Violence:    Fear of Current or Ex-Partner:    Emotionally Abused:    Physically Abused:    Sexually Abused:     Family History  Problem Relation Age of Onset   Hypertension Mother    Alzheimer's disease Mother    Chronic Renal Failure Mother    Colon cancer Father    Bladder Cancer Father     Hypertension Brother    Rectal cancer Neg Hx    Stomach cancer Neg Hx    Esophageal cancer Neg Hx     Medications Prior to Admission  Medication Sig Dispense Refill Last Dose   cetirizine (ZYRTEC) 10 MG tablet Take 1 tablet (10 mg total) by mouth daily. 30 tablet 11 Past Week at Unknown time   fluticasone (FLONASE) 50 MCG/ACT nasal spray INSTILL 1-2 SPRAYS IN EACH NOSTRIL AT BEDTIME 48 g 3 06/01/2019 at Unknown time   Vitamin D, Ergocalciferol, (DRISDOL) 1.25 MG (50000 UT) CAPS capsule Take 1 capsule (50,000 Units total) by mouth every 7 (seven) days. (Patient taking differently: Take 50,000 Units by mouth every 7 (seven) days. friday) 12 capsule 3 Past Week at Unknown time   sildenafil (REVATIO) 20 MG tablet Take 2-5 tabs PRN prior to sexual activity. 50 tablet 11 More than a month at Unknown time    Current Facility-Administered Medications  Medication Dose Route Frequency Provider Last Rate Last Admin   bupivacaine liposome (EXPAREL) 1.3 % injection 266 mg  20 mL Infiltration Once Michael Boston, MD       ceFAZolin (ANCEF) IVPB 2g/100 mL premix  2 g Intravenous On Call to OR Michael Boston, MD       Chlorhexidine Gluconate Cloth 2 % PADS 6 each  6 each Topical Once Michael Boston, MD       And   Chlorhexidine Gluconate Cloth 2 % PADS 6 each  6 each Topical Once Michael Boston, MD       Derrill Memo ON 06/03/2019] feeding supplement (ENSURE PRE-SURGERY) liquid 296 mL  296 mL Oral Once Michael Boston, MD       lactated ringers infusion   Intravenous Continuous Myrtie Soman, MD         No Known Allergies  BP (!) 163/101   Pulse 68   Temp 98.2 F (36.8 C) (Oral)   Resp 15   Ht 5\' 10"  (1.778 m)   Wt 84.4 kg   SpO2 100%   BMI 26.70 kg/m   Labs: No results found for this or any previous visit (from the past 48 hour(s)).  Imaging / Studies: No results found.   Adin Hector, M.D., F.A.C.S. Gastrointestinal and Minimally Invasive Surgery [facility] [address] [place], [place] [address] [phone] Main / Paging  06/02/2019 9:54 AM    Adin Hector

## 2019-06-02 NOTE — Transfer of Care (Signed)
Immediate Anesthesia Transfer of Care Note  Patient: Carl Mccullough  Procedure(s) Performed: LAPAROSCOPIC BILATERAL INGUINAL HERNIA REPAIR, PRIMARY UMBILICAL HERNIA REPAIR (N/A Abdomen)  Patient Location: PACU  Anesthesia Type:General  Level of Consciousness: awake, alert  and oriented  Airway & Oxygen Therapy: Patient Spontanous Breathing and Patient connected to nasal cannula oxygen  Post-op Assessment: Report given to RN and Post -op Vital signs reviewed and stable  Post vital signs: Reviewed and stable  Last Vitals:  Vitals Value Taken Time  BP 127/101 06/02/19 1418  Temp 36.5 C 06/02/19 1415  Pulse 65 06/02/19 1423  Resp 13 06/02/19 1423  SpO2 98 % 06/02/19 1423  Vitals shown include unvalidated device data.  Last Pain:  Vitals:   06/02/19 0947  TempSrc: Oral  PainSc: 0-No pain      Patients Stated Pain Goal: 5 ([id number])  Complications: No apparent anesthesia complications

## 2019-06-02 NOTE — Progress Notes (Signed)
Concern by nursing that patient unable to bear weight.  Tap block was away from the femoral nerve, but will observe this evening and perhaps into the morning to make sure that he is more comfortable and ambulating.

## 2019-06-02 NOTE — Discharge Instructions (Signed)
Post Anesthesia Home Care Instructions  Activity: Get plenty of rest for the remainder of the day. A responsible individual must stay with you for 24 hours following the procedure.  For the next 24 hours, DO NOT: -Drive a car -Paediatric nurse -Drink alcoholic beverages -Take any medication unless instructed by your physician -Make any legal decisions or sign important papers.  Meals: Start with liquid foods such as gelatin or soup. Progress to regular foods as tolerated. Avoid greasy, spicy, heavy foods. If nausea and/or vomiting occur, drink only clear liquids until the nausea and/or vomiting subsides. Call your physician if vomiting continues.  Special Instructions/Symptoms: Your throat may feel dry or sore from the anesthesia or the breathing tube placed in your throat during surgery. If this causes discomfort, gargle with warm salt water. The discomfort should disappear within 24 hours.  If you had a scopolamine patch placed behind your ear for the management of post- operative nausea and/or vomiting:  1. The medication in the patch is effective for 72 hours, after which it should be removed.  Wrap patch in a tissue and discard in the trash. Wash hands thoroughly with soap and water. 2. You may remove the patch earlier than 72 hours if you experience unpleasant side effects which may include dry mouth, dizziness or visual disturbances. 3. Avoid touching the patch. Wash your hands with soap and water after contact with the patch.    HERNIA REPAIR: POST OP INSTRUCTIONS  ######################################################################  EAT Gradually transition to a high fiber diet with a fiber supplement over the next few weeks after discharge.  Start with a pureed / full liquid diet (see below)  WALK Walk an hour a day.  Control your pain to do that.    CONTROL PAIN Control pain so that you can walk, sleep, tolerate sneezing/coughing, and go up/down stairs.  HAVE A  BOWEL MOVEMENT DAILY Keep your bowels regular to avoid problems.  OK to try a laxative to override constipation.  OK to use an antidairrheal to slow down diarrhea.  Call if not better after 2 tries  CALL IF YOU HAVE PROBLEMS/CONCERNS Call if you are still struggling despite following these instructions. Call if you have concerns not answered by these instructions  ######################################################################    1. DIET: Follow a light bland diet & liquids the first 24 hours after arrival home, such as soup, liquids, starches, etc.  Be sure to drink plenty of fluids.  Quickly advance to a usual solid diet within a few days.  Avoid fast food or heavy meals as your are more likely to get nauseated or have irregular bowels.  A low-fat, high-fiber diet for the rest of your life is ideal.   2. Take your usually prescribed home medications unless otherwise directed.  3. PAIN CONTROL: a. Pain is best controlled by a usual combination of three different methods TOGETHER: i. Ice/Heat ii. Over the counter pain medication iii. Prescription pain medication b. Most patients will experience some swelling and bruising around the hernia(s) such as the bellybutton, groins, or old incisions.  Ice packs or heating pads (30-60 minutes up to 6 times a day) will help. Use ice for the first few days to help decrease swelling and bruising, then switch to heat to help relax tight/sore spots and speed recovery.  Some people prefer to use ice alone, heat alone, alternating between ice & heat.  Experiment to what works for you.  Swelling and bruising can take several weeks to resolve.   c.  It is helpful to take an over-the-counter pain medication regularly for the first few weeks.  Choose one of the following that works best for you: i. Naproxen (Aleve, etc)  Two 220mg  tabs twice a day ii. Ibuprofen (Advil, etc) Three 200mg  tabs four times a day (every meal & bedtime) iii. Acetaminophen  (Tylenol, etc) 325-650mg  four times a day (every meal & bedtime) d. A  prescription for pain medication should be given to you upon discharge.  Take your pain medication as prescribed.  i. If you are having problems/concerns with the prescription medicine (does not control pain, nausea, vomiting, rash, itching, etc), please call us [phone] to see if we need to switch you to a different pain medicine that will work better for you and/or control your side effect better. ii. If you need a refill on your pain medication, please contact your pharmacy.  They will contact our office to request authorization. Prescriptions will not be filled after 5 pm or on week-ends.  4. Avoid getting constipated.  Between the surgery and the pain medications, it is common to experience some constipation.  Increasing fluid intake and taking a fiber supplement (such as Metamucil, Citrucel, FiberCon, MiraLax, etc) 1-2 times a day regularly will usually help prevent this problem from occurring.  A mild laxative (prune juice, Milk of Magnesia, MiraLax, etc) should be taken according to package directions if there are no bowel movements after 48 hours.    5. Wash / shower every day.  You may shower over the dressings as they are waterproof.    6. Remove your waterproof bandages, skin tapes, and other bandages 5 days after surgery. You may replace a dressing/Band-Aid to cover the incision for comfort if you wish. You may leave the incisions open to air.  You may replace a dressing/Band-Aid to cover an incision for comfort if you wish.  Continue to shower over incision(s) after the dressing is off.  7. ACTIVITIES as tolerated:   a. You may resume regular (light) daily activities beginning the next day--such as daily self-care, walking, climbing stairs--gradually increasing activities as tolerated.  Control your pain so that you can walk an hour a day.  If you can walk 30 minutes without difficulty, it is safe to try more  intense activity such as jogging, treadmill, bicycling, low-impact aerobics, swimming, etc. b. Save the most intensive and strenuous activity for last such as sit-ups, heavy lifting, contact sports, etc  Refrain from any heavy lifting or straining until you are off narcotics for pain control.   c. DO NOT PUSH THROUGH PAIN.  Let pain be your guide: If it hurts to do something, don't do it.  Pain is your body warning you to avoid that activity for another week until the pain goes down. d. You may drive when you are no longer taking prescription pain medication, you can comfortably wear a seatbelt, and you can safely maneuver your car and apply brakes. e. Dennis Bast may have sexual intercourse when it is comfortable.   8. FOLLOW UP in our office a. Please call [facility] at [phone] to set up an appointment to see your surgeon in the office for a follow-up appointment approximately 2-3 weeks after your surgery. b. Make sure that you call for this appointment the day you arrive home to insure a convenient appointment time.  9.  If you have disability of FMLA / Family leave forms, please bring the forms to the office for processing.  (do not give to  your surgeon).  WHEN TO CALL us [phone]: 1. Poor pain control 2. Reactions / problems with new medications (rash/itching, nausea, etc)  3. Fever over 101.5 F (38.5 C) 4. Inability to urinate 5. Nausea and/or vomiting 6. Worsening swelling or bruising 7. Continued bleeding from incision. 8. Increased pain, redness, or drainage from the incision   The clinic staff is available to answer your questions during regular business hours (8:30am-5pm).  Please don't hesitate to call and ask to speak to one of our nurses for clinical concerns.   If you have a medical emergency, go to the nearest emergency room or call 911.  A surgeon from [facility] is always on call at the hospitals in [facility], [place], [place], [place], [place]  [address] ?  [address] [address], [place], [place]   [address] MAIN: [phone] ? TOLL FREE: [phone] ? FAX: [phone] [email or web address]  Information for Discharge Teaching: EXPAREL (bupivacaine liposome injectable suspension)   Your surgeon gave you EXPAREL(bupivacaine) in your surgical incision to help control your pain after surgery.   EXPAREL is a local anesthetic that provides pain relief by numbing the tissue around the surgical site.  EXPAREL is designed to release pain medication over time and can control pain for up to 72 hours.  Depending on how you respond to EXPAREL, you may require less pain medication during your recovery.  Possible side effects:  Temporary loss of sensation or ability to move in the area where bupivacaine was injected.  Nausea, vomiting, constipation  Rarely, numbness and tingling in your mouth or lips, lightheadedness, or anxiety may occur.  Call your doctor right away if you think you may be experiencing any of these sensations, or if you have other questions regarding possible side effects.  Follow all other discharge instructions given to you by your surgeon or nurse. Eat a healthy diet and drink plenty of water or other fluids.  If you return to the hospital for any reason within 96 hours following the administration of EXPAREL, please inform your health care providers.   Post Anesthesia Home Care Instructions  Activity: Get plenty of rest for the remainder of the day. A responsible adult should stay with you for 24 hours following the procedure.  For the next 24 hours, DO NOT: -Drive a car -Paediatric nurse -Drink alcoholic beverages -Take any medication unless instructed by your physician -Make any legal decisions or sign important papers.  Meals: Start with liquid foods such as gelatin or soup. Progress to regular foods as tolerated. Avoid greasy, spicy, heavy foods. If nausea and/or  vomiting occur, drink only clear liquids until the nausea and/or vomiting subsides. Call your physician if vomiting continues.  Special Instructions/Symptoms: Your throat may feel dry or sore from the anesthesia or the breathing tube placed in your throat during surgery. If this causes discomfort, gargle with warm salt water. The discomfort should disappear within 24 hours.  If you had a scopolamine patch placed behind your ear for the management of post- operative nausea and/or vomiting:  1. The medication in the patch is effective for 72 hours, after which it should be removed.  Wrap patch in a tissue and discard in the trash. Wash hands thoroughly with soap and water. 2. You may remove the patch earlier than 72 hours if you experience unpleasant side effects which may include dry mouth, dizziness or visual disturbances. 3. Avoid touching the patch. Wash your hands with soap  and water after contact with the patch.

## 2019-06-02 NOTE — Progress Notes (Signed)
Pt ambulated in hallway without walker with minimal assistance. Pts wife states that she has several walkers at home and will use those to assist until pt has recovered full strength to legs. All other discharge criteria met.

## 2019-06-02 NOTE — Progress Notes (Signed)
Attempted to transfer pt to phase 2 after PACU.  Pt unable to bear full weight.  "My legs feel weak". Pt unable to bear weight to transfer from stretcher to w/c,positioned at bedside.  2 RN's required to assist pt  To supine position on stretcher.  Pt 's skin clammy, cool. Pallor noted. Dr. Lissa Hoard paged and informed by M. Zenia Resides, RN

## 2019-08-15 ENCOUNTER — Other Ambulatory Visit: Payer: Self-pay | Admitting: Family Medicine

## 2019-08-16 NOTE — Telephone Encounter (Signed)
Refill for Doxycycline denied ntbs for consideration of rx of antibiotics

## 2019-09-12 ENCOUNTER — Other Ambulatory Visit: Payer: Self-pay | Admitting: Family Medicine

## 2019-09-12 DIAGNOSIS — J302 Other seasonal allergic rhinitis: Secondary | ICD-10-CM

## 2019-11-05 IMAGING — DX DG LUMBAR SPINE 2-3V
2 series · 2 of 2 positions shown · non-contrast
Comparison: None.

CLINICAL DATA: Low back pain

EXAM:
LUMBAR SPINE - 2-3 VIEW

[l-spine ap]
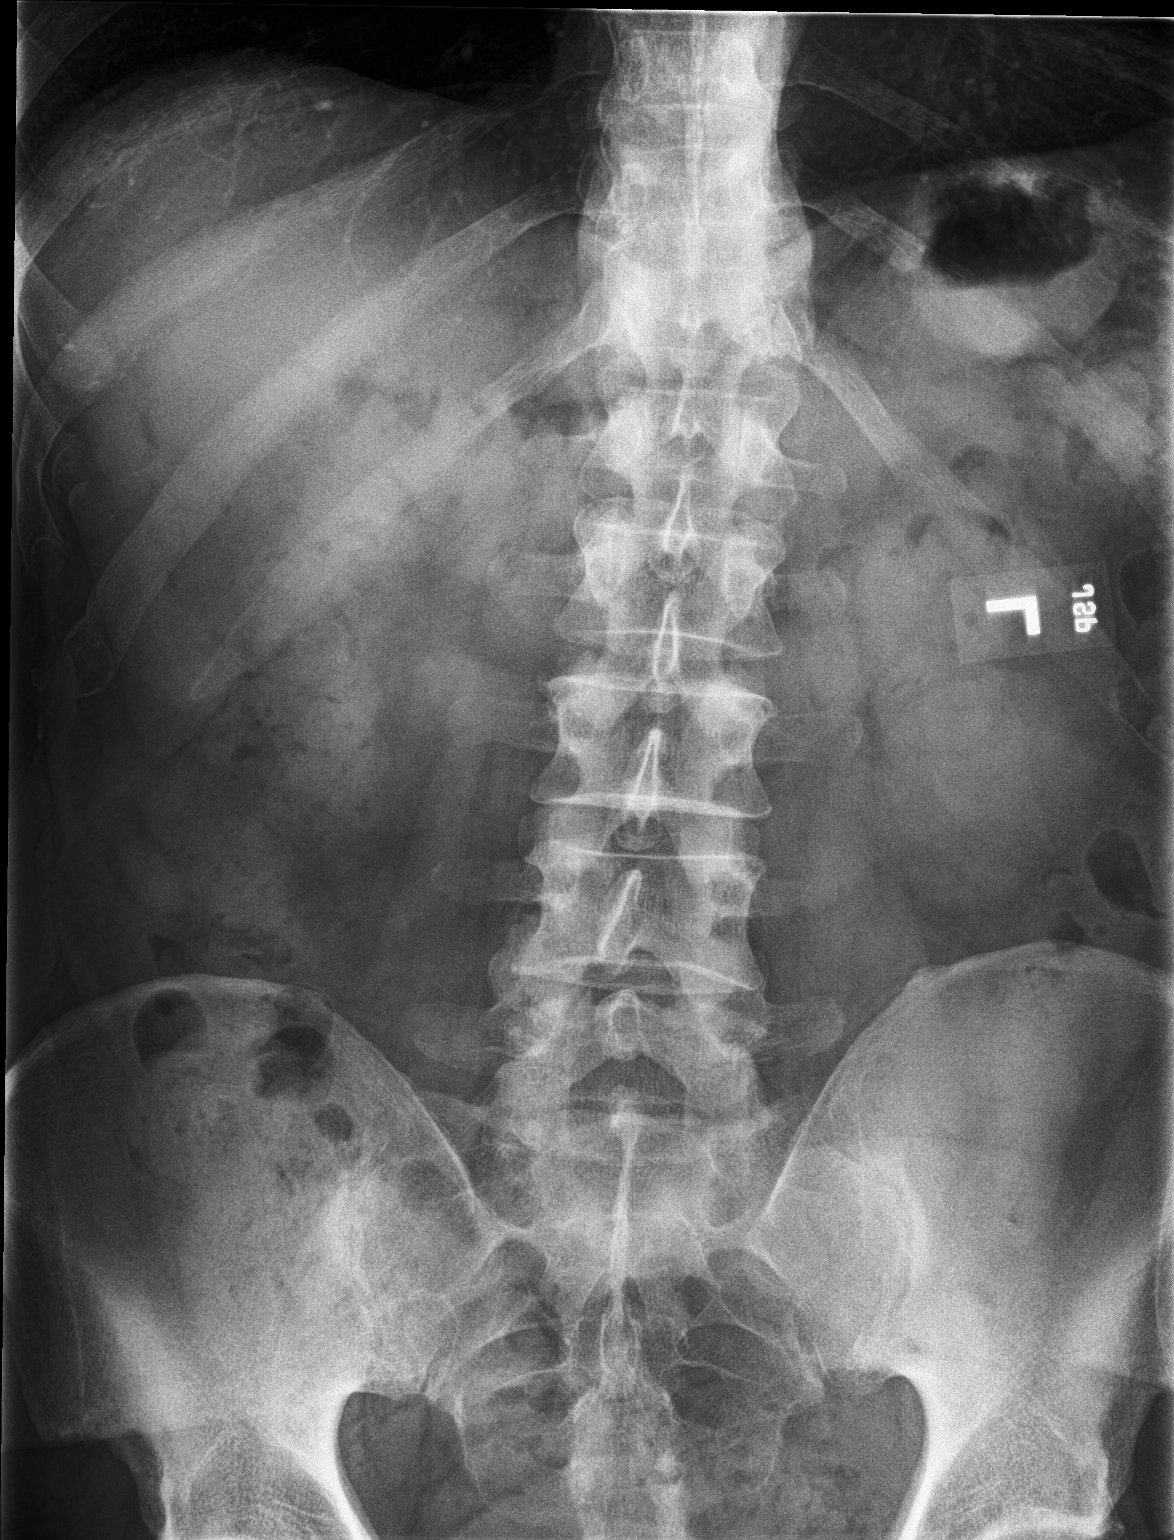

[l-spine lat]
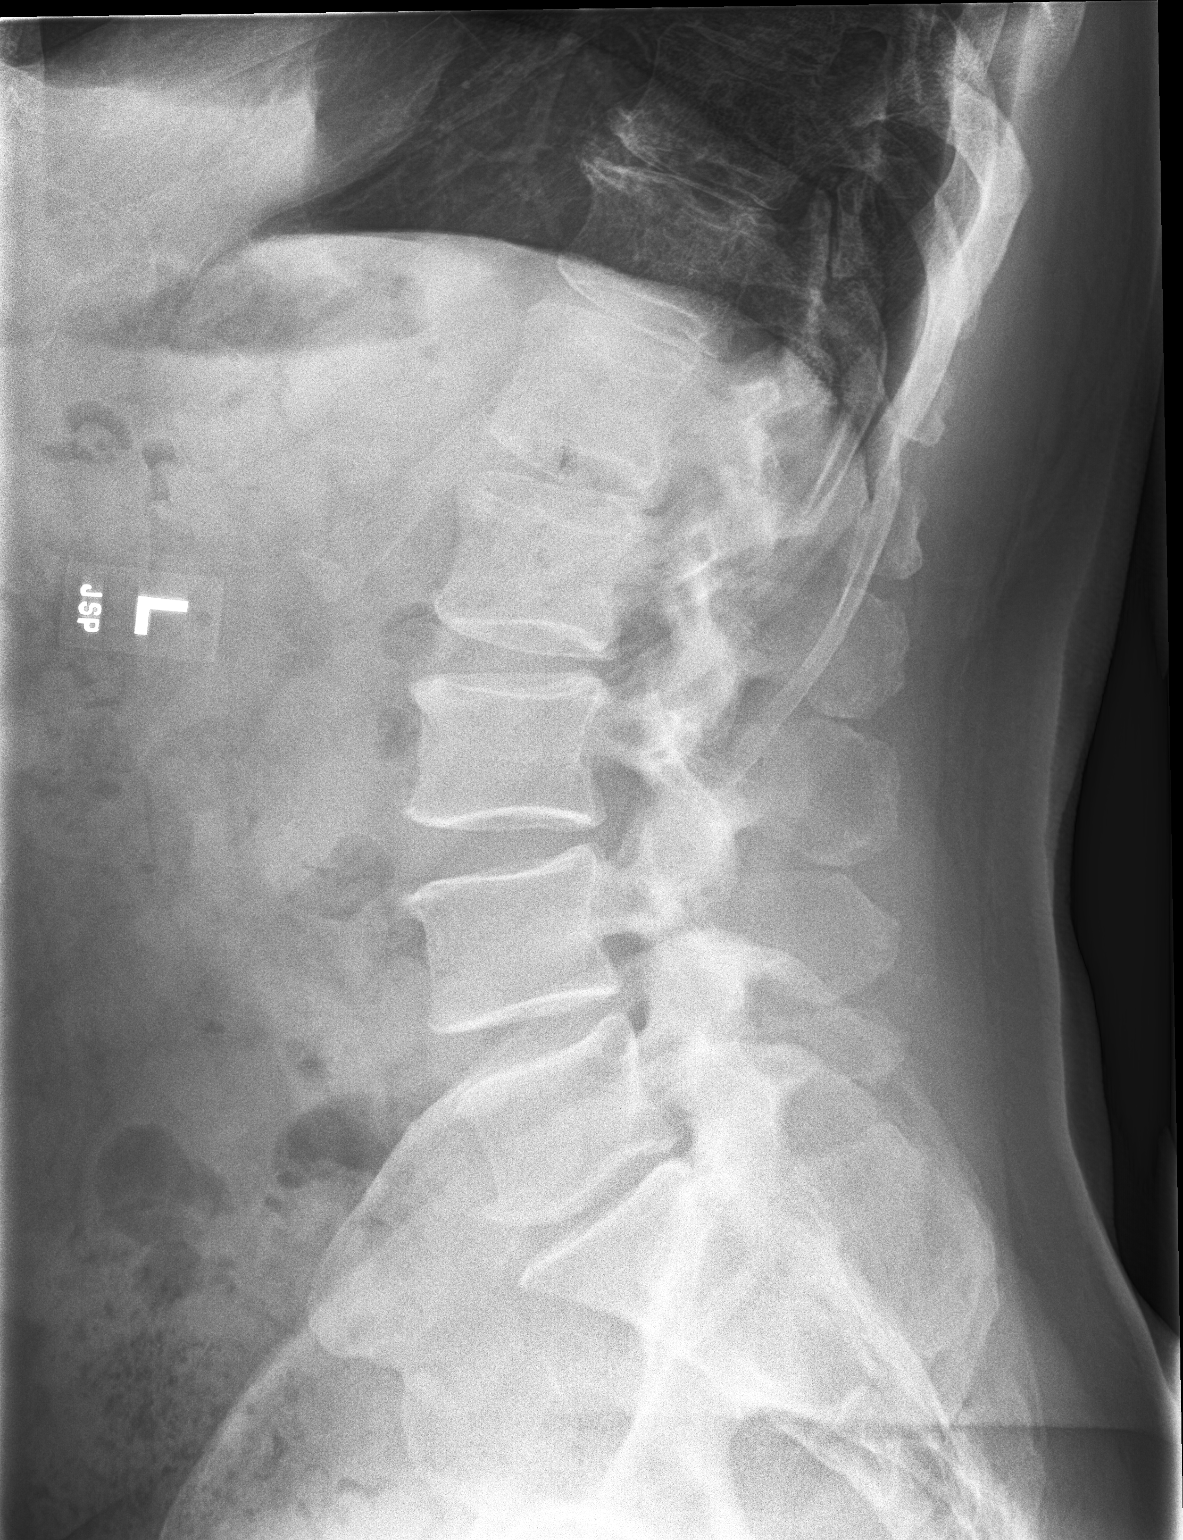

[2 of 2 positions shown; findings below may reference images not displayed]

FINDINGS: Loss of disc height at L5-S1 with mild anterior interbody spurring
at L5-S1. There is also anterior interbody spurring at T11-12.

No subluxation or fracture identified.

Sclerosis along the sacroiliac joints.
IMPRESSION: 1. Chronic bilateral sacroiliitis.
2. Degenerative disc disease at L5-S1. Mild anterior interbody
spurring at T11-12 and L5-S1.

## 2019-11-15 ENCOUNTER — Telehealth: Payer: Self-pay | Admitting: Family Medicine

## 2019-11-15 NOTE — Telephone Encounter (Signed)
Patient states that he was weeding last night and a piece of wire cut through pants and hit leg.  Patient states that he clean well warm water soap and peroxide.  Declines appt.  Patient would just like to know if he is up to date on tetanus.  Advised patient that last tetanus was 2017.  Advised patient if wound does not get any better to contact this office for an appt to be evaluated.  Patient verbalized understanding

## 2020-01-24 ENCOUNTER — Other Ambulatory Visit: Payer: Self-pay | Admitting: Family Medicine

## 2020-01-27 ENCOUNTER — Other Ambulatory Visit: Payer: Self-pay | Admitting: Family Medicine

## 2020-02-07 ENCOUNTER — Encounter: Payer: Self-pay | Admitting: Family Medicine

## 2020-02-07 ENCOUNTER — Ambulatory Visit (INDEPENDENT_AMBULATORY_CARE_PROVIDER_SITE_OTHER): Payer: BC Managed Care – PPO | Admitting: Family Medicine

## 2020-02-07 ENCOUNTER — Other Ambulatory Visit: Payer: Self-pay

## 2020-02-07 VITALS — BP 146/92 | HR 84 | Temp 98.4°F | Ht 70.0 in | Wt 188.6 lb

## 2020-02-07 DIAGNOSIS — Z23 Encounter for immunization: Secondary | ICD-10-CM

## 2020-02-07 DIAGNOSIS — I10 Essential (primary) hypertension: Secondary | ICD-10-CM

## 2020-02-07 DIAGNOSIS — E782 Mixed hyperlipidemia: Secondary | ICD-10-CM | POA: Diagnosis not present

## 2020-02-07 DIAGNOSIS — Z0000 Encounter for general adult medical examination without abnormal findings: Secondary | ICD-10-CM | POA: Diagnosis not present

## 2020-02-07 DIAGNOSIS — J3489 Other specified disorders of nose and nasal sinuses: Secondary | ICD-10-CM

## 2020-02-07 DIAGNOSIS — N400 Enlarged prostate without lower urinary tract symptoms: Secondary | ICD-10-CM

## 2020-02-07 DIAGNOSIS — N528 Other male erectile dysfunction: Secondary | ICD-10-CM

## 2020-02-07 MED ORDER — AZELASTINE HCL 0.1 % NA SOLN: 1.0000 | Freq: Two times a day (BID) | NASAL | 12 refills | Status: DC

## 2020-02-07 MED ORDER — TADALAFIL 20 MG PO TABS
10.0000 mg | ORAL_TABLET | ORAL | 11 refills | Status: DC | PRN
Start: 2020-02-07 — End: 2021-01-07

## 2020-02-07 NOTE — Patient Instructions (Addendum)
You had labs performed today.  You will be contacted with the results of the labs once they are available, usually in the next 3 business days for routine lab work.  If you have an active my chart account, they will be released to your MyChart.  If you prefer to have these labs released to you via telephone, please let us know.  If you had a pap smear or biopsy performed, expect to be contacted in about 7-10 days.   DASH Eating Plan DASH stands for "Dietary Approaches to Stop Hypertension." The DASH eating plan is a healthy eating plan that has been shown to reduce high blood pressure (hypertension). It may also reduce your risk for type 2 diabetes, heart disease, and stroke. The DASH eating plan may also help with weight loss. What are tips for following this plan?  General guidelines  Avoid eating more than 2,300 mg (milligrams) of salt (sodium) a day. If you have hypertension, you may need to reduce your sodium intake to 1,500 mg a day.  Limit alcohol intake to no more than 1 drink a day for nonpregnant women and 2 drinks a day for men. One drink equals 12 oz of beer, 5 oz of wine, or 1 oz of hard liquor.  Work with your health care provider to maintain a healthy body weight or to lose weight. Ask what an ideal weight is for you.  Get at least 30 minutes of exercise that causes your heart to beat faster (aerobic exercise) most days of the week. Activities may include walking, swimming, or biking.  Work with your health care provider or diet and nutrition specialist (dietitian) to adjust your eating plan to your individual calorie needs. Reading food labels   Check food labels for the amount of sodium per serving. Choose foods with less than 5 percent of the Daily Value of sodium. Generally, foods with less than 300 mg of sodium per serving fit into this eating plan.  To find whole grains, look for the word "whole" as the first word in the ingredient list. Shopping  Buy products  labeled as "low-sodium" or "no salt added."  Buy fresh foods. Avoid canned foods and premade or frozen meals. Cooking  Avoid adding salt when cooking. Use salt-free seasonings or herbs instead of table salt or sea salt. Check with your health care provider or pharmacist before using salt substitutes.  Do not fry foods. Cook foods using healthy methods such as baking, boiling, grilling, and broiling instead.  Cook with heart-healthy oils, such as olive, canola, soybean, or sunflower oil. Meal planning  Eat a balanced diet that includes: ? 5 or more servings of fruits and vegetables each day. At each meal, try to fill half of your plate with fruits and vegetables. ? Up to 6-8 servings of whole grains each day. ? Less than 6 oz of lean meat, poultry, or fish each day. A 3-oz serving of meat is about the same size as a deck of cards. One egg equals 1 oz. ? 2 servings of low-fat dairy each day. ? A serving of nuts, seeds, or beans 5 times each week. ? Heart-healthy fats. Healthy fats called Omega-3 fatty acids are found in foods such as flaxseeds and coldwater fish, like sardines, salmon, and mackerel.  Limit how much you eat of the following: ? Canned or prepackaged foods. ? Food that is high in trans fat, such as fried foods. ? Food that is high in saturated fat, such as fatty  meat. ? Sweets, desserts, sugary drinks, and other foods with added sugar. ? Full-fat dairy products.  Do not salt foods before eating.  Try to eat at least 2 vegetarian meals each week.  Eat more home-cooked food and less restaurant, buffet, and fast food.  When eating at a restaurant, ask that your food be prepared with less salt or no salt, if possible. What foods are recommended? The items listed may not be a complete list. Talk with your dietitian about what dietary choices are best for you. Grains Whole-grain or whole-wheat bread. Whole-grain or whole-wheat pasta. Brown rice. [place]. Bulgur.  Whole-grain and low-sodium cereals. Pita bread. Low-fat, low-sodium crackers. Whole-wheat flour tortillas. Vegetables Fresh or frozen vegetables (raw, steamed, roasted, or grilled). Low-sodium or reduced-sodium tomato and vegetable juice. Low-sodium or reduced-sodium tomato sauce and tomato paste. Low-sodium or reduced-sodium canned vegetables. Fruits All fresh, dried, or frozen fruit. Canned fruit in natural juice (without added sugar). Meat and other protein foods Skinless chicken or [place]. Ground chicken or [place]. Pork with fat trimmed off. Fish and seafood. Egg whites. Dried beans, peas, or lentils. Unsalted nuts, nut butters, and seeds. Unsalted canned beans. Lean cuts of beef with fat trimmed off. Low-sodium, lean deli meat. Dairy Low-fat (1%) or fat-free (skim) milk. Fat-free, low-fat, or reduced-fat cheeses. Nonfat, low-sodium ricotta or cottage cheese. Low-fat or nonfat yogurt. Low-fat, low-sodium cheese. Fats and oils Soft margarine without trans fats. Vegetable oil. Low-fat, reduced-fat, or light mayonnaise and salad dressings (reduced-sodium). Canola, safflower, olive, soybean, and sunflower oils. Avocado. Seasoning and other foods Herbs. Spices. Seasoning mixes without salt. Unsalted popcorn and pretzels. Fat-free sweets. What foods are not recommended? The items listed may not be a complete list. Talk with your dietitian about what dietary choices are best for you. Grains Baked goods made with fat, such as croissants, muffins, or some breads. Dry pasta or rice meal packs. Vegetables Creamed or fried vegetables. Vegetables in a cheese sauce. Regular canned vegetables (not low-sodium or reduced-sodium). Regular canned tomato sauce and paste (not low-sodium or reduced-sodium). Regular tomato and vegetable juice (not low-sodium or reduced-sodium). Angie Fava. Olives. Fruits Canned fruit in a light or heavy syrup. Fried fruit. Fruit in cream or butter sauce. Meat and other protein  foods Fatty cuts of meat. Ribs. Fried meat. Berniece Salines. Sausage. Bologna and other processed lunch meats. Salami. Fatback. Hotdogs. Bratwurst. Salted nuts and seeds. Canned beans with added salt. Canned or smoked fish. Whole eggs or egg yolks. Chicken or [place] with skin. Dairy Whole or 2% milk, cream, and half-and-half. Whole or full-fat cream cheese. Whole-fat or sweetened yogurt. Full-fat cheese. Nondairy creamers. Whipped toppings. Processed cheese and cheese spreads. Fats and oils Butter. Stick margarine. Lard. Shortening. Ghee. Bacon fat. Tropical oils, such as coconut, palm kernel, or palm oil. Seasoning and other foods Salted popcorn and pretzels. Onion salt, garlic salt, seasoned salt, table salt, and sea salt. Worcestershire sauce. Tartar sauce. Barbecue sauce. Teriyaki sauce. Soy sauce, including reduced-sodium. Steak sauce. Canned and packaged gravies. Fish sauce. Oyster sauce. Cocktail sauce. Horseradish that you find on the shelf. Ketchup. Mustard. Meat flavorings and tenderizers. Bouillon cubes. Hot sauce and Tabasco sauce. Premade or packaged marinades. Premade or packaged taco seasonings. Relishes. Regular salad dressings. Where to find more information:  National Heart, Lung, and [place]: https://wilson-eaton.com/  American Heart Association: www.heart.org Summary  The DASH eating plan is a healthy eating plan that has been shown to reduce high blood pressure (hypertension). It may also reduce your risk for type  2 diabetes, heart disease, and stroke.  With the DASH eating plan, you should limit salt (sodium) intake to 2,300 mg a day. If you have hypertension, you may need to reduce your sodium intake to 1,500 mg a day.  When on the DASH eating plan, aim to eat more fresh fruits and vegetables, whole grains, lean proteins, low-fat dairy, and heart-healthy fats.  Work with your health care provider or diet and nutrition specialist (dietitian) to adjust your eating plan to your  individual calorie needs. This information is not intended to replace advice given to you by your health care provider. Make sure you discuss any questions you have with your health care provider. Document Revised: 01/16/2017 Document Reviewed: 01/28/2016 Elsevier Patient Education  2020 [place] 65 Years and Older, Male Preventive care refers to lifestyle choices and visits with your health care provider that can promote health and wellness. This includes:  A yearly physical exam. This is also called an annual well check.  Regular dental and eye exams.  Immunizations.  Screening for certain conditions.  Healthy lifestyle choices, such as diet and exercise. What can I expect for my preventive care visit? Physical exam Your health care provider will check:  Height and weight. These may be used to calculate body mass index (BMI), which is a measurement that tells if you are at a healthy weight.  Heart rate and blood pressure.  Your skin for abnormal spots. Counseling Your health care provider may ask you questions about:  Alcohol, tobacco, and drug use.  Emotional well-being.  Home and relationship well-being.  Sexual activity.  Eating habits.  History of falls.  Memory and ability to understand (cognition).  Work and work Statistician. What immunizations do I need?  Influenza (flu) vaccine  This is recommended every year. Tetanus, diphtheria, and pertussis (Tdap) vaccine  You may need a Td booster every 10 years. Varicella (chickenpox) vaccine  You may need this vaccine if you have not already been vaccinated. Zoster (shingles) vaccine  You may need this after age 47. Pneumococcal conjugate (PCV13) vaccine  One dose is recommended after age 61. Pneumococcal polysaccharide (PPSV23) vaccine  One dose is recommended after age 55. Measles, mumps, and rubella (MMR) vaccine  You may need at least one dose of MMR if you were born in 1957  or later. You may also need a second dose. Meningococcal conjugate (MenACWY) vaccine  You may need this if you have certain conditions. Hepatitis A vaccine  You may need this if you have certain conditions or if you travel or work in places where you may be exposed to hepatitis A. Hepatitis B vaccine  You may need this if you have certain conditions or if you travel or work in places where you may be exposed to hepatitis B. Haemophilus influenzae type b (Hib) vaccine  You may need this if you have certain conditions. You may receive vaccines as individual doses or as more than one vaccine together in one shot (combination vaccines). Talk with your health care provider about the risks and benefits of combination vaccines. What tests do I need? Blood tests  Lipid and cholesterol levels. These may be checked every 5 years, or more frequently depending on your overall health.  Hepatitis C test.  Hepatitis B test. Screening  Lung cancer screening. You may have this screening every year starting at age 76 if you have a 30-pack-year history of smoking and currently smoke or have quit within the past 15  years.  Colorectal cancer screening. All adults should have this screening starting at age 11 and continuing until age 48. Your health care provider may recommend screening at age 34 if you are at increased risk. You will have tests every 1-10 years, depending on your results and the type of screening test.  Prostate cancer screening. Recommendations will vary depending on your family history and other risks.  Diabetes screening. This is done by checking your blood sugar (glucose) after you have not eaten for a while (fasting). You may have this done every 1-3 years.  Abdominal aortic aneurysm (AAA) screening. You may need this if you are a current or former smoker.  Sexually transmitted disease (STD) testing. Follow these instructions at home: Eating and drinking  Eat a diet that  includes fresh fruits and vegetables, whole grains, lean protein, and low-fat dairy products. Limit your intake of foods with high amounts of sugar, saturated fats, and salt.  Take vitamin and mineral supplements as recommended by your health care provider.  Do not drink alcohol if your health care provider tells you not to drink.  If you drink alcohol: ? Limit how much you have to 0-2 drinks a day. ? Be aware of how much alcohol is in your drink. In the [place] one drink equals one 12 oz bottle of beer (355 mL), one 5 oz glass of wine (148 mL), or one 1 oz glass of hard liquor (44 mL). Lifestyle  Take daily care of your teeth and gums.  Stay active. Exercise for at least 30 minutes on 5 or more days each week.  Do not use any products that contain nicotine or tobacco, such as cigarettes, e-cigarettes, and chewing tobacco. If you need help quitting, ask your health care provider.  If you are sexually active, practice safe sex. Use a condom or other form of protection to prevent STIs (sexually transmitted infections).  Talk with your health care provider about taking a low-dose aspirin or statin. What's next?  Visit your health care provider once a year for a well check visit.  Ask your health care provider how often you should have your eyes and teeth checked.  Stay up to date on all vaccines. This information is not intended to replace advice given to you by your health care provider. Make sure you discuss any questions you have with your health care provider. Document Revised: 01/28/2018 Document Reviewed: 01/28/2018 Elsevier Patient Education  2020 [employer].

## 2020-02-07 NOTE — Progress Notes (Signed)
Carl Mccullough is a 64 y.o. male presents to office today for annual physical exam examination.    Concerns today include: 1.  Erectile dysfunction Patient reports ability to obtain an erection but inability to maintain.  He was previously treated with generic Viagra but notes that this caused him quite a bit of a headache.  He would be willing to try something else  2.  Rhinorrhea Patient with rhinorrhea but seems to be more prominent when he leans over.  He denies any nasal congestion.  Was previously using Flonase and Zyrtec did not find them helpful.  3.  Hearing difficulty Patient is now wearing hearing aids and feels that these are working well for him.  Marital status: married, Substance use: none Diet: fair.  Admits that he eats out frequently and could be better, Exercise: Not as physically active as he had been Last eye exam: Up-to-date Last dental exam: Up-to-date Last colonoscopy: Up-to-date Refills needed today:  Immunizations needed: Pneumonia vaccine but had a rash on the one arm where he had the pneumonia vaccine 1 year ago.  Past Medical History:  Diagnosis Date  . Adenomatous colon polyp   . Benign prostatic hyperplasia   . DDD (degenerative disc disease), lumbosacral   . Diverticulitis   . GERD (gastroesophageal reflux disease)   . History of hypertension off bp meds since 2018 large weight loss  . Hyperlipidemia   . Inguinal hernia bilateral, non-recurrent   . Internal hemorrhoids   . Vitamin D deficiency    Social History   Socioeconomic History  . Marital status: Married    Spouse name: Not on file  . Number of children: Not on file  . Years of education: Not on file  . Highest education level: Not on file  Occupational History  . Not on file  Tobacco Use  . Smoking status: Never Smoker  . Smokeless tobacco: Never Used  Vaping Use  . Vaping Use: Never used  Substance and Sexual Activity  . Alcohol use: Yes    Comment: occ  . Drug use: No   . Sexual activity: Yes  Other Topics Concern  . Not on file  Social History Narrative  . Not on file   Social Determinants of Health   Financial Resource Strain: Not on file  Food Insecurity: Not on file  Transportation Needs: Not on file  Physical Activity: Not on file  Stress: Not on file  Social Connections: Not on file  Intimate Partner Violence: Not on file   Past Surgical History:  Procedure Laterality Date  . CYSTOSCOPY  [id number]  . INGUINAL HERNIA REPAIR N/A 06/02/2019   Procedure: LAPAROSCOPIC BILATERAL INGUINAL HERNIA REPAIR, PRIMARY UMBILICAL HERNIA REPAIR;  Surgeon: Michael Boston, MD;  Location: [facility];  Service: General;  Laterality: N/A;  GENERAL WITH ERAS  . neck stitches  yrs ago   cut neck with chainsaw  . TONSILLECTOMY     Family History  Problem Relation Age of Onset  . Hypertension Mother   . Alzheimer's disease Mother   . Chronic Renal Failure Mother   . Colon cancer Father   . Bladder Cancer Father   . Hypertension Brother   . Rectal cancer Neg Hx   . Stomach cancer Neg Hx   . Esophageal cancer Neg Hx     Current Outpatient Medications:  .  cetirizine (ZYRTEC) 10 MG tablet, Take 1 tablet (10 mg total) by mouth daily., Disp: 30 tablet, Rfl: 11 .  fluticasone (FLONASE) 50 MCG/ACT nasal spray, INSTILL 1-2 SPRAYS IN EACH NOSTRIL AT BEDTIME, Disp: 48 mL, Rfl: 1 .  sildenafil (REVATIO) 20 MG tablet, Take 2-5 tabs PRN prior to sexual activity., Disp: 50 tablet, Rfl: 11 .  traMADol (ULTRAM) 50 MG tablet, Take 1-2 tablets (50-100 mg total) by mouth every 6 (six) hours as needed for moderate pain or severe pain., Disp: 30 tablet, Rfl: 0 .  Vitamin D, Ergocalciferol, (DRISDOL) 1.25 MG (50000 UT) CAPS capsule, Take 1 capsule (50,000 Units total) by mouth every 7 (seven) days. (Patient taking differently: Take 50,000 Units by mouth every 7 (seven) days. friday), Disp: 12 capsule, Rfl: 3  No Known Allergies   ROS: Review of  Systems Pertinent items noted in HPI and remainder of comprehensive ROS otherwise negative.    Physical exam BP (!) 146/92   Pulse 84   Temp 98.4 F (36.9 C)   Ht 5' 10" (1.778 m)   Wt 188 lb 9.6 oz (85.5 kg)   SpO2 99%   BMI 27.06 kg/m  General appearance: alert, cooperative, appears stated age and no distress Head: Normocephalic, without obvious abnormality, atraumatic Eyes: negative findings: lids and lashes normal, conjunctivae and sclerae normal, corneas clear and pupils equal, round, reactive to light and accomodation Ears: normal TM's and external ear canals both ears Nose: Nares normal. Septum midline. Mucosa normal. No drainage or sinus tenderness. Throat: lips, mucosa, and tongue normal; teeth and gums normal Neck: no adenopathy, no carotid bruit, supple, symmetrical, trachea midline and thyroid not enlarged, symmetric, no tenderness/mass/nodules Back: symmetric, no curvature. ROM normal. No CVA tenderness. Lungs: clear to auscultation bilaterally Chest wall: no tenderness Heart: regular rate and rhythm, S1, S2 normal, no murmur, click, rub or gallop Abdomen: soft, non-tender; bowel sounds normal; no masses,  no organomegaly Extremities: extremities normal, atraumatic, no cyanosis or edema Pulses: 2+ and symmetric Skin: Mildly erythematous, blistered lesion noted along the left posterior shoulder Lymph nodes: Cervical, supraclavicular, and axillary nodes normal. Neurologic: Alert and oriented X 3, normal strength and tone. Normal symmetric reflexes. Normal coordination and gait    Assessment/ Plan: Carl Mccullough here for annual physical exam.   Annual physical exam  Essential hypertension - Plan: CMP14+EGFR, CMP14+EGFR  Mixed hyperlipidemia - Plan: CMP14+EGFR, Lipid panel, TSH, TSH, Lipid panel, CMP14+EGFR  Benign prostatic hyperplasia without lower urinary tract symptoms - Plan: PSA, PSA  Need for immunization against influenza - Plan: Flu Vaccine QUAD High  Dose(Fluad)  Rhinorrhea - Plan: azelastine (ASTELIN) 0.1 % nasal spray  Other male erectile dysfunction - Plan: tadalafil (CIALIS) 20 MG tablet  He fasting labs were obtained today.  Trial Cialis Astelin prescribed for nasal rhinorrhea  Blood pressure is not at goal.  Previously treated medicines but he had done well with lifestyle modification was able to come off.  He was to try lifestyle modification again before pursuing any meds.  He will follow-up in 3 months for blood pressure recheck.  If still above goal, plan to initiate Norvasc 5 mg daily.  Counseled on healthy lifestyle choices, including diet (rich in fruits, vegetables and lean meats and low in salt and simple carbohydrates) and exercise (at least 30 minutes of moderate physical activity daily).  Patient to follow up in 1 year for annual exam or sooner if needed.  Ashly M. Lajuana Ripple, DO

## 2020-02-08 ENCOUNTER — Other Ambulatory Visit: Payer: Self-pay | Admitting: Family Medicine

## 2020-02-08 LAB — CMP14+EGFR
ALT: 25 IU/L (ref 0–44)
AST: 22 IU/L (ref 0–40)
Albumin/Globulin Ratio: 1.6 (ref 1.2–2.2)
Albumin: 4.6 g/dL (ref 3.8–4.8)
Alkaline Phosphatase: 117 IU/L (ref 44–121)
BUN/Creatinine Ratio: 12 (ref 10–24)
BUN: 11 mg/dL (ref 8–27)
Bilirubin Total: 0.3 mg/dL (ref 0.0–1.2)
CO2: 25 mmol/L (ref 20–29)
Calcium: 10.4 mg/dL — ABNORMAL HIGH (ref 8.6–10.2)
Chloride: 98 mmol/L (ref 96–106)
Creatinine, Ser: 0.92 mg/dL (ref 0.76–1.27)
GFR calc Af Amer: 100 mL/min/{1.73_m2} (ref >59)
GFR calc non Af Amer: 86 mL/min/{1.73_m2} (ref >59)
Globulin, Total: 2.8 g/dL (ref 1.5–4.5)
Glucose: 85 mg/dL (ref 65–99)
Potassium: 5.4 mmol/L — ABNORMAL HIGH (ref 3.5–5.2)
Sodium: 135 mmol/L (ref 134–144)
Total Protein: 7.4 g/dL (ref 6.0–8.5)

## 2020-02-08 LAB — LIPID PANEL
Chol/HDL Ratio: 2.5 ratio (ref 0.0–5.0)
Cholesterol, Total: 178 mg/dL (ref 100–199)
HDL: 72 mg/dL (ref >39)
LDL Chol Calc (NIH): 95 mg/dL (ref 0–99)
Triglycerides: 56 mg/dL (ref 0–149)
VLDL Cholesterol Cal: 11 mg/dL (ref 5–40)

## 2020-02-08 LAB — PSA: Prostate Specific Ag, Serum: 1.2 ng/mL (ref 0.0–4.0)

## 2020-02-08 LAB — TSH: TSH: 3.32 u[IU]/mL (ref 0.450–4.500)

## 2020-02-08 MED ORDER — VITAMIN D (ERGOCALCIFEROL) 1.25 MG (50000 UNIT) PO CAPS: 50000.0000 [IU] | ORAL_CAPSULE | ORAL | 3 refills | Status: DC

## 2020-02-14 ENCOUNTER — Other Ambulatory Visit: Payer: Self-pay

## 2020-02-14 ENCOUNTER — Ambulatory Visit (INDEPENDENT_AMBULATORY_CARE_PROVIDER_SITE_OTHER): Payer: BC Managed Care – PPO | Admitting: Family

## 2020-02-14 ENCOUNTER — Ambulatory Visit (INDEPENDENT_AMBULATORY_CARE_PROVIDER_SITE_OTHER): Payer: BC Managed Care – PPO

## 2020-02-14 ENCOUNTER — Encounter: Payer: Self-pay | Admitting: Family

## 2020-02-14 VITALS — BP 158/91 | HR 71 | Temp 98.2°F | Ht 70.0 in | Wt 191.0 lb

## 2020-02-14 DIAGNOSIS — M25511 Pain in right shoulder: Secondary | ICD-10-CM

## 2020-02-14 DIAGNOSIS — J069 Acute upper respiratory infection, unspecified: Secondary | ICD-10-CM

## 2020-02-14 DIAGNOSIS — G8929 Other chronic pain: Secondary | ICD-10-CM | POA: Diagnosis not present

## 2020-02-14 MED ORDER — FLUTICASONE PROPIONATE 50 MCG/ACT NA SUSP: 2.0000 | Freq: Every day | NASAL | 6 refills | Status: DC

## 2020-02-14 MED ORDER — MELOXICAM 15 MG PO TABS: 15.0000 mg | ORAL_TABLET | Freq: Every day | ORAL | 1 refills | Status: DC

## 2020-02-14 MED ORDER — CETIRIZINE HCL 10 MG PO TABS: 10.0000 mg | ORAL_TABLET | Freq: Every day | ORAL | 11 refills | Status: DC

## 2020-02-14 NOTE — Patient Instructions (Signed)
Shoulder Pain Many things can cause shoulder pain, including:  An injury to the shoulder.  Overuse of the shoulder.  Arthritis. The source of the pain can be:  Inflammation.  An injury to the shoulder joint.  An injury to a tendon, ligament, or bone. Follow these instructions at home: Pay attention to changes in your symptoms. Let your health care provider know about them. Follow these instructions to relieve your pain. If you have a sling:  Wear the sling as told by your health care provider. Remove it only as told by your health care provider.  Loosen the sling if your fingers tingle, become numb, or turn cold and blue.  Keep the sling clean.  If the sling is not waterproof: ? Do not let it get wet. Remove it to shower or bathe.  Move your arm as little as possible, but keep your hand moving to prevent swelling. Managing pain, stiffness, and swelling   If directed, put ice on the painful area: ? Put ice in a plastic bag. ? Place a towel between your skin and the bag. ? Leave the ice on for 20 minutes, 2-3 times per day. Stop applying ice if it does not help with the pain.  Squeeze a soft ball or a foam pad as much as possible. This helps to keep the shoulder from swelling. It also helps to strengthen the arm. General instructions  Take over-the-counter and prescription medicines only as told by your health care provider.  Keep all follow-up visits as told by your health care provider. This is important. Contact a health care provider if:  Your pain gets worse.  Your pain is not relieved with medicines.  New pain develops in your arm, hand, or fingers. Get help right away if:  Your arm, hand, or fingers: ? Tingle. ? Become numb. ? Become swollen. ? Become painful. ? Turn white or blue. Summary  Shoulder pain can be caused by an injury, overuse, or arthritis.  Pay attention to changes in your symptoms. Let your health care provider know about  them.  This condition may be treated with a sling, ice, and pain medicines.  Contact your health care provider if the pain gets worse or new pain develops. Get help right away if your arm, hand, or fingers tingle or become numb, swollen, or painful.  Keep all follow-up visits as told by your health care provider. This is important. This information is not intended to replace advice given to you by your health care provider. Make sure you discuss any questions you have with your health care provider. Document Revised: 08/18/2017 Document Reviewed: 08/18/2017 Elsevier Patient Education  2020 [employer]  

## 2020-02-14 NOTE — Progress Notes (Signed)
Subjective:    Patient ID: Carl Mccullough, male    DOB: 09/23/1955, 64 y.o.   MRN: [id number]  Chief Complaint  Patient presents with  . Shoulder Pain    Right shoulder comes and goes. Been going on for over a year   . Ear Fullness    Started popping in ears 3-4 days ago     Shoulder Pain  The pain is present in the right shoulder. This is a new problem. The current episode started more than 1 year ago. There has been no history of extremity trauma. The problem occurs intermittently. The problem has been waxing and waning. The quality of the pain is described as aching. The pain is at a severity of 3/10. The pain is moderate. Pertinent negatives include no numbness or stiffness. He has tried NSAIDS for the symptoms. The treatment provided mild relief.  Ear Fullness  There is pain in the left ear. This is a new problem. The current episode started in the past 7 days. The problem has been waxing and waning. The pain is at a severity of 0/10. The patient is experiencing no pain. Pertinent negatives include no coughing, headaches, hearing loss, rhinorrhea or sore throat. He has tried nothing for the symptoms. The treatment provided no relief.      Review of Systems  HENT: Negative for hearing loss, rhinorrhea and sore throat.   Respiratory: Negative for cough.   Musculoskeletal: Negative for stiffness.  Neurological: Negative for numbness and headaches.  All other systems reviewed and are negative.      Objective:   Physical Exam Vitals reviewed.  Constitutional:      General: He is not in acute distress.    Appearance: He is well-developed and well-nourished.  HENT:     Head: Normocephalic.     Right Ear: Tympanic membrane normal.     Left Ear: Tympanic membrane normal.     Mouth/Throat:     Mouth: Oropharynx is clear and moist.  Eyes:     General:        Right eye: No discharge.        Left eye: No discharge.     Pupils: Pupils are equal, round, and reactive to light.   Neck:     Thyroid: No thyromegaly.  Cardiovascular:     Rate and Rhythm: Normal rate and regular rhythm.     Pulses: Intact distal pulses.     Heart sounds: Normal heart sounds. No murmur heard.   Pulmonary:     Effort: Pulmonary effort is normal. No respiratory distress.     Breath sounds: Normal breath sounds. No wheezing.  Abdominal:     General: Bowel sounds are normal. There is no distension.     Palpations: Abdomen is soft.     Tenderness: There is no abdominal tenderness.  Musculoskeletal:        General: No tenderness or edema. Normal range of motion.     Cervical back: Normal range of motion and neck supple.     Comments: Full ROM, slight anterior pain with abduction   Skin:    General: Skin is warm and dry.     Findings: No erythema or rash.  Neurological:     Mental Status: He is alert and oriented to person, place, and time.     Cranial Nerves: No cranial nerve deficit.     Deep Tendon Reflexes: Reflexes are normal and symmetric.  Psychiatric:  Mood and Affect: Mood and affect normal.        Behavior: Behavior normal.        Thought Content: Thought content normal.        Judgment: Judgment normal.       BP (!) 158/91   Pulse 71   Temp 98.2 F (36.8 C) (Temporal)   Ht 5\' 10"  (1.778 m)   Wt 191 lb (86.6 kg)   BMI 27.41 kg/m      Assessment & Plan:  Carl Mccullough comes in today with chief complaint of Shoulder Pain (Right shoulder comes and goes. Been going on for over a year ) and Ear Fullness (Started popping in ears 3-4 days ago )   Diagnosis and orders addressed:  1. Viral URI - Take meds as prescribed - Use a cool mist humidifier  -Use saline nose sprays frequently -Force fluids -For any cough or congestion  Use plain Mucinex- regular strength or max strength is fine -For fever or aces or pains- take tylenol or ibuprofen. -Throat lozenges if help -RTO if symptoms worsen or do not improve  - fluticasone (FLONASE) 50 MCG/ACT nasal  spray; Place 2 sprays into both nostrils daily.  Dispense: 16 g; Refill: 6 - cetirizine (ZYRTEC) 10 MG tablet; Take 1 tablet (10 mg total) by mouth daily.  Dispense: 30 tablet; Refill: 11  2. Chronic right shoulder pain Start mobic  No other NSAID's - meloxicam (MOBIC) 15 MG tablet; Take 1 tablet (15 mg total) by mouth daily.  Dispense: 90 tablet; Refill: 1 - DG Shoulder Right; Future   Evelina Dun, FNP

## 2020-02-19 ENCOUNTER — Encounter (HOSPITAL_COMMUNITY): Payer: Self-pay | Admitting: Emergency Medicine

## 2020-02-19 ENCOUNTER — Other Ambulatory Visit: Payer: Self-pay

## 2020-02-19 ENCOUNTER — Emergency Department (HOSPITAL_COMMUNITY)
Admission: EM | Admit: 2020-02-19 | Discharge: 2020-02-20 | Disposition: A | Discharge status: Home / Self Care | Payer: 59 | Attending: Emergency Medicine | Admitting: Emergency Medicine

## 2020-02-19 DIAGNOSIS — K219 Gastro-esophageal reflux disease without esophagitis: Secondary | ICD-10-CM | POA: Diagnosis not present

## 2020-02-19 DIAGNOSIS — N4829 Other inflammatory disorders of penis: Secondary | ICD-10-CM | POA: Insufficient documentation

## 2020-02-19 DIAGNOSIS — M545 Low back pain, unspecified: Secondary | ICD-10-CM | POA: Insufficient documentation

## 2020-02-19 DIAGNOSIS — R1030 Lower abdominal pain, unspecified: Secondary | ICD-10-CM | POA: Diagnosis present

## 2020-02-19 DIAGNOSIS — I10 Essential (primary) hypertension: Secondary | ICD-10-CM | POA: Diagnosis not present

## 2020-02-19 DIAGNOSIS — K5732 Diverticulitis of large intestine without perforation or abscess without bleeding: Secondary | ICD-10-CM | POA: Diagnosis not present

## 2020-02-19 DIAGNOSIS — K5792 Diverticulitis of intestine, part unspecified, without perforation or abscess without bleeding: Secondary | ICD-10-CM

## 2020-02-19 LAB — URINALYSIS, ROUTINE W REFLEX MICROSCOPIC
Bilirubin Urine: NEGATIVE
Glucose, UA: NEGATIVE mg/dL
Hgb urine dipstick: NEGATIVE
Ketones, ur: 80 mg/dL — AB
Leukocytes,Ua: NEGATIVE
Nitrite: NEGATIVE
Protein, ur: NEGATIVE mg/dL
Specific Gravity, Urine: 1.019 (ref 1.005–1.030)
pH: 5 (ref 5.0–8.0)

## 2020-02-19 LAB — CBC
HCT: 47.5 % (ref 39.0–52.0)
Hemoglobin: 15.8 g/dL (ref 13.0–17.0)
MCH: 32.3 pg (ref 26.0–34.0)
MCHC: 33.3 g/dL (ref 30.0–36.0)
MCV: 97.1 fL (ref 80.0–100.0)
Platelets: 241 10*3/uL (ref 150–400)
RBC: 4.89 MIL/uL (ref 4.22–5.81)
RDW: 12.2 % (ref 11.5–15.5)
WBC: 14.5 10*3/uL — ABNORMAL HIGH (ref 4.0–10.5)
nRBC: 0 % (ref 0.0–0.2)

## 2020-02-19 LAB — COMPREHENSIVE METABOLIC PANEL
ALT: 23 U/L (ref 0–44)
AST: 19 U/L (ref 15–41)
Albumin: 3.8 g/dL (ref 3.5–5.0)
Alkaline Phosphatase: 84 U/L (ref 38–126)
Anion gap: 10 (ref 5–15)
BUN: 11 mg/dL (ref 8–23)
CO2: 22 mmol/L (ref 22–32)
Calcium: 9.2 mg/dL (ref 8.9–10.3)
Chloride: 104 mmol/L (ref 98–111)
Creatinine, Ser: 1.14 mg/dL (ref 0.61–1.24)
GFR, Estimated: >60 mL/min (ref >60)
Glucose, Bld: 98 mg/dL (ref 70–99)
Potassium: 4.8 mmol/L (ref 3.5–5.1)
Sodium: 136 mmol/L (ref 135–145)
Total Bilirubin: 1.2 mg/dL (ref 0.3–1.2)
Total Protein: 7.4 g/dL (ref 6.5–8.1)

## 2020-02-19 LAB — LIPASE, BLOOD: Lipase: 32 U/L (ref 11–51)

## 2020-02-19 NOTE — ED Triage Notes (Signed)
C/o lower abd pain since yesterday.  Denies nausea, vomiting, and diarrhea.  States pain decreases when he stands up.  Hx of diverticulitis.

## 2020-02-20 ENCOUNTER — Emergency Department (HOSPITAL_COMMUNITY): Payer: 59

## 2020-02-20 MED ORDER — METRONIDAZOLE 500 MG PO TABS: 500.0000 mg | ORAL_TABLET | Freq: Two times a day (BID) | ORAL | 0 refills | Status: DC

## 2020-02-20 MED ORDER — CIPROFLOXACIN HCL 500 MG PO TABS: 500.0000 mg | ORAL_TABLET | Freq: Two times a day (BID) | ORAL | 0 refills | Status: DC

## 2020-02-20 NOTE — ED Provider Notes (Signed)
[facility] EMERGENCY DEPARTMENT Provider Note   CSN: [id number] Arrival date & time: 02/19/20  1431     History Chief Complaint  Patient presents with  . Abdominal Pain    Carl Mccullough is a 65 y.o. male.  Patient is a 65 year old male with a history of diverticulitis, GERD, prior inguinal hernia repair who presents with abdominal pain.  He said that yesterday he had a sharp pain in his lower abdomen.  He said the pain went through his penis and he felt burning in his penis.  He says is penile pain is resolved but he still has some discomfort to his lower abdomen.  He denies any nausea or vomiting.  No change in bowels.  No known fevers.  No burning when he urinates or difficulty urinating.  His it feels a bit different than his prior diverticulitis although he did take 1 dose of his Cipro and Flagyl that he has at home yesterday morning.  The pain is across his lower abdomen.  He has some mild lower back pain.  No known histories of kidney stones.        Past Medical History:  Diagnosis Date  . Adenomatous colon polyp   . Benign prostatic hyperplasia   . DDD (degenerative disc disease), lumbosacral   . Diverticulitis   . GERD (gastroesophageal reflux disease)   . History of hypertension off bp meds since 2018 large weight loss  . Hyperlipidemia   . Inguinal hernia bilateral, non-recurrent   . Internal hemorrhoids   . Vitamin D deficiency     Patient Active Problem List   Diagnosis Date Noted  . Bilateral inguinal hernia (BIH) 06/02/2019  . Personal history of colonic polyps 06/22/2018  . Diverticulosis 06/22/2018  . Benign prostatic hyperplasia without lower urinary tract symptoms 12/19/2016  . Vitamin D deficiency 12/19/2016  . Family history of bladder cancer 12/19/2016  . Diverticulosis of colon without hemorrhage 01/03/2013  . Erectile dysfunction 01/03/2013  . Allergic rhinitis 01/03/2013  . Hypertension 06/17/2012  . Hyperlipidemia 06/17/2012   . Testosterone deficiency 06/17/2012    Past Surgical History:  Procedure Laterality Date  . CYSTOSCOPY  [id number]  . INGUINAL HERNIA REPAIR N/A 06/02/2019   Procedure: LAPAROSCOPIC BILATERAL INGUINAL HERNIA REPAIR, PRIMARY UMBILICAL HERNIA REPAIR;  Surgeon: Michael Boston, MD;  Location: [facility];  Service: General;  Laterality: N/A;  GENERAL WITH ERAS  . neck stitches  yrs ago   cut neck with chainsaw  . TONSILLECTOMY         Family History  Problem Relation Age of Onset  . Hypertension Mother   . Alzheimer's disease Mother   . Chronic Renal Failure Mother   . Colon cancer Father   . Bladder Cancer Father   . Hypertension Brother   . Rectal cancer Neg Hx   . Stomach cancer Neg Hx   . Esophageal cancer Neg Hx     Social History   Tobacco Use  . Smoking status: Never Smoker  . Smokeless tobacco: Never Used  Vaping Use  . Vaping Use: Never used  Substance Use Topics  . Alcohol use: Yes    Comment: occ  . Drug use: No    Home Medications Prior to Admission medications   Medication Sig Start Date End Date Taking? Authorizing Provider  azelastine (ASTELIN) 0.1 % nasal spray Place 1 spray into both nostrils 2 (two) times daily. 02/07/20   Janora Norlander, DO  cetirizine (ZYRTEC) 10 MG tablet  Take 1 tablet (10 mg total) by mouth daily. 02/14/20   Junie Spencer, FNP  ciprofloxacin (CIPRO) 500 MG tablet Take 1 tablet (500 mg total) by mouth 2 (two) times daily. One po bid x 14  days 02/20/20   Rolan Bucco, MD  fluticasone (FLONASE) 50 MCG/ACT nasal spray Place 2 sprays into both nostrils daily. 02/14/20   Junie Spencer, FNP  meloxicam (MOBIC) 15 MG tablet Take 1 tablet (15 mg total) by mouth daily. 02/14/20   Junie Spencer, FNP  metroNIDAZOLE (FLAGYL) 500 MG tablet Take 1 tablet (500 mg total) by mouth 2 (two) times daily. One po bid x 14 days 02/20/20   Rolan Bucco, MD  tadalafil (CIALIS) 20 MG tablet Take 0.5-1 tablets (10-20 mg total) by  mouth every other day as needed for erectile dysfunction. 02/07/20   Raliegh Ip, DO  Vitamin D, Ergocalciferol, (DRISDOL) 1.25 MG (50000 UNIT) CAPS capsule Take 1 capsule (50,000 Units total) by mouth every 7 (seven) days. 02/08/20   Raliegh Ip, DO    Allergies    Patient has no known allergies.  Review of Systems   Review of Systems  Constitutional: Negative for chills, diaphoresis, fatigue and fever.  HENT: Negative for congestion, rhinorrhea and sneezing.   Eyes: Negative.   Respiratory: Negative for cough, chest tightness and shortness of breath.   Cardiovascular: Negative for chest pain and leg swelling.  Gastrointestinal: Positive for abdominal pain. Negative for blood in stool, diarrhea, nausea and vomiting.  Genitourinary: Positive for penile pain and testicular pain. Negative for difficulty urinating, flank pain, frequency and hematuria.  Musculoskeletal: Negative for arthralgias and back pain.  Skin: Negative for rash.  Neurological: Negative for dizziness, speech difficulty, weakness, numbness and headaches.    Physical Exam Updated Vital Signs BP 131/86   Pulse 87   Temp 98.8 F (37.1 C) (Oral)   Resp 17   SpO2 95%   Physical Exam Constitutional:      Appearance: He is well-developed and well-nourished.  HENT:     Head: Normocephalic and atraumatic.  Eyes:     Pupils: Pupils are equal, round, and reactive to light.  Cardiovascular:     Rate and Rhythm: Normal rate and regular rhythm.     Heart sounds: Normal heart sounds.  Pulmonary:     Effort: Pulmonary effort is normal. No respiratory distress.     Breath sounds: Normal breath sounds. No wheezing or rales.  Chest:     Chest wall: No tenderness.  Abdominal:     General: Bowel sounds are normal.     Palpations: Abdomen is soft.     Tenderness: There is abdominal tenderness in the right lower quadrant, suprapubic area and left lower quadrant. There is no guarding or rebound.   Genitourinary:    Comments: No pain over the testicles or scrotum.  No pain in the inguinal canals Musculoskeletal:        General: No edema. Normal range of motion.     Cervical back: Normal range of motion and neck supple.  Lymphadenopathy:     Cervical: No cervical adenopathy.  Skin:    General: Skin is warm and dry.     Findings: No rash.  Neurological:     Mental Status: He is alert and oriented to person, place, and time.  Psychiatric:        Mood and Affect: Mood and affect normal.     ED Results / Procedures / Treatments  Labs (all labs ordered are listed, but only abnormal results are displayed) Labs Reviewed  CBC - Abnormal; Notable for the following components:      Result Value   WBC 14.5 (*)    All other components within normal limits  URINALYSIS, ROUTINE W REFLEX MICROSCOPIC - Abnormal; Notable for the following components:   Ketones, ur 80 (*)    All other components within normal limits  LIPASE, BLOOD  COMPREHENSIVE METABOLIC PANEL    EKG None  Radiology CT Renal Stone Study  Result Date: 02/20/2020 CLINICAL DATA:  Lower abdominal pain and flank pain. EXAM: CT ABDOMEN AND PELVIS WITHOUT CONTRAST TECHNIQUE: Multidetector CT imaging of the abdomen and pelvis was performed following the standard protocol without IV contrast. COMPARISON:  11/11/2018. FINDINGS: Lower chest: Lung bases are clear. Heart size normal. No pericardial or pleural effusion. Distal esophagus is unremarkable. Hepatobiliary: Liver and gallbladder are unremarkable. No biliary ductal dilatation. Pancreas: Negative. Spleen: Negative. Adrenals/Urinary Tract: Adrenal glands and kidneys are unremarkable. No urinary stones. Ureters are decompressed. Bladder is slightly thick-walled. There is stranding in the fat along the superior margin of the bladder. Stomach/Bowel: Stomach, small bowel, appendix and majority of the colon are unremarkable. There is wall thickening involving the sigmoid colon with  surrounding inflammatory haziness and stranding. Vascular/Lymphatic: Vascular structures are unremarkable. No pathologically enlarged lymph nodes. Reproductive: Prostate is visualized. Other: No free fluid. Mesenteries and peritoneum are otherwise unremarkable. Musculoskeletal: Degenerative changes in the spine. IMPRESSION: 1. Sigmoid diverticulitis. Consider correlation with colonoscopy as underlying malignancy cannot be excluded. 2. No urinary stones or obstruction. 3. Bladder is slightly thick-walled but is under distended. Secondary inflammatory changes from adjacent sigmoid diverticulitis not excluded. Electronically Signed   By: Lorin Picket M.D.   On: 02/20/2020 09:15    Procedures Procedures (including critical care time)  Medications Ordered in ED Medications - No data to display  ED Course  I have reviewed the triage vital signs and the nursing notes.  Pertinent labs & imaging results that were available during my care of the patient were reviewed by me and considered in my medical decision making (see chart for details).    MDM Rules/Calculators/A&P                          Patient is a 65 year old male who presents with lower abdominal pain.  His labs show an elevated WBC count but otherwise nonconcerning.  He had a CT scan which shows evidence of diverticulitis.  There is no evidence of perforation or abscess.  He has had similar episodes of diverticulitis in the past although the pain is in a different location.  He says that he always takes Cipro and Flagyl and does well with these medications.  I advised him to take the antibiotics for 10 days although he requested a 14-day supply in case he needed as he has had to have it in the past.  I advised him to follow-up with his PCP.  I did advise him that it was recommended to have a colonoscopy once his symptoms have resolved.  He acknowledges this.  Return precautions were given. Final Clinical Impression(s) / ED Diagnoses Final  diagnoses:  Diverticulitis    Rx / DC Orders ED Discharge Orders         Ordered    ciprofloxacin (CIPRO) 500 MG tablet  2 times daily,   Status:  Discontinued        02/20/20 1030  metroNIDAZOLE (FLAGYL) 500 MG tablet  2 times daily,   Status:  Discontinued        02/20/20 1030    ciprofloxacin (CIPRO) 500 MG tablet  2 times daily        02/20/20 1032    metroNIDAZOLE (FLAGYL) 500 MG tablet  2 times daily        02/20/20 1032           Malvin Johns, MD 02/20/20 1035

## 2020-04-19 ENCOUNTER — Ambulatory Visit (INDEPENDENT_AMBULATORY_CARE_PROVIDER_SITE_OTHER): Payer: 59 | Admitting: Internal Medicine

## 2020-04-19 ENCOUNTER — Other Ambulatory Visit: Payer: Self-pay

## 2020-04-19 ENCOUNTER — Encounter: Payer: Self-pay | Admitting: Internal Medicine

## 2020-04-19 VITALS — BP 156/100 | HR 80 | Ht 68.5 in | Wt 190.5 lb

## 2020-04-19 DIAGNOSIS — Z86010 Personal history of colonic polyps: Secondary | ICD-10-CM

## 2020-04-19 DIAGNOSIS — Z8719 Personal history of other diseases of the digestive system: Secondary | ICD-10-CM | POA: Diagnosis not present

## 2020-04-19 MED ORDER — METRONIDAZOLE 250 MG PO TABS: 250.0000 mg | ORAL_TABLET | Freq: Three times a day (TID) | ORAL | 0 refills | Status: DC

## 2020-04-19 MED ORDER — CIPROFLOXACIN HCL 500 MG PO TABS: 500.0000 mg | ORAL_TABLET | Freq: Two times a day (BID) | ORAL | 0 refills | Status: DC

## 2020-04-19 NOTE — Patient Instructions (Signed)
You have been given a prescription for Cipro and flagyl to take to the pharmacy in the event of a diverticulitis episode.  You will be due for a repeat colonoscopy in October of 2022.  We will send you a letter when it gets closer.  If you are age 67 or older, your body mass index should be between 23-30. Your Body mass index is 28.54 kg/m. If this is out of the aforementioned range listed, please consider follow up with your Primary Care Provider.  If you are age 35 or younger, your body mass index should be between 19-25. Your Body mass index is 28.54 kg/m. If this is out of the aformentioned range listed, please consider follow up with your Primary Care Provider.

## 2020-04-19 NOTE — Progress Notes (Signed)
Subjective:    Patient ID: Carl Mccullough, male    DOB: 04/30/1955, 65 y.o.   MRN: [id number]  HPI Carl Mccullough is a 65 year old male with a history of multiple colon polyps (adenomatous and sessile serrated), colonic diverticulosis with history of intermittent diverticulitis, family history of colon cancer in his father at age 70, bilateral inguinal hernia status post repair who is here for follow-up.  He is here today with his wife and was last seen in the office on 02/01/2019.  He has a history of hypertension, hyperlipidemia and BPH.  He developed recurrent lower abdominal pain, this side in the lower more right abdomen in January 2022.  This progressed and initially he did not feel like this was diverticulitis.  He was seen in the emergency department where a CT scan was performed on 02/20/2020.  This showed sigmoid diverticulitis.  There was slightly thickened bladder wall but it was under distended.  He was treated with ciprofloxacin and metronidazole to complete resolution.  No change in bowel habit.  Bowel movements have been regular.  No blood in stool or melena.  No further abdominal pain.  Good appetite.  Weight has been stable to slightly increased.  No upper GI or hepatobiliary complaint.  His last colonoscopy was performed on 12/16/2018.  There were 5 polyps all 5 mm or less which were adenomatous, sessile serrated and one hyperplastic.   Review of Systems As per HPI, otherwise negative  Current Medications, Allergies, Past Medical History, Past Surgical History, Family History and Social History were reviewed in [employer] record.     Objective:   Physical Exam BP (!) 156/100 (BP Location: Left Arm, Patient Position: Sitting, Cuff Size: Normal)   Pulse 80   Ht 5' 8.5" (1.74 m)   Wt 190 lb 8 oz (86.4 kg)   BMI 28.54 kg/m  Gen: awake, alert, NAD HEENT: anicteric, op clear CV: RRR, no mrg Pulm: CTA b/l Abd: soft, NT/ND, +BS throughout Ext: no  c/c/e Neuro: nonfocal  CBC    Component Value Date/Time   WBC 14.5 (H) 02/19/2020 1646   RBC 4.89 02/19/2020 1646   HGB 15.8 02/19/2020 1646   HGB 15.3 08/03/2018 0937   HCT 47.5 02/19/2020 1646   HCT 44.4 08/03/2018 0937   PLT 241 02/19/2020 1646   PLT 262 08/03/2018 0937   MCV 97.1 02/19/2020 1646   MCV 94 08/03/2018 0937   MCH 32.3 02/19/2020 1646   MCHC 33.3 02/19/2020 1646   RDW 12.2 02/19/2020 1646   RDW 12.6 08/03/2018 0937   LYMPHSABS 2.2 12/10/2018 1454   LYMPHSABS 2.2 08/03/2018 0937   MONOABS 0.5 12/10/2018 1454   EOSABS 0.1 12/10/2018 1454   EOSABS 0.2 08/03/2018 0937   BASOSABS 0.0 12/10/2018 1454   BASOSABS 0.0 08/03/2018 0937   CMP     Component Value Date/Time   NA 136 02/19/2020 1646   NA 135 02/07/2020 1559   K 4.8 02/19/2020 1646   CL 104 02/19/2020 1646   CO2 22 02/19/2020 1646   GLUCOSE 98 02/19/2020 1646   BUN 11 02/19/2020 1646   BUN 11 02/07/2020 1559   CREATININE 1.14 02/19/2020 1646   CREATININE 0.92 01/03/2013 0832   CALCIUM 9.2 02/19/2020 1646   PROT 7.4 02/19/2020 1646   PROT 7.4 02/07/2020 1559   ALBUMIN 3.8 02/19/2020 1646   ALBUMIN 4.6 02/07/2020 1559   AST 19 02/19/2020 1646   ALT 23 02/19/2020 1646   ALKPHOS 84 02/19/2020 1646  BILITOT 1.2 02/19/2020 1646   BILITOT 0.3 02/07/2020 1559   GFRNONAA >60 02/19/2020 1646   GFRNONAA >89 01/03/2013 0832   GFRAA 100 02/07/2020 1559   GFRAA >89 01/03/2013 0832         Assessment & Plan:   65 year old male with a history of multiple colon polyps (adenomatous and sessile serrated), colonic diverticulosis with history of intermittent diverticulitis, family history of colon cancer in his father at age 87, bilateral inguinal hernia status post repair who is here for follow-up.  1.  History of recurrent diverticulitis --he has had several episodes of colonic diverticulitis, traditionally sigmoid for him, but he does have diverticulosis from the ascending colon to sigmoid.  Fortunately  he has not had a history of complicated diverticulitis or the need for hospitalization.  We discussed this today and he was treated to resolution with metronidazole and ciprofloxacin again in January.  He is certainly up-to-date with screening and surveillance colonoscopy.  We discussed surgical resection but I do not feel that this is needed or indicated at this time.  Over time I expect he will have intermittent diverticulitis, given his history to this point, and we will need to treat with antibiotics when this occurs.  He is not having current bladder symptoms. --Prescription for ciprofloxacin 500 mg twice daily and metronidazole 250 mg 3 times daily x7 days; he would only use this in the event of recurrent diverticulitis symptoms.  I asked that he notify me by MyChart message or phone call if he has to use this prescription --See #2  2.  History of multiple adenomatous colon polyps and family history of colon cancer --given his history we had decided to perform surveillance colonoscopy at the 2-year mark.  I continue with this recommendation which for him would be October 2022 --Surveillance colonoscopy later this year around October  30 minutes total spent today including patient facing time, coordination of care, reviewing medical history/procedures/pertinent radiology studies, and documentation of the encounter.

## 2020-05-07 ENCOUNTER — Other Ambulatory Visit: Payer: Self-pay

## 2020-05-07 ENCOUNTER — Ambulatory Visit: Payer: 59

## 2020-05-07 DIAGNOSIS — Z0130 Encounter for examination of blood pressure without abnormal findings: Secondary | ICD-10-CM

## 2020-05-07 NOTE — Progress Notes (Signed)
Patient came in for a BP check. First reading was 150/98 p-75. Patient sat for a few minutes and I re look it. Second reading was 146/96.   Will send to PCP to review

## 2020-05-07 NOTE — Progress Notes (Signed)
BP elevated.  If not already doing a salt restricted diet, please start one.  If he is already restricting, recommend starting Norvasc 5mg  daily and have him recheck BP here in office in 2 weeks.  Dietra Stokely M. Lajuana Ripple, [facility]

## 2020-05-07 NOTE — Progress Notes (Signed)
Patient states he will start a salt restricted diet and aware to be seen In 2 weeks for BP check.

## 2020-05-22 ENCOUNTER — Telehealth: Payer: Self-pay

## 2020-05-26 ENCOUNTER — Emergency Department (HOSPITAL_COMMUNITY)
Admission: EM | Admit: 2020-05-26 | Discharge: 2020-05-26 | Disposition: A | Discharge status: Home / Self Care | Payer: 59 | Attending: Emergency Medicine | Admitting: Emergency Medicine

## 2020-05-26 ENCOUNTER — Other Ambulatory Visit: Payer: Self-pay

## 2020-05-26 ENCOUNTER — Encounter (HOSPITAL_COMMUNITY): Payer: Self-pay

## 2020-05-26 ENCOUNTER — Emergency Department (HOSPITAL_COMMUNITY): Payer: 59

## 2020-05-26 DIAGNOSIS — R109 Unspecified abdominal pain: Secondary | ICD-10-CM | POA: Diagnosis present

## 2020-05-26 DIAGNOSIS — K219 Gastro-esophageal reflux disease without esophagitis: Secondary | ICD-10-CM | POA: Insufficient documentation

## 2020-05-26 DIAGNOSIS — K5792 Diverticulitis of intestine, part unspecified, without perforation or abscess without bleeding: Secondary | ICD-10-CM | POA: Diagnosis not present

## 2020-05-26 LAB — COMPREHENSIVE METABOLIC PANEL
ALT: 20 U/L (ref 0–44)
AST: 19 U/L (ref 15–41)
Albumin: 4 g/dL (ref 3.5–5.0)
Alkaline Phosphatase: 74 U/L (ref 38–126)
Anion gap: 9 (ref 5–15)
BUN: 11 mg/dL (ref 8–23)
CO2: 24 mmol/L (ref 22–32)
Calcium: 9.6 mg/dL (ref 8.9–10.3)
Chloride: 99 mmol/L (ref 98–111)
Creatinine, Ser: 0.92 mg/dL (ref 0.61–1.24)
GFR, Estimated: >60 mL/min (ref >60)
Glucose, Bld: 110 mg/dL — ABNORMAL HIGH (ref 70–99)
Potassium: 4.1 mmol/L (ref 3.5–5.1)
Sodium: 132 mmol/L — ABNORMAL LOW (ref 135–145)
Total Bilirubin: 1 mg/dL (ref 0.3–1.2)
Total Protein: 7.4 g/dL (ref 6.5–8.1)

## 2020-05-26 LAB — CBC WITH DIFFERENTIAL/PLATELET
Abs Immature Granulocytes: 0.05 10*3/uL (ref 0.00–0.07)
Basophils Absolute: 0 10*3/uL (ref 0.0–0.1)
Basophils Relative: 0 %
Eosinophils Absolute: 0 10*3/uL (ref 0.0–0.5)
Eosinophils Relative: 0 %
HCT: 45.6 % (ref 39.0–52.0)
Hemoglobin: 15.8 g/dL (ref 13.0–17.0)
Immature Granulocytes: 0 %
Lymphocytes Relative: 13 %
Lymphs Abs: 1.8 10*3/uL (ref 0.7–4.0)
MCH: 32.8 pg (ref 26.0–34.0)
MCHC: 34.6 g/dL (ref 30.0–36.0)
MCV: 94.6 fL (ref 80.0–100.0)
Monocytes Absolute: 1.2 10*3/uL — ABNORMAL HIGH (ref 0.1–1.0)
Monocytes Relative: 9 %
Neutro Abs: 10.8 10*3/uL — ABNORMAL HIGH (ref 1.7–7.7)
Neutrophils Relative %: 78 %
Platelets: 252 10*3/uL (ref 150–400)
RBC: 4.82 MIL/uL (ref 4.22–5.81)
RDW: 12.5 % (ref 11.5–15.5)
WBC: 13.9 10*3/uL — ABNORMAL HIGH (ref 4.0–10.5)
nRBC: 0 % (ref 0.0–0.2)

## 2020-05-26 LAB — URINALYSIS, ROUTINE W REFLEX MICROSCOPIC
Bilirubin Urine: NEGATIVE
Glucose, UA: NEGATIVE mg/dL
Hgb urine dipstick: NEGATIVE
Ketones, ur: 20 mg/dL — AB
Leukocytes,Ua: NEGATIVE
Nitrite: NEGATIVE
Protein, ur: NEGATIVE mg/dL
Specific Gravity, Urine: 1.014 (ref 1.005–1.030)
pH: 6 (ref 5.0–8.0)

## 2020-05-26 LAB — LIPASE, BLOOD: Lipase: 40 U/L (ref 11–51)

## 2020-05-26 MED ORDER — ONDANSETRON HCL 4 MG/2ML IJ SOLN
4.0000 mg | Freq: Once | INTRAMUSCULAR | Status: AC
  Administered 2020-05-26: 4 mg via INTRAVENOUS
  Filled 2020-05-26: qty 2

## 2020-05-26 MED ORDER — LIDOCAINE-EPINEPHRINE (PF) 2 %-1:200000 IJ SOLN: 10.0000 mL | Freq: Once | INTRAMUSCULAR | Status: DC

## 2020-05-26 MED ORDER — IOHEXOL 300 MG/ML  SOLN
100.0000 mL | Freq: Once | INTRAMUSCULAR | Status: AC | PRN
  Administered 2020-05-26: 100 mL via INTRAVENOUS

## 2020-05-26 MED ORDER — METRONIDAZOLE 500 MG PO TABS: 500.0000 mg | ORAL_TABLET | Freq: Three times a day (TID) | ORAL | 0 refills | Status: DC

## 2020-05-26 MED ORDER — FENTANYL CITRATE (PF) 100 MCG/2ML IJ SOLN
50.0000 ug | Freq: Once | INTRAMUSCULAR | Status: AC
  Administered 2020-05-26: 50 ug via INTRAVENOUS
  Filled 2020-05-26: qty 2

## 2020-05-26 NOTE — ED Notes (Signed)
ED Provider at bedside. 

## 2020-05-26 NOTE — Discharge Instructions (Signed)
Please read and follow all provided instructions.  Your diagnoses today include:  1. Diverticulitis     Tests performed today include:  Blood cell counts and platelets - slightly high white blood cells  Kidney and liver function tests  Pancreas function test (called lipase)  Urine test to look for infection  CT scan of the abdomen and pelvis - shows diverticulitis with more severe inflammation than on CT in January  Vital signs. See below for your results today.   Medications prescribed:   Metronidazole - antibiotic for diverticulitis  Take any prescribed medications only as directed.  Home care instructions:   Follow any educational materials contained in this packet.  Please continue home Cipro at 500 mg twice daily.  I have written you a prescription for Flagyl to take 3 times a day at 500 mg.  Follow-up instructions: Please follow-up with your gastroenterologist next week for further evaluation of your symptoms.    Return instructions:  SEEK IMMEDIATE MEDICAL ATTENTION IF:  The pain does not go away or becomes severe   A temperature above 101F develops   Repeated vomiting occurs (multiple episodes)   The pain becomes localized to portions of the abdomen. The right side could possibly be appendicitis. In an adult, the left lower portion of the abdomen could be colitis or diverticulitis.   Blood is being passed in stools or vomit (bright red or black tarry stools)   You develop chest pain, difficulty breathing, dizziness or fainting, or become confused, poorly responsive, or inconsolable (young children)  If you have any other emergent concerns regarding your health  Additional Information: Abdominal (belly) pain can be caused by many things. Your caregiver performed an examination and possibly ordered blood/urine tests and imaging (CT scan, x-rays, ultrasound). Many cases can be observed and treated at home after initial evaluation in the emergency department.  Even though you are being discharged home, abdominal pain can be unpredictable. Therefore, you need a repeated exam if your pain does not resolve, returns, or worsens. Most patients with abdominal pain don't have to be admitted to the hospital or have surgery, but serious problems like appendicitis and gallbladder attacks can start out as nonspecific pain. Many abdominal conditions cannot be diagnosed in one visit, so follow-up evaluations are very important.  Your vital signs today were: BP (!) 138/96   Pulse 79   Temp 98.4 F (36.9 C) (Oral)   Resp 14   Ht 5\' 10"  (1.778 m)   Wt 84.8 kg   SpO2 97%   BMI 26.83 kg/m  If your blood pressure (bp) was elevated above 135/85 this visit, please have this repeated by your doctor within one month. --------------

## 2020-05-26 NOTE — ED Triage Notes (Signed)
Started with sharp pain in mid left back (flank area) on Thursday, he keeps doxycycline on hand for diverticulits flare ups and he started taking the doxycline last night, has taken two tablets

## 2020-05-26 NOTE — ED Notes (Signed)
At 0959 I administered 71mcg fentanyl per orders.  The additional 50 mcg fentanyl was wasted with Sherlon Handing, Pharm D at [phone]

## 2020-05-26 NOTE — ED Provider Notes (Signed)
[facility] EMERGENCY DEPARTMENT Provider Note   CSN: [id number] Arrival date & time: 05/26/20  4782     History Chief Complaint  Patient presents with  . Abdominal Pain    Since yesterday, no n/v/d, with back pain on left side    Carl Mccullough is a 65 y.o. male.  Patient with history of recurrent diverticulitis presents the emergency department for evaluation of abdominal pain.  Patient presents with abdominal pain, left greater than right, with radiation into the back and down to the left testicle.  No reported scrotal swelling.  He has some aching with urine nation, but no dysuria or hematuria.  No fevers, chest pain, or shortness of breath.  No nausea, vomiting, diarrhea, or constipation.  Patient follows with Dr. Hilarie Fredrickson of GI.  He has Cipro and metronidazole on hand for recurrent flares and has taken 2 doses.  He presents today because pain was more severe than usual and had radiation to the back and groin which is also unusual compared to previous presentations. The onset of this condition was acute. The course is constant. Aggravating factors: none. Alleviating factors: none.          Past Medical History:  Diagnosis Date  . Adenomatous colon polyp   . Benign prostatic hyperplasia   . DDD (degenerative disc disease), lumbosacral   . Diverticulitis   . GERD (gastroesophageal reflux disease)   . History of hypertension off bp meds since 2018 large weight loss  . Hyperlipidemia   . Inguinal hernia bilateral, non-recurrent   . Internal hemorrhoids   . Vitamin D deficiency     Patient Active Problem List   Diagnosis Date Noted  . Bilateral inguinal hernia (BIH) 06/02/2019  . Personal history of colonic polyps 06/22/2018  . Diverticulosis 06/22/2018  . Benign prostatic hyperplasia without lower urinary tract symptoms 12/19/2016  . Vitamin D deficiency 12/19/2016  . Family history of bladder cancer 12/19/2016  . Diverticulosis of colon without  hemorrhage 01/03/2013  . Erectile dysfunction 01/03/2013  . Allergic rhinitis 01/03/2013  . Hypertension 06/17/2012  . Hyperlipidemia 06/17/2012  . Testosterone deficiency 06/17/2012    Past Surgical History:  Procedure Laterality Date  . CYSTOSCOPY  [id number]  . INGUINAL HERNIA REPAIR N/A 06/02/2019   Procedure: LAPAROSCOPIC BILATERAL INGUINAL HERNIA REPAIR, PRIMARY UMBILICAL HERNIA REPAIR;  Surgeon: Michael Boston, MD;  Location: [facility];  Service: General;  Laterality: N/A;  GENERAL WITH ERAS  . neck stitches  yrs ago   cut neck with chainsaw  . TONSILLECTOMY         Family History  Problem Relation Age of Onset  . Hypertension Mother   . Alzheimer's disease Mother   . Chronic Renal Failure Mother   . Colon cancer Father   . Bladder Cancer Father   . Hypertension Brother   . Rectal cancer Neg Hx   . Stomach cancer Neg Hx   . Esophageal cancer Neg Hx     Social History   Tobacco Use  . Smoking status: Never Smoker  . Smokeless tobacco: Never Used  Vaping Use  . Vaping Use: Never used  Substance Use Topics  . Alcohol use: Yes    Comment: occ  . Drug use: No    Home Medications Prior to Admission medications   Medication Sig Start Date End Date Taking? Authorizing Provider  azelastine (ASTELIN) 0.1 % nasal spray Place 1 spray into both nostrils 2 (two) times daily. 02/07/20   Ronnie Doss  M, DO  cetirizine (ZYRTEC) 10 MG tablet Take 1 tablet (10 mg total) by mouth daily. 02/14/20   Sharion Balloon, FNP  ciprofloxacin (CIPRO) 500 MG tablet Take 1 tablet (500 mg total) by mouth 2 (two) times daily. 04/19/20   Pyrtle, Lajuan Lines, MD  fluticasone (FLONASE) 50 MCG/ACT nasal spray Place 2 sprays into both nostrils daily. 02/14/20   Sharion Balloon, FNP  meloxicam (MOBIC) 15 MG tablet Take 1 tablet (15 mg total) by mouth daily. 02/14/20   Sharion Balloon, FNP  metroNIDAZOLE (FLAGYL) 250 MG tablet Take 1 tablet (250 mg total) by mouth 3 (three) times  daily. 04/19/20   Pyrtle, Lajuan Lines, MD  tadalafil (CIALIS) 20 MG tablet Take 0.5-1 tablets (10-20 mg total) by mouth every other day as needed for erectile dysfunction. 02/07/20   Janora Norlander, DO  Vitamin D, Ergocalciferol, (DRISDOL) 1.25 MG (50000 UNIT) CAPS capsule Take 1 capsule (50,000 Units total) by mouth every 7 (seven) days. 02/08/20   Janora Norlander, DO    Allergies    Patient has no known allergies.  Review of Systems   Review of Systems  Constitutional: Negative for fever.  HENT: Negative for rhinorrhea and sore throat.   Eyes: Negative for redness.  Respiratory: Negative for cough.   Cardiovascular: Negative for chest pain.  Gastrointestinal: Positive for abdominal pain. Negative for constipation, diarrhea, nausea and vomiting.  Genitourinary: Positive for testicular pain. Negative for dysuria, hematuria and scrotal swelling.  Musculoskeletal: Positive for back pain. Negative for myalgias.  Skin: Negative for rash.  Neurological: Negative for headaches.    Physical Exam Updated Vital Signs BP (!) 150/110 (BP Location: Right Arm)   Pulse 89   Temp 98.4 F (36.9 C) (Oral)   Resp 16   Ht 5\' 10"  (1.778 m)   Wt 84.8 kg   SpO2 96%   BMI 26.83 kg/m   Physical Exam Vitals and nursing note reviewed.  Constitutional:      Appearance: He is well-developed.  HENT:     Head: Normocephalic and atraumatic.  Eyes:     General:        Right eye: No discharge.        Left eye: No discharge.     Conjunctiva/sclera: Conjunctivae normal.  Cardiovascular:     Rate and Rhythm: Normal rate and regular rhythm.     Heart sounds: Normal heart sounds.  Pulmonary:     Effort: Pulmonary effort is normal.     Breath sounds: Normal breath sounds.  Abdominal:     Palpations: Abdomen is soft.     Tenderness: There is abdominal tenderness.     Comments: Mild right lower quadrant tenderness, moderate left-sided mid and lower abdominal tenderness.  No rebound or guarding.   Genitourinary:    Testes:        Right: Swelling not present.        Left: Swelling not present.  Musculoskeletal:     Cervical back: Normal range of motion and neck supple.  Skin:    General: Skin is warm and dry.  Neurological:     Mental Status: He is alert.     ED Results / Procedures / Treatments   Labs (all labs ordered are listed, but only abnormal results are displayed) Labs Reviewed  CBC WITH DIFFERENTIAL/PLATELET - Abnormal; Notable for the following components:      Result Value   WBC 13.9 (*)    Neutro Abs 10.8 (*)  Monocytes Absolute 1.2 (*)    All other components within normal limits  COMPREHENSIVE METABOLIC PANEL - Abnormal; Notable for the following components:   Sodium 132 (*)    Glucose, Bld 110 (*)    All other components within normal limits  URINALYSIS, ROUTINE W REFLEX MICROSCOPIC - Abnormal; Notable for the following components:   Ketones, ur 20 (*)    All other components within normal limits  LIPASE, BLOOD    EKG None  Radiology CT ABDOMEN PELVIS W CONTRAST  Result Date: 05/26/2020 CLINICAL DATA:  Lower abdominal pain EXAM: CT ABDOMEN AND PELVIS WITH CONTRAST TECHNIQUE: Multidetector CT imaging of the abdomen and pelvis was performed using the standard protocol following bolus administration of intravenous contrast. CONTRAST:  147mL OMNIPAQUE IOHEXOL 300 MG/ML  SOLN COMPARISON:  February 20, 2020 FINDINGS: Lower chest: Lung bases are clear. Hepatobiliary: No focal liver lesions are evident. Gallbladder wall is not appreciably thickened. No evident biliary duct dilatation. Pancreas: There is no pancreatic mass or inflammatory focus. Spleen: No splenic lesions are evident. Adrenals/Urinary Tract: Adrenals bilaterally appear normal. No evident renal mass or hydronephrosis on either side. No appreciable renal or ureteral calculus on either side. Urinary bladder is midline with wall thickness within normal limits. Stomach/Bowel: Extensive wall thickening  is noted in the mid sigmoid colon region, similar location but more severe at this time compared to prior study from January 2022. There is surrounding fluid and soft tissue stranding with several irregular diverticula. Essentially complete obliteration of the lumen over a distance of 5 cm noted in this area. No associated abscess or perforation in this area of diverticulitis. No other appreciable bowel wall thickening. No findings suggesting bowel obstruction. Terminal ileum appears normal. Appendix appears normal. No evident free air or portal venous air. Vascular/Lymphatic: There is no abdominal aortic aneurysm. No appreciable arterial vascular lesion evident beyond a degree of aortic tortuosity. Major venous structures appear patent. No evident adenopathy in the abdomen or pelvis. Reproductive: Prostate and seminal vesicles normal in size and contour. Other: No abscess or ascites evident in the abdomen or pelvis. There is slight fat in the umbilicus. Musculoskeletal: There are foci of degenerative change in the lumbar spine. No blastic or lytic bone lesions. No intramuscular or abdominal wall lesions are evident. IMPRESSION: 1. Diverticulitis involving the proximal to mid sigmoid colon with marked wall thickening in this area. There are several irregular diverticula with soft tissue stranding and fluid, primarily posteriorly and to the left of the area of marked wall thickening. No perforation or abscess is seen in this area. Note that a degree of underlying neoplasm with inflammation in this portion of the colon can present in this manner. This finding warrants direct visualization to assess for possible underlying neoplasm after treatment for acute inflammation. Note that the degree of inflammation in this area is essentially unchanged in location but somewhat more severe overall compared to the January 2022 study. 2. No bowel obstruction. No abscess in the abdomen or pelvis. Appendix appears normal. 3. No  renal or ureteral calculus. No hydronephrosis on either side. Urinary bladder wall thickness is within normal limits. Electronically Signed   By: Lowella Grip III M.D.   On: 05/26/2020 11:36    Procedures Procedures   Medications Ordered in ED Medications - No data to display  ED Course  I have reviewed the triage vital signs and the nursing notes.  Pertinent labs & imaging results that were available during my care of the patient were reviewed  by me and considered in my medical decision making (see chart for details).  Patient seen and examined. Work-up initiated. Will obtain CT imaging as symptoms are not completely typical for diverticulitis per history.  Fentanyl ordered for pain.   Vital signs reviewed and are as follows: BP (!) 150/110 (BP Location: Right Arm)   Pulse 89   Temp 98.4 F (36.9 C) (Oral)   Resp 16   Ht 5\' 10"  (1.778 m)   Wt 84.8 kg   SpO2 96%   BMI 26.83 kg/m   12:12 PM CT reviewed personally.  The patient's ureter courses through the area of inflammation which probably explains his flank and pelvic pain with this course of diverticulitis.  Patient's pain is currently controlled.  No vomiting.  No complications on CT imaging.  Patient is comfortable with discharge to home.  He was written for 50 mg of Flagyl by GI but states that he has been on 500 in the past.  I will give him a prescription for 500 mg 3 times daily.  He has Cipro at home.  He is well versed in antibiotic treatment for diverticulitis.  Encouraged GI follow-up.  Encouraged return to the emergency department with uncontrolled pain, persistent vomiting, fevers, new symptoms or other concerns.  He verbalizes understanding agrees with plan.    MDM Rules/Calculators/A&P                          Patient presents today with lower abdominal pain.  CT shows recurrent diverticulitis.  Patient with multiple sporadic bouts of diverticulitis in the past.  White blood cell count is elevated.  No fevers  or other signs of sepsis.  No vomiting and he is tolerating orals just fine.  As this appears to be uncomplicated diverticulitis, albeit worse inflammation on CT compared to January, will give trial of antibiotics at home.  He has good gastroenterology follow-up and seems willing and able to return if his symptoms get worse.   Final Clinical Impression(s) / ED Diagnoses Final diagnoses:  Diverticulitis    Rx / DC Orders ED Discharge Orders         Ordered    metroNIDAZOLE (FLAGYL) 500 MG tablet  3 times daily        05/26/20 1210           Carlisle Cater, PA-C 05/26/20 1214    Pattricia Boss, MD 05/26/20 1623

## 2020-06-04 ENCOUNTER — Telehealth: Payer: Self-pay | Admitting: Internal Medicine

## 2020-06-04 MED ORDER — METRONIDAZOLE 250 MG PO TABS: 250.0000 mg | ORAL_TABLET | Freq: Three times a day (TID) | ORAL | 0 refills | Status: DC

## 2020-06-04 MED ORDER — CIPROFLOXACIN HCL 500 MG PO TABS: 500.0000 mg | ORAL_TABLET | Freq: Two times a day (BID) | ORAL | 0 refills | Status: DC

## 2020-06-04 NOTE — Telephone Encounter (Signed)
Spoke with pt and he is aware. Refills sent in for pt. Pt wants to know if it would be ok to wait until October to have the colon done at his recall date. Please advise.

## 2020-06-04 NOTE — Telephone Encounter (Signed)
See note below and advise. 

## 2020-06-04 NOTE — Telephone Encounter (Signed)
Okay for refill of Cipro and Flagyl; ciprofloxacin 500 mg twice daily and metronidazole 250 mg 3 times daily x10 days  I saw his CT scan from 9 days ago  I think he should have a colonoscopy 4 to 6 weeks after complete resolution of diverticulitis Please arrange for this procedure

## 2020-06-04 NOTE — Telephone Encounter (Signed)
I think we should proceed sooner when his diverticulitis flare has resolved x approx 4 weeks

## 2020-06-05 NOTE — Telephone Encounter (Signed)
No return call 

## 2020-06-05 NOTE — Telephone Encounter (Signed)
Spoke with pt and he is aware. Please contact pt to schedule colonoscopy in 4 weeks with Dr. Norman Herrlich. He will need previsit also.

## 2020-07-12 ENCOUNTER — Other Ambulatory Visit: Payer: Self-pay

## 2020-07-12 ENCOUNTER — Telehealth: Payer: Self-pay | Admitting: *Deleted

## 2020-07-12 ENCOUNTER — Ambulatory Visit (AMBULATORY_SURGERY_CENTER): Payer: 59 | Admitting: *Deleted

## 2020-07-12 VITALS — Ht 68.5 in | Wt 185.0 lb

## 2020-07-12 DIAGNOSIS — K5732 Diverticulitis of large intestine without perforation or abscess without bleeding: Secondary | ICD-10-CM

## 2020-07-12 DIAGNOSIS — Z860100 Personal history of colon polyps, unspecified: Secondary | ICD-10-CM

## 2020-07-12 MED ORDER — NA SULFATE-K SULFATE-MG SULF 17.5-3.13-1.6 GM/177ML PO SOLN: 1.0000 | Freq: Once | ORAL | 0 refills | Status: AC

## 2020-07-12 NOTE — Progress Notes (Addendum)
No egg or soy allergy known to patient  No issues with past sedation with any surgeries or procedures Patient denies ever being told they had issues or difficulty with intubation  No FH of Malignant Hyperthermia No diet pills per patient No home 02 use per patient  No blood thinners per patient  Pt denies issues with constipation  No A fib or A flutter  EMMI video to pt or via [place] 19 guidelines implemented in PV today with Pt and RN   Virtual pre visit completed. Instructions mailed to patient.    Discussed with pt there will be an out-of-pocket cost for prep and that varies from $0 to 70 dollars   Due to the COVID-19 pandemic we are asking patients to follow certain guidelines.  Pt aware of COVID protocols and [facility] guidelines

## 2020-07-12 NOTE — Telephone Encounter (Signed)
Virtual pre visit completed.

## 2020-07-30 ENCOUNTER — Encounter: Payer: Self-pay | Admitting: Internal Medicine

## 2020-07-31 ENCOUNTER — Ambulatory Visit (AMBULATORY_SURGERY_CENTER): Payer: 59 | Admitting: Internal Medicine

## 2020-07-31 ENCOUNTER — Other Ambulatory Visit: Payer: Self-pay

## 2020-07-31 ENCOUNTER — Encounter: Payer: Self-pay | Admitting: Internal Medicine

## 2020-07-31 VITALS — BP 130/82 | HR 64 | Temp 96.6°F | Resp 16 | Ht 68.5 in | Wt 185.0 lb

## 2020-07-31 DIAGNOSIS — K635 Polyp of colon: Secondary | ICD-10-CM | POA: Diagnosis not present

## 2020-07-31 DIAGNOSIS — Z86010 Personal history of colonic polyps: Secondary | ICD-10-CM

## 2020-07-31 DIAGNOSIS — D122 Benign neoplasm of ascending colon: Secondary | ICD-10-CM

## 2020-07-31 DIAGNOSIS — D125 Benign neoplasm of sigmoid colon: Secondary | ICD-10-CM

## 2020-07-31 MED ORDER — SODIUM CHLORIDE 0.9 % IV SOLN: 500.0000 mL | Freq: Once | INTRAVENOUS | Status: DC

## 2020-07-31 NOTE — Patient Instructions (Signed)
Handout on polyps and diverticulosis given    YOU HAD AN ENDOSCOPIC PROCEDURE TODAY AT THE [facility]:   Refer to the procedure report that was given to you for any specific questions about what was found during the examination.  If the procedure report does not answer your questions, please call your gastroenterologist to clarify.  If you requested that your care partner not be given the details of your procedure findings, then the procedure report has been included in a sealed envelope for you to review at your convenience later.  YOU SHOULD EXPECT: Some feelings of bloating in the abdomen. Passage of more gas than usual.  Walking can help get rid of the air that was put into your GI tract during the procedure and reduce the bloating. If you had a lower endoscopy (such as a colonoscopy or flexible sigmoidoscopy) you may notice spotting of blood in your stool or on the toilet paper. If you underwent a bowel prep for your procedure, you may not have a normal bowel movement for a few days.  Please Note:  You might notice some irritation and congestion in your nose or some drainage.  This is from the oxygen used during your procedure.  There is no need for concern and it should clear up in a day or so.  SYMPTOMS TO REPORT IMMEDIATELY:   Following lower endoscopy (colonoscopy or flexible sigmoidoscopy):  Excessive amounts of blood in the stool  Significant tenderness or worsening of abdominal pains  Swelling of the abdomen that is new, acute  Fever of 100F or higher   For urgent or emergent issues, a gastroenterologist can be reached at any hour by calling [phone]. Do not use MyChart messaging for urgent concerns.    DIET:  We do recommend a small meal at first, but then you may proceed to your regular diet.  Drink plenty of fluids but you should avoid alcoholic beverages for 24 hours.  ACTIVITY:  You should plan to take it easy for the rest of today and you should NOT  DRIVE or use heavy machinery until tomorrow (because of the sedation medicines used during the test).    FOLLOW UP: Our staff will call the number listed on your records 48-72 hours following your procedure to check on you and address any questions or concerns that you may have regarding the information given to you following your procedure. If we do not reach you, we will leave a message.  We will attempt to reach you two times.  During this call, we will ask if you have developed any symptoms of COVID 19. If you develop any symptoms (ie: fever, flu-like symptoms, shortness of breath, cough etc.) before then, please call [phone].  If you test positive for Covid 19 in the 2 weeks post procedure, please call and report this information to us.    If any biopsies were taken you will be contacted by phone or by letter within the next 1-3 weeks.  Please call us at [phone] if you have not heard about the biopsies in 3 weeks.    SIGNATURES/CONFIDENTIALITY: You and/or your care partner have signed paperwork which will be entered into your electronic medical record.  These signatures attest to the fact that that the information above on your After Visit Summary has been reviewed and is understood.  Full responsibility of the confidentiality of this discharge information lies with you and/or your care-partner. 

## 2020-07-31 NOTE — Progress Notes (Signed)
pt tolerated well. VSS. awake and to recovery. Report given to RN.  

## 2020-07-31 NOTE — Op Note (Signed)
[facility] Patient Name: Carl Mccullough Procedure Date: 07/31/2020 10:19 AM MRN: [id number] Endoscopist: Jerene Bears , MD Age: 65 Referring MD:  Date of Birth: 25-Dec-1955 Gender: Male Account #: [id number] Procedure:                Colonoscopy Indications:              High risk colon cancer surveillance: Personal                            history of multiple adenomas and sessile-serrated                            polyps, Last colonoscopy: October 2020; family                            history of colon cancer; personal history of                            recurrent uncomplicated diverticulitis Medicines:                Monitored Anesthesia Care Procedure:                Pre-Anesthesia Assessment:                           - Prior to the procedure, a History and Physical                            was performed, and patient medications and                            allergies were reviewed. The patient's tolerance of                            previous anesthesia was also reviewed. The risks                            and benefits of the procedure and the sedation                            options and risks were discussed with the patient.                            All questions were answered, and informed consent                            was obtained. Prior Anticoagulants: The patient has                            taken no previous anticoagulant or antiplatelet                            agents. ASA Grade Assessment: II - A patient with  mild systemic disease. After reviewing the risks                            and benefits, the patient was deemed in                            satisfactory condition to undergo the procedure.                           After obtaining informed consent, the colonoscope                            was passed under direct vision. Throughout the                            procedure, the patient's blood pressure,  pulse, and                            oxygen saturations were monitored continuously. The                            Olympus [id number] [id number]) [id number] was introduced                            through the anus and advanced to the cecum,                            identified by appendiceal orifice and ileocecal                            valve. The colonoscopy was performed without                            difficulty. The patient tolerated the procedure                            well. The quality of the bowel preparation was                            good. The ileocecal valve, appendiceal orifice, and                            rectum were photographed. Scope In: 10:28:39 AM Scope Out: 10:51:03 AM Scope Withdrawal Time: 0 hours 20 minutes 18 seconds  Total Procedure Duration: 0 hours 22 minutes 24 seconds  Findings:                 The digital rectal exam was normal.                           A 4 mm polyp was found in the ascending colon. The                            polyp was sessile. The polyp was removed with a  cold snare. Resection and retrieval were complete.                           Two sessile polyps were found in the distal sigmoid                            colon. The polyps were 2 to 3 mm in size. These                            polyps were removed with a cold snare. Resection                            and retrieval were complete.                           Multiple small and large-mouthed diverticula were                            found in the sigmoid colon, descending colon,                            hepatic flexure and ascending colon.                           The retroflexed view of the distal rectum and anal                            verge was normal and showed no anal or rectal                            abnormalities. Complications:            No immediate complications. Estimated Blood Loss:     Estimated blood loss was  minimal. Impression:               - One 4 mm polyp in the ascending colon, removed                            with a cold snare. Resected and retrieved.                           - Two 2 to 3 mm polyps in the distal sigmoid colon,                            removed with a cold snare. Resected and retrieved.                           - Moderate diverticulosis in the sigmoid colon, in                            the descending colon, at the hepatic flexure and in  the ascending colon.                           - The distal rectum and anal verge are normal on                            retroflexion view. Recommendation:           - Patient has a contact number available for                            emergencies. The signs and symptoms of potential                            delayed complications were discussed with the                            patient. Return to normal activities tomorrow.                            Written discharge instructions were provided to the                            patient.                           - Resume previous diet.                           - Continue present medications.                           - Await pathology results.                           - Repeat colonoscopy is recommended for                            surveillance. The colonoscopy date will be                            determined after pathology results from today's                            exam become available for review. Jerene Bears, MD 07/31/2020 11:07:23 AM This report has been signed electronically.

## 2020-07-31 NOTE — Progress Notes (Signed)
Called to room to assist during endoscopic procedure.  Patient ID and intended procedure confirmed with present staff. Received instructions for my participation in the procedure from the performing physician.  

## 2020-08-02 ENCOUNTER — Telehealth: Payer: Self-pay | Admitting: *Deleted

## 2020-08-02 NOTE — Telephone Encounter (Signed)
  Follow up Call-  Call back number 07/31/2020 12/16/2018 01/28/2018  Post procedure Call Back phone  # [phone] - WIFE  Permission to leave phone message Yes Yes Yes  Some recent data might be hidden     Patient questions:  Do you have a fever, pain , or abdominal swelling? No. Pain Score  0 * -c/o very mild abdominal soreness but states it not too bad and he is not concerned at this time.   Have you tolerated food without any problems? Yes.    Have you been able to return to your normal activities? Yes.    Do you have any questions about your discharge instructions: Diet   No. Medications  No. Follow up visit  No.  Do you have questions or concerns about your Care? No.  Actions: * If pain score is 4 or above: No action needed, pain <4.  Have you developed a fever since your procedure? no  2.   Have you had an respiratory symptoms (SOB or cough) since your procedure? no  3.   Have you tested positive for COVID 19 since your procedure no  4.   Have you had any family members/close contacts diagnosed with the COVID 19 since your procedure?  no   If yes to any of these questions please route to Joylene John, RN and Joella Prince, RN

## 2020-08-05 ENCOUNTER — Encounter: Payer: Self-pay | Admitting: Internal Medicine

## 2020-08-14 NOTE — Telephone Encounter (Signed)
Recall colonoscopy date has been changed to 2025 in [place].  From: Jerene Bears, MD Sent: 08/14/2020   1:43 PM EDT To: Larina Bras, CMA  Please change to a 3-year recall.  This will cancel the recall for 2027. New recall should be same month 2025 See mychart comment back to patient if questions Thanks JMP

## 2020-08-15 ENCOUNTER — Encounter: Payer: 59 | Admitting: Internal Medicine

## 2020-08-19 ENCOUNTER — Other Ambulatory Visit: Payer: Self-pay | Admitting: Family

## 2020-08-19 DIAGNOSIS — G8929 Other chronic pain: Secondary | ICD-10-CM

## 2020-08-31 ENCOUNTER — Telehealth: Payer: Self-pay | Admitting: Internal Medicine

## 2020-08-31 MED ORDER — CIPROFLOXACIN HCL 500 MG PO TABS: 500.0000 mg | ORAL_TABLET | Freq: Two times a day (BID) | ORAL | 0 refills | Status: DC

## 2020-08-31 MED ORDER — METRONIDAZOLE 500 MG PO TABS: 500.0000 mg | ORAL_TABLET | Freq: Two times a day (BID) | ORAL | 0 refills | Status: DC

## 2020-08-31 NOTE — Telephone Encounter (Signed)
Scripts sent to pharmacy and pt knows to call back if he does not start to improve or worsens.

## 2020-08-31 NOTE — Telephone Encounter (Signed)
Based on clinical history, reasonable to attempt antibiotic therapy for presumed diverticulitis. If he worsens then needs to call [facility] this weekend and be prepared for need for further workup/evaluation. Proceed with Ciprofloxacin 500 mg BID x 10-days + Flagyl 500 mg BID x 10-days. Call early next week to check in on patient please. Thank you. GM

## 2020-08-31 NOTE — Telephone Encounter (Signed)
Pyrtle pt with h/o diverticulitis calling. His last flare was in April, he had a CT of A/P done at that time, had colon done in June. Reports since having the colon done when he sleeps on his side he has been feeling pressure below his rib cage. Reports yesterday he started having discomfort below his belt line and when he pushes on a spot in his LLQ the pain goes all the way across his abd. Reports his wife told him he felt warm last night but he does not today. Pt requesting Cipro and Flagyl that he has had in the past to tx the flares. As DOD please advise.

## 2020-08-31 NOTE — Telephone Encounter (Signed)
Patient calling to inform he is experiencing diverticulitis flare up with some pain/diarrhea.. Pt wants to know how to treat sxs.. Plz advise  thank you

## 2020-09-14 ENCOUNTER — Encounter: Payer: Self-pay | Admitting: *Deleted

## 2020-10-03 ENCOUNTER — Telehealth: Payer: Self-pay | Admitting: Internal Medicine

## 2020-10-03 ENCOUNTER — Other Ambulatory Visit: Payer: Self-pay

## 2020-10-03 MED ORDER — CIPROFLOXACIN HCL 500 MG PO TABS: 500.0000 mg | ORAL_TABLET | Freq: Two times a day (BID) | ORAL | 0 refills | Status: DC

## 2020-10-03 MED ORDER — METRONIDAZOLE 250 MG PO TABS: 250.0000 mg | ORAL_TABLET | Freq: Three times a day (TID) | ORAL | 0 refills | Status: DC

## 2020-10-03 NOTE — Telephone Encounter (Signed)
Prescriptions sent to pharmacy. Pt made aware

## 2020-10-03 NOTE — Telephone Encounter (Signed)
Inbound call from patient requesting a call from a nurse please.  States has been experiencing LUQ pain.

## 2020-10-03 NOTE — Telephone Encounter (Signed)
Pt stating that he is experiencing 2/10 pain in his LUQ. Pt states that he feels this mostly when he is lying down on his left; or when pressure is applied. States that pain is a dull pain.  Pt is requesting the Cipro and Flagyl prescriptions.  Please advise.

## 2020-10-03 NOTE — Telephone Encounter (Signed)
Hx of diverticulitis, well known to me Cipro 500 mg bid and metronidazole 250 mg tid x 7 days

## 2020-12-13 ENCOUNTER — Encounter: Payer: Self-pay | Admitting: Nurse Practitioner

## 2020-12-13 ENCOUNTER — Ambulatory Visit: Payer: 59 | Admitting: Nurse Practitioner

## 2020-12-13 ENCOUNTER — Other Ambulatory Visit: Payer: Self-pay

## 2020-12-13 VITALS — BP 138/92 | HR 75 | Temp 98.4°F | Resp 20 | Ht 68.0 in | Wt 187.0 lb

## 2020-12-13 DIAGNOSIS — I10 Essential (primary) hypertension: Secondary | ICD-10-CM

## 2020-12-13 MED ORDER — HYDROCHLOROTHIAZIDE 25 MG PO TABS: 25.0000 mg | ORAL_TABLET | Freq: Every day | ORAL | 3 refills | Status: DC

## 2020-12-13 NOTE — Progress Notes (Signed)
   Subjective:    Patient ID: Carl Mccullough, male    DOB: [birth year], 66 y.o.   MRN: [id number]  Chief Complaint: Blood pressure running high (Took 1/2 of wifes BP med Amlodipine 10mg )   HPI Patient come sin today c/o elevated blood pressure. He has been checking it at home and it has been running around 160/112. He says his face has been feeling tingling. He took 1/2 of wifes amlodipine last night. He is not on any blood pressure medication. BP Readings from Last 3 Encounters:  12/13/20 (!) 138/92  07/31/20 130/82  05/26/20 (!) 147/94        Review of Systems  Constitutional:  Negative for diaphoresis.  Eyes:  Negative for pain.  Respiratory:  Negative for shortness of breath.   Cardiovascular:  Negative for chest pain, palpitations and leg swelling.  Gastrointestinal:  Negative for abdominal pain.  Endocrine: Negative for polydipsia.  Skin:  Negative for rash.  Neurological:  Negative for dizziness, weakness and headaches.  Hematological:  Does not bruise/bleed easily.  All other systems reviewed and are negative.     Objective:   Physical Exam Constitutional:      Appearance: Normal appearance. He is obese.  Cardiovascular:     Rate and Rhythm: Normal rate and regular rhythm.     Heart sounds: Normal heart sounds.  Pulmonary:     Effort: Pulmonary effort is normal.     Breath sounds: Normal breath sounds.  Skin:    General: Skin is warm.  Neurological:     General: No focal deficit present.     Mental Status: He is alert and oriented to person, place, and time.  Psychiatric:        Behavior: Behavior normal.    BP (!) 138/92   Pulse 75   Temp 98.4 F (36.9 C) (Temporal)   Resp 20   Ht 5\' 8"  (1.727 m)   Wt 187 lb (84.8 kg)   SpO2 99%   BMI 28.43 kg/m        Assessment & Plan:  Carl Mccullough in today with chief complaint of Blood pressure running high (Took 1/2 of wifes BP med Amlodipine 10mg )   1. Essential hypertension Added HCTZ Avoid adding  salt to diet Keep diary of blood pressure RTO in 1 month recheck - hydrochlorothiazide (HYDRODIURIL) 25 MG tablet; Take 1 tablet (25 mg total) by mouth daily.  Dispense: 90 tablet; Refill: 3    The above assessment and management plan was discussed with the patient. The patient verbalized understanding of and has agreed to the management plan. Patient is aware to call the clinic if symptoms persist or worsen. Patient is aware when to return to the clinic for a follow-up visit. Patient educated on when it is appropriate to go to the emergency department.   Mary-Margaret Hassell Done, FNP

## 2020-12-13 NOTE — Progress Notes (Signed)
Pt called that [facility] not received ref[facility], [facility] not [facility], they have been having issues with their system. Refill resent

## 2020-12-13 NOTE — Patient Instructions (Signed)

## 2020-12-13 NOTE — Addendum Note (Signed)
Addended by: Antonietta Barcelona D on: 12/13/2020 02:31 PM   Modules accepted: Orders

## 2021-01-07 ENCOUNTER — Encounter: Payer: Self-pay | Admitting: Nurse Practitioner

## 2021-01-07 ENCOUNTER — Ambulatory Visit: Payer: 59 | Admitting: Nurse Practitioner

## 2021-01-07 ENCOUNTER — Other Ambulatory Visit: Payer: Self-pay

## 2021-01-07 VITALS — BP 131/86 | HR 86 | Temp 98.3°F | Resp 20 | Ht 68.0 in | Wt 193.0 lb

## 2021-01-07 DIAGNOSIS — N5201 Erectile dysfunction due to arterial insufficiency: Secondary | ICD-10-CM

## 2021-01-07 DIAGNOSIS — E559 Vitamin D deficiency, unspecified: Secondary | ICD-10-CM

## 2021-01-07 DIAGNOSIS — Z6828 Body mass index (BMI) 28.0-28.9, adult: Secondary | ICD-10-CM

## 2021-01-07 DIAGNOSIS — E782 Mixed hyperlipidemia: Secondary | ICD-10-CM

## 2021-01-07 DIAGNOSIS — Z23 Encounter for immunization: Secondary | ICD-10-CM

## 2021-01-07 DIAGNOSIS — K5730 Diverticulosis of large intestine without perforation or abscess without bleeding: Secondary | ICD-10-CM

## 2021-01-07 DIAGNOSIS — I10 Essential (primary) hypertension: Secondary | ICD-10-CM

## 2021-01-07 DIAGNOSIS — J069 Acute upper respiratory infection, unspecified: Secondary | ICD-10-CM

## 2021-01-07 LAB — CMP14+EGFR
ALT: 23 IU/L (ref 0–44)
AST: 21 IU/L (ref 0–40)
Albumin/Globulin Ratio: 1.8 (ref 1.2–2.2)
Albumin: 4.8 g/dL (ref 3.8–4.8)
Alkaline Phosphatase: 111 IU/L (ref 44–121)
BUN/Creatinine Ratio: 8 — ABNORMAL LOW (ref 10–24)
BUN: 8 mg/dL (ref 8–27)
Bilirubin Total: 0.4 mg/dL (ref 0.0–1.2)
CO2: 25 mmol/L (ref 20–29)
Calcium: 10 mg/dL (ref 8.6–10.2)
Chloride: 94 mmol/L — ABNORMAL LOW (ref 96–106)
Creatinine, Ser: 1.06 mg/dL (ref 0.76–1.27)
Globulin, Total: 2.7 g/dL (ref 1.5–4.5)
Glucose: 95 mg/dL (ref 70–99)
Potassium: 5.4 mmol/L — ABNORMAL HIGH (ref 3.5–5.2)
Sodium: 131 mmol/L — ABNORMAL LOW (ref 134–144)
Total Protein: 7.5 g/dL (ref 6.0–8.5)
eGFR: 77 mL/min/{1.73_m2} (ref >59)

## 2021-01-07 LAB — CBC WITH DIFFERENTIAL/PLATELET
Basophils Absolute: 0 10*3/uL (ref 0.0–0.2)
Basos: 1 %
EOS (ABSOLUTE): 0.1 10*3/uL (ref 0.0–0.4)
Eos: 2 %
Hematocrit: 48.1 % (ref 37.5–51.0)
Hemoglobin: 16.9 g/dL (ref 13.0–17.7)
Immature Grans (Abs): 0 10*3/uL (ref 0.0–0.1)
Immature Granulocytes: 0 %
Lymphocytes Absolute: 2 10*3/uL (ref 0.7–3.1)
Lymphs: 37 %
MCH: 32.8 pg (ref 26.6–33.0)
MCHC: 35.1 g/dL (ref 31.5–35.7)
MCV: 93 fL (ref 79–97)
Monocytes Absolute: 0.5 10*3/uL (ref 0.1–0.9)
Monocytes: 9 %
Neutrophils Absolute: 2.8 10*3/uL (ref 1.4–7.0)
Neutrophils: 51 %
Platelets: 291 10*3/uL (ref 150–450)
RBC: 5.15 x10E6/uL (ref 4.14–5.80)
RDW: 12.2 % (ref 11.6–15.4)
WBC: 5.5 10*3/uL (ref 3.4–10.8)

## 2021-01-07 LAB — LIPID PANEL
Chol/HDL Ratio: 2.7 ratio (ref 0.0–5.0)
Cholesterol, Total: 181 mg/dL (ref 100–199)
HDL: 66 mg/dL (ref >39)
LDL Chol Calc (NIH): 98 mg/dL (ref 0–99)
Triglycerides: 97 mg/dL (ref 0–149)
VLDL Cholesterol Cal: 17 mg/dL (ref 5–40)

## 2021-01-07 MED ORDER — TADALAFIL 20 MG PO TABS: 10.0000 mg | ORAL_TABLET | ORAL | 11 refills | Status: DC | PRN

## 2021-01-07 MED ORDER — VITAMIN D (ERGOCALCIFEROL) 1.25 MG (50000 UNIT) PO CAPS: 50000.0000 [IU] | ORAL_CAPSULE | ORAL | 3 refills | Status: DC

## 2021-01-07 MED ORDER — FLUTICASONE PROPIONATE 50 MCG/ACT NA SUSP: 2.0000 | Freq: Every day | NASAL | 6 refills | Status: DC

## 2021-01-07 MED ORDER — LISINOPRIL-HYDROCHLOROTHIAZIDE 10-12.5 MG PO TABS: 1.0000 | ORAL_TABLET | Freq: Every day | ORAL | 1 refills | Status: DC

## 2021-01-07 NOTE — Progress Notes (Signed)
Subjective:    Patient ID: Carl Mccullough, male    DOB: [birth year], 65 y.o.   MRN: [id number]   Chief Complaint: medical management of chronic issues     HPI:  1. Primary hypertension No c/o chest pain, sob or headache. Does not check blood pressure at home. He was seen a month ago with elevated blood pressure and we added HCTZ to his meds. He has been keeping a diary of blood pressure at home and they have been running 016-010 systolic. BP Readings from Last 3 Encounters:  01/07/21 131/86  12/13/20 (!) 138/92  07/31/20 130/82     2. Mixed hyperlipidemia Does try to watch diet but does not do much dedicated exercise. Lab Results  Component Value Date   CHOL 178 02/07/2020   HDL 72 02/07/2020   LDLCALC 95 02/07/2020   TRIG 56 02/07/2020   CHOLHDL 2.5 02/07/2020     3. Erectile dysfunction due to arterial insufficiency Uses cialis daily and works well for him.  4. Diverticulosis of colon without hemorrhage Denis any recent flare ups.  5. Vitamin D deficiency Is on daily vitamin d supplement  6. BMI 28.0-28.9,adult No recent weight changes Wt Readings from Last 3 Encounters:  12/13/20 187 lb (84.8 kg)  07/31/20 185 lb (83.9 kg)  07/12/20 185 lb (83.9 kg)   BMI Readings from Last 3 Encounters:  12/13/20 28.43 kg/m  07/31/20 27.72 kg/m  07/12/20 27.72 kg/m       Outpatient Encounter Medications as of 01/07/2021  Medication Sig   azelastine (ASTELIN) 0.1 % nasal spray Place 1 spray into both nostrils 2 (two) times daily. (Patient not taking: No sig reported)   cetirizine (ZYRTEC) 10 MG tablet Take 1 tablet (10 mg total) by mouth daily.   fluticasone (FLONASE) 50 MCG/ACT nasal spray Place 2 sprays into both nostrils daily. (Patient not taking: No sig reported)   hydrochlorothiazide (HYDRODIURIL) 25 MG tablet Take 1 tablet (25 mg total) by mouth daily.   meloxicam (MOBIC) 15 MG tablet TAKE 1 TABLET (15 MG TOTAL) BY MOUTH DAILY.   tadalafil (CIALIS) 20 MG  tablet Take 0.5-1 tablets (10-20 mg total) by mouth every other day as needed for erectile dysfunction.   Vitamin D, Ergocalciferol, (DRISDOL) 1.25 MG (50000 UNIT) CAPS capsule Take 1 capsule (50,000 Units total) by mouth every 7 (seven) days.   No facility-administered encounter medications on file as of 01/07/2021.    Past Surgical History:  Procedure Laterality Date   CYSTOSCOPY  [id number]   INGUINAL HERNIA REPAIR N/A 06/02/2019   Procedure: LAPAROSCOPIC BILATERAL INGUINAL HERNIA REPAIR, PRIMARY UMBILICAL HERNIA REPAIR;  Surgeon: Michael Boston, MD;  Location: [place] [facility];  Service: General;  Laterality: N/A;  GENERAL WITH ERAS   neck stitches  yrs ago   cut neck with chainsaw   TONSILLECTOMY      Family History  Problem Relation Age of Onset   Hypertension Mother    Alzheimer's disease Mother    Chronic Renal Failure Mother    Colon cancer Father    Bladder Cancer Father    Hypertension Brother    Rectal cancer Neg Hx    Stomach cancer Neg Hx    Esophageal cancer Neg Hx    Colon polyps Neg Hx     New complaints: None today  Social history: Lives with his wife. He wants to retire.  Controlled substance contract: n/a     Review of Systems  Constitutional:  Negative for diaphoresis.  Eyes:  Negative for pain.  Respiratory:  Negative for shortness of breath.   Cardiovascular:  Negative for chest pain, palpitations and leg swelling.  Gastrointestinal:  Negative for abdominal pain.  Endocrine: Negative for polydipsia.  Skin:  Negative for rash.  Neurological:  Negative for dizziness, weakness and headaches.  Hematological:  Does not bruise/bleed easily.  All other systems reviewed and are negative.     Objective:   Physical Exam Vitals and nursing note reviewed.  Constitutional:      Appearance: Normal appearance. He is well-developed.  HENT:     Head: Normocephalic.     Nose: Nose normal.  Eyes:     Pupils: Pupils are equal, round, and  reactive to light.  Neck:     Thyroid: No thyroid mass or thyromegaly.     Vascular: No carotid bruit or JVD.     Trachea: Phonation normal.  Cardiovascular:     Rate and Rhythm: Normal rate and regular rhythm.  Pulmonary:     Effort: Pulmonary effort is normal. No respiratory distress.     Breath sounds: Normal breath sounds.  Abdominal:     General: Bowel sounds are normal.     Palpations: Abdomen is soft.     Tenderness: There is no abdominal tenderness.  Musculoskeletal:        General: Normal range of motion.     Cervical back: Normal range of motion and neck supple.  Lymphadenopathy:     Cervical: No cervical adenopathy.  Skin:    General: Skin is warm and dry.  Neurological:     Mental Status: He is alert and oriented to person, place, and time.  Psychiatric:        Behavior: Behavior normal.        Thought Content: Thought content normal.        Judgment: Judgment normal.    BP 131/86   Pulse 86   Temp 98.3 F (36.8 C) (Temporal)   Resp 20   Ht _0  (1.727 m)   Wt 193 lb (87.5 kg)   SpO2 99%   BMI 29.35 kg/m        Assessment & Plan:   Carl Mccullough comes in today with chief complaint of Medical Management of Chronic Issues   Diagnosis and orders addressed:  1. Primary hypertension Low sodium diet - lisinopril-hydrochlorothiazide (ZESTORETIC) 10-12.5 MG tablet; Take 1 tablet by mouth daily.  Dispense: 90 tablet; Refill: 1 - CBC with Differential/Platelet - CMP14+EGFR  2. Mixed hyperlipidemia Low fat diet - Lipid panel  3. Erectile dysfunction due to arterial insufficiency Continue cialis as prescribed -tadalafil (CIALIS) 20 MG tablet; Take 0.5-1 tablets (10-20 mg total) by mouth every other day as needed for erec-ile dysfunction.  Dispense: 5 tablet; Refill: 11  4. Diverticulosis of colon without hemorrhage Watch diet to prevent flare up  5. Vitamin D deficiency Continue daily vitamin d supplement  6. BMI 28.0-28.9,adult Discussed diet  and exercise for person with BMI >25 Will recheck weight in 3-6 months  7. Viral URI Proper use of nasal spray discussed. - fluticasone (FLONASE) 50 MCG/ACT nasal spray; Place 2 sprays into both nostrils daily.  Dispense: 16 g; Refill: 6   Labs pending Health Maintenance reviewed Diet and exercise encouraged  Follow up plan: 6 months   Mary-Margaret Hassell Done, FNP

## 2021-01-07 NOTE — Patient Instructions (Signed)

## 2021-01-08 ENCOUNTER — Ambulatory Visit: Payer: 59 | Admitting: Nurse Practitioner

## 2021-02-08 ENCOUNTER — Ambulatory Visit (INDEPENDENT_AMBULATORY_CARE_PROVIDER_SITE_OTHER): Payer: 59 | Admitting: Nurse Practitioner

## 2021-02-08 ENCOUNTER — Encounter: Payer: Self-pay | Admitting: Nurse Practitioner

## 2021-02-08 ENCOUNTER — Encounter: Payer: BC Managed Care – PPO | Admitting: Family Medicine

## 2021-02-08 VITALS — BP 132/80 | HR 76 | Temp 97.8°F | Resp 20 | Ht 68.0 in | Wt 193.0 lb

## 2021-02-08 DIAGNOSIS — Z0000 Encounter for general adult medical examination without abnormal findings: Secondary | ICD-10-CM

## 2021-02-08 DIAGNOSIS — J3089 Other allergic rhinitis: Secondary | ICD-10-CM | POA: Diagnosis not present

## 2021-02-08 DIAGNOSIS — Z0001 Encounter for general adult medical examination with abnormal findings: Secondary | ICD-10-CM

## 2021-02-08 DIAGNOSIS — I10 Essential (primary) hypertension: Secondary | ICD-10-CM | POA: Diagnosis not present

## 2021-02-08 DIAGNOSIS — N400 Enlarged prostate without lower urinary tract symptoms: Secondary | ICD-10-CM | POA: Diagnosis not present

## 2021-02-08 DIAGNOSIS — E782 Mixed hyperlipidemia: Secondary | ICD-10-CM

## 2021-02-08 DIAGNOSIS — K5730 Diverticulosis of large intestine without perforation or abscess without bleeding: Secondary | ICD-10-CM

## 2021-02-08 DIAGNOSIS — E559 Vitamin D deficiency, unspecified: Secondary | ICD-10-CM

## 2021-02-08 DIAGNOSIS — Z6828 Body mass index (BMI) 28.0-28.9, adult: Secondary | ICD-10-CM

## 2021-02-08 DIAGNOSIS — J312 Chronic pharyngitis: Secondary | ICD-10-CM

## 2021-02-08 DIAGNOSIS — N5201 Erectile dysfunction due to arterial insufficiency: Secondary | ICD-10-CM

## 2021-02-08 DIAGNOSIS — E349 Endocrine disorder, unspecified: Secondary | ICD-10-CM

## 2021-02-08 MED ORDER — CETIRIZINE HCL 10 MG PO TABS
10.0000 mg | ORAL_TABLET | Freq: Every day | ORAL | 11 refills | Status: DC
Start: 2021-02-08 — End: 2022-02-24

## 2021-02-08 MED ORDER — LISINOPRIL-HYDROCHLOROTHIAZIDE 10-12.5 MG PO TABS: 1.0000 | ORAL_TABLET | Freq: Every day | ORAL | 1 refills | Status: DC

## 2021-02-08 MED ORDER — TADALAFIL 20 MG PO TABS: 10.0000 mg | ORAL_TABLET | ORAL | 11 refills | Status: DC | PRN

## 2021-02-08 MED ORDER — FLUTICASONE PROPIONATE 50 MCG/ACT NA SUSP: 2.0000 | Freq: Every day | NASAL | 6 refills | Status: DC

## 2021-02-08 NOTE — Progress Notes (Signed)
Subjective:    Patient ID: Carl Mccullough, male    DOB: 1955/08/06, 65 y.o.   MRN: [id number]  Chief Complaint: Annual Exam    HPI:  Carl Mccullough is a 65 y.o. who identifies as a male who was assigned male at birth.   Social history: Lives with: wife Work history: [employer] employee   Comes in today for follow up of the following chronic medical issues:  1. Annual physical exam  2. Mixed hyperlipidemia Does try to watch diet and does very little exercise. Lab Results  Component Value Date   CHOL 181 01/07/2021   HDL 66 01/07/2021   LDLCALC 98 01/07/2021   TRIG 97 01/07/2021   CHOLHDL 2.7 01/07/2021     3. Non-seasonal allergic rhinitis, unspecified trigger Is on zyrtec daily and is doing well.  4. Benign prostatic hyperplasia without lower urinary tract symptoms Is on no meds. Denies any problems voiding Lab Results  Component Value Date   PSA1 1.2 02/07/2020   PSA1 0.8 12/22/2017   PSA1 1.0 12/19/2016   PSA 0.9 09/29/2013      5. Diverticulosis of colon without hemorrhage Denies any recent flare ups. Watches diet to prevent flare up  6. Testosterone deficiency Is on no testosterone  7. Vitamin D deficiency Is on daily vitamin d supplement  8. Erectile dysfunction Cialis works well when needed   9. BMI 28.0-28.9,adult No recent weight changes Wt Readings from Last 3 Encounters:  02/08/21 193 lb (87.5 kg)  01/07/21 193 lb (87.5 kg)  12/13/20 187 lb (84.8 kg)   BMI Readings from Last 3 Encounters:  02/08/21 29.35 kg/m  01/07/21 29.35 kg/m  12/13/20 28.43 kg/m     New complaints: Has had a sore throat for 4 months. He has seen ENT in the past and they told him he had gastric reflux. But GI did endoscopy and told him he saw no sign of gastric reflux.  No Known Allergies Outpatient Encounter Medications as of 02/08/2021  Medication Sig   Ascorbic Acid (VITAMIN C) 1000 MG tablet Take 1,000 mg by mouth daily.   cetirizine (ZYRTEC) 10 MG  tablet Take 1 tablet (10 mg total) by mouth daily. (Patient not taking: Reported on 01/07/2021)   fluticasone (FLONASE) 50 MCG/ACT nasal spray Place 2 sprays into both nostrils daily.   lisinopril-hydrochlorothiazide (ZESTORETIC) 10-12.5 MG tablet Take 1 tablet by mouth daily.   meloxicam (MOBIC) 15 MG tablet TAKE 1 TABLET (15 MG TOTAL) BY MOUTH DAILY. (Patient not taking: Reported on 01/07/2021)   tadalafil (CIALIS) 20 MG tablet Take 0.5-1 tablets (10-20 mg total) by mouth every other day as needed for erectile dysfunction.   Vitamin D, Ergocalciferol, (DRISDOL) 1.25 MG (50000 UNIT) CAPS capsule Take 1 capsule (50,000 Units total) by mouth every 7 (seven) days.   No facility-administered encounter medications on file as of 02/08/2021.    Past Surgical History:  Procedure Laterality Date   CYSTOSCOPY  [id number]   INGUINAL HERNIA REPAIR N/A 06/02/2019   Procedure: LAPAROSCOPIC BILATERAL INGUINAL HERNIA REPAIR, PRIMARY UMBILICAL HERNIA REPAIR;  Surgeon: Michael Boston, MD;  Location: [place] [facility];  Service: General;  Laterality: N/A;  GENERAL WITH ERAS   neck stitches  yrs ago   cut neck with chainsaw   TONSILLECTOMY      Family History  Problem Relation Age of Onset   Hypertension Mother    Alzheimer's disease Mother    Chronic Renal Failure Mother    Colon cancer Father  Bladder Cancer Father    Hypertension Brother    Rectal cancer Neg Hx    Stomach cancer Neg Hx    Esophageal cancer Neg Hx    Colon polyps Neg Hx       Controlled substance contract: n/a     Review of Systems  Constitutional:  Negative for diaphoresis.  Eyes:  Negative for pain.  Respiratory:  Negative for shortness of breath.   Cardiovascular:  Negative for chest pain, palpitations and leg swelling.  Gastrointestinal:  Negative for abdominal pain.  Endocrine: Negative for polydipsia.  Skin:  Negative for rash.  Neurological:  Negative for dizziness, weakness and headaches.   Hematological:  Does not bruise/bleed easily.  All other systems reviewed and are negative.     Objective:   Physical Exam Vitals and nursing note reviewed.  Constitutional:      Appearance: Normal appearance. He is well-developed.  HENT:     Head: Normocephalic.     Nose: Nose normal.     Mouth/Throat:     Mouth: Mucous membranes are moist.     Pharynx: Oropharynx is clear.  Eyes:     Pupils: Pupils are equal, round, and reactive to light.  Neck:     Thyroid: No thyroid mass or thyromegaly.     Vascular: No carotid bruit or JVD.     Trachea: Phonation normal.  Cardiovascular:     Rate and Rhythm: Normal rate and regular rhythm.  Pulmonary:     Effort: Pulmonary effort is normal. No respiratory distress.     Breath sounds: Normal breath sounds.  Abdominal:     General: Bowel sounds are normal.     Palpations: Abdomen is soft.     Tenderness: There is no abdominal tenderness.  Musculoskeletal:        General: Normal range of motion.     Cervical back: Normal range of motion and neck supple.  Lymphadenopathy:     Cervical: No cervical adenopathy.  Skin:    General: Skin is warm and dry.  Neurological:     Mental Status: He is alert and oriented to person, place, and time.  Psychiatric:        Behavior: Behavior normal.        Thought Content: Thought content normal.        Judgment: Judgment normal.    BP 132/80    Pulse 76    Temp 97.8 F (36.6 C) (Temporal)    Resp 20    Ht 5' 8"  (1.727 m)    Wt 193 lb (87.5 kg)    SpO2 99%    BMI 29.35 kg/m    EKG- Kerry Hough, FNP      Assessment & Plan:  MAHARI STRAHM comes in today with chief complaint of Annual Exam   Diagnosis and orders addressed:  1. Annual physical exam - EKG 12-Lead - DG Chest 2 View  2. Mixed hyperlipidemia Low fat diet - CBC with Differential/Platelet - CMP14+EGFR - Lipid panel  3. Non-seasonal allergic rhinitis, unspecified trigger Continue zyrtec and flonase -  fluticasone (FLONASE) 50 MCG/ACT nasal spray; Place 2 sprays into both nostrils daily.  Dispense: 16 g; Refill: 6 - cetirizine (ZYRTEC) 10 MG tablet; Take 1 tablet (10 mg total) by mouth daily.  Dispense: 30 tablet; Refill: 11  4. Benign prostatic hyperplasia without lower urinary tract symptoms Report any problems voiding - PSA, total and free  5. Diverticulosis of colon without hemorrhage Continue to watch diet to  prevent flare up  6. Testosterone deficiency Labs pending - Testosterone,Free and Total  7. Vitamin D deficiency Daily vitamin d supplement  8. BMI 28.0-28.9,adult Discussed diet and exercise for person with BMI >25 Will recheck weight in 3-6 months   9. Chronic pharyngitis Referral to ENT - Ambulatory referral to ENT  10. Erectile dysfunction due to arterial insufficiency - tadalafil (CIALIS) 20 MG tablet; Take 0.5-1 tablets (10-20 mg total) by mouth every other day as needed for erectile dysfunction.  Dispense: 5 tablet; Refill: 11  11. Primary hypertension Low sodium diet - lisinopril-hydrochlorothiazide (ZESTORETIC) 10-12.5 MG tablet; Take 1 tablet by mouth daily.  Dispense: 90 tablet; Refill: 1   Labs pending Health Maintenance reviewed Diet and exercise encouraged  Follow up plan: 6 months   Mary-Margaret Hassell Done, FNP

## 2021-02-12 LAB — CMP14+EGFR
ALT: 28 IU/L (ref 0–44)
AST: 27 IU/L (ref 0–40)
Albumin/Globulin Ratio: 1.8 (ref 1.2–2.2)
Albumin: 4.7 g/dL (ref 3.8–4.8)
Alkaline Phosphatase: 100 IU/L (ref 44–121)
BUN/Creatinine Ratio: 11 (ref 10–24)
BUN: 10 mg/dL (ref 8–27)
Bilirubin Total: 0.4 mg/dL (ref 0.0–1.2)
CO2: 23 mmol/L (ref 20–29)
Calcium: 9.9 mg/dL (ref 8.6–10.2)
Chloride: 94 mmol/L — ABNORMAL LOW (ref 96–106)
Creatinine, Ser: 0.94 mg/dL (ref 0.76–1.27)
Globulin, Total: 2.6 g/dL (ref 1.5–4.5)
Glucose: 93 mg/dL (ref 70–99)
Potassium: 4.8 mmol/L (ref 3.5–5.2)
Sodium: 132 mmol/L — ABNORMAL LOW (ref 134–144)
Total Protein: 7.3 g/dL (ref 6.0–8.5)
eGFR: 89 mL/min/{1.73_m2} (ref >59)

## 2021-02-12 LAB — CBC WITH DIFFERENTIAL/PLATELET
Basophils Absolute: 0 10*3/uL (ref 0.0–0.2)
Basos: 1 %
EOS (ABSOLUTE): 0.1 10*3/uL (ref 0.0–0.4)
Eos: 2 %
Hematocrit: 45.8 % (ref 37.5–51.0)
Hemoglobin: 16 g/dL (ref 13.0–17.7)
Immature Grans (Abs): 0 10*3/uL (ref 0.0–0.1)
Immature Granulocytes: 0 %
Lymphocytes Absolute: 2.1 10*3/uL (ref 0.7–3.1)
Lymphs: 40 %
MCH: 32.5 pg (ref 26.6–33.0)
MCHC: 34.9 g/dL (ref 31.5–35.7)
MCV: 93 fL (ref 79–97)
Monocytes Absolute: 0.5 10*3/uL (ref 0.1–0.9)
Monocytes: 9 %
Neutrophils Absolute: 2.6 10*3/uL (ref 1.4–7.0)
Neutrophils: 48 %
Platelets: 277 10*3/uL (ref 150–450)
RBC: 4.92 x10E6/uL (ref 4.14–5.80)
RDW: 11.5 % — ABNORMAL LOW (ref 11.6–15.4)
WBC: 5.4 10*3/uL (ref 3.4–10.8)

## 2021-02-12 LAB — PSA, TOTAL AND FREE
PSA, Free Pct: 39 %
PSA, Free: 0.39 ng/mL
Prostate Specific Ag, Serum: 1 ng/mL (ref 0.0–4.0)

## 2021-02-12 LAB — LIPID PANEL
Chol/HDL Ratio: 2.5 ratio (ref 0.0–5.0)
Cholesterol, Total: 166 mg/dL (ref 100–199)
HDL: 66 mg/dL (ref >39)
LDL Chol Calc (NIH): 84 mg/dL (ref 0–99)
Triglycerides: 87 mg/dL (ref 0–149)
VLDL Cholesterol Cal: 16 mg/dL (ref 5–40)

## 2021-02-12 LAB — TESTOSTERONE,FREE AND TOTAL
Testosterone, Free: 8.2 pg/mL (ref 6.6–18.1)
Testosterone: 403 ng/dL (ref 264–916)

## 2021-02-13 ENCOUNTER — Encounter: Payer: 59 | Admitting: Family Medicine

## 2021-02-15 ENCOUNTER — Other Ambulatory Visit (INDEPENDENT_AMBULATORY_CARE_PROVIDER_SITE_OTHER): Payer: 59

## 2021-02-15 DIAGNOSIS — I10 Essential (primary) hypertension: Secondary | ICD-10-CM

## 2021-02-15 DIAGNOSIS — Z0001 Encounter for general adult medical examination with abnormal findings: Secondary | ICD-10-CM | POA: Diagnosis not present

## 2021-02-27 ENCOUNTER — Telehealth: Payer: Self-pay | Admitting: Internal Medicine

## 2021-02-27 NOTE — Telephone Encounter (Signed)
Pt reports he started having bad pain below his belt line in the center of his abdomen last night. Reports he had a low grade temp of 100. He had 4 pills left from his last bout with diverticulitis and states he feels some better today. He is calling to get new prescriptions for his flare. Please advise.

## 2021-02-27 NOTE — Telephone Encounter (Signed)
Inbound call from patient stating he believes he is having a diverticulitis flare-up and is wanting to know if he can have medication prescribed for it.  Please advise.

## 2021-02-27 NOTE — Telephone Encounter (Signed)
Cipro 500 mg twice daily and metronidazole 250 mg 3 times daily x7 days

## 2021-02-28 MED ORDER — CIPROFLOXACIN HCL 500 MG PO TABS: 500.0000 mg | ORAL_TABLET | Freq: Two times a day (BID) | ORAL | 0 refills | Status: DC

## 2021-02-28 MED ORDER — METRONIDAZOLE 250 MG PO TABS: 250.0000 mg | ORAL_TABLET | Freq: Three times a day (TID) | ORAL | 0 refills | Status: DC

## 2021-02-28 NOTE — Telephone Encounter (Signed)
Scripts sent to pharmacy and pt aware. 

## 2021-03-01 MED ORDER — CIPROFLOXACIN HCL 500 MG PO TABS: 500.0000 mg | ORAL_TABLET | Freq: Two times a day (BID) | ORAL | 0 refills | Status: DC

## 2021-03-01 MED ORDER — METRONIDAZOLE 250 MG PO TABS: 250.0000 mg | ORAL_TABLET | Freq: Three times a day (TID) | ORAL | 0 refills | Status: DC

## 2021-03-01 NOTE — Addendum Note (Signed)
Addended by: Yevette Edwards on: 03/01/2021 02:51 PM   Modules accepted: Orders

## 2021-03-01 NOTE — Telephone Encounter (Signed)
Patient called states the pharmacy told him their system was down yesterday so we would need to resend the medications for Cipro and Flagyl.

## 2021-03-01 NOTE — Telephone Encounter (Signed)
Returned call to patient. He is aware that we have the confirmation that the pharmacy should have the prescriptions. I told him that we would send it again to be sure that they get it this time. Pt is aware that he should be able to pick up medications later today. Pt verbalized understanding and had no concerns at the end of the call.

## 2021-08-16 ENCOUNTER — Ambulatory Visit: Payer: 59 | Admitting: Nurse Practitioner

## 2021-08-16 ENCOUNTER — Encounter: Payer: Self-pay | Admitting: Nurse Practitioner

## 2021-08-16 VITALS — BP 118/73 | HR 65 | Temp 98.2°F | Resp 20 | Ht 68.0 in | Wt 195.0 lb

## 2021-08-16 DIAGNOSIS — N5201 Erectile dysfunction due to arterial insufficiency: Secondary | ICD-10-CM

## 2021-08-16 DIAGNOSIS — E559 Vitamin D deficiency, unspecified: Secondary | ICD-10-CM | POA: Diagnosis not present

## 2021-08-16 DIAGNOSIS — I10 Essential (primary) hypertension: Secondary | ICD-10-CM | POA: Diagnosis not present

## 2021-08-16 DIAGNOSIS — Z6828 Body mass index (BMI) 28.0-28.9, adult: Secondary | ICD-10-CM

## 2021-08-16 DIAGNOSIS — E782 Mixed hyperlipidemia: Secondary | ICD-10-CM

## 2021-08-16 DIAGNOSIS — N400 Enlarged prostate without lower urinary tract symptoms: Secondary | ICD-10-CM | POA: Diagnosis not present

## 2021-08-16 DIAGNOSIS — K5730 Diverticulosis of large intestine without perforation or abscess without bleeding: Secondary | ICD-10-CM

## 2021-08-16 DIAGNOSIS — E349 Endocrine disorder, unspecified: Secondary | ICD-10-CM

## 2021-08-16 MED ORDER — LISINOPRIL-HYDROCHLOROTHIAZIDE 10-12.5 MG PO TABS: 1.0000 | ORAL_TABLET | Freq: Every day | ORAL | 1 refills | Status: DC

## 2021-08-16 MED ORDER — TADALAFIL 20 MG PO TABS: 10.0000 mg | ORAL_TABLET | ORAL | 11 refills | Status: DC | PRN

## 2021-08-16 NOTE — Progress Notes (Signed)
Subjective:    Patient ID: Carl Mccullough, male    DOB: [birth year], 66 y.o.   MRN: [id number]   Chief Complaint: Medical Management of Chronic Issues    HPI:  Carl Mccullough is a 66 y.o. who identifies as a male who was assigned male at birth.   Social history: Lives with: wife Work history: works at [employer]- is planning on retiring in the next 2 months   Comes in today for follow up of the following chronic medical issues:  1. Primary hypertension No c/o chest pain, sob or headache. Does not check blood pressure at home. BP Readings from Last 3 Encounters:  08/16/21 118/73  02/08/21 132/80  01/07/21 131/86     2. Mixed hyperlipidemia Does not watch diet. No dedicated exercise. Lab Results  Component Value Date   CHOL 166 02/08/2021   HDL 66 02/08/2021   LDLCALC 84 02/08/2021   TRIG 87 02/08/2021   CHOLHDL 2.5 02/08/2021     3. Benign prostatic hyperplasia without lower urinary tract symptoms No voiding issues  4. Vitamin D deficiency Does take a daily vitamin d supplement  5. Diverticulosis of colon without hemorrhage No recent ly flare ups. He does avoid foods that cause his flareups.  6. Testosterone deficiency No testosterone replacements Lab Results  Component Value Date   TESTOSTERONE 403 02/08/2021     7. Erectile dysfunction due to arterial insufficiency Uses cialis as needed  8. BMI 28.0-28.9,adult No recent weight changes Wt Readings from Last 3 Encounters:  08/16/21 195 lb (88.5 kg)  02/08/21 193 lb (87.5 kg)  01/07/21 193 lb (87.5 kg)   BMI Readings from Last 3 Encounters:  08/16/21 29.65 kg/m  02/08/21 29.35 kg/m  01/07/21 29.35 kg/m      New complaints: None today  No Known Allergies Outpatient Encounter Medications as of 08/16/2021  Medication Sig   Ascorbic Acid (VITAMIN C) 1000 MG tablet Take 1,000 mg by mouth daily.   cetirizine (ZYRTEC) 10 MG tablet Take 1 tablet (10 mg total) by mouth daily.    lisinopril-hydrochlorothiazide (ZESTORETIC) 10-12.5 MG tablet Take 1 tablet by mouth daily.   tadalafil (CIALIS) 20 MG tablet Take 0.5-1 tablets (10-20 mg total) by mouth every other day as needed for erectile dysfunction.   Vitamin D, Ergocalciferol, (DRISDOL) 1.25 MG (50000 UNIT) CAPS capsule Take 1 capsule (50,000 Units total) by mouth every 7 (seven) days.   [DISCONTINUED] ciprofloxacin (CIPRO) 500 MG tablet Take 1 tablet (500 mg total) by mouth 2 (two) times daily.   [DISCONTINUED] metroNIDAZOLE (FLAGYL) 250 MG tablet Take 1 tablet (250 mg total) by mouth 3 (three) times daily.   fluticasone (FLONASE) 50 MCG/ACT nasal spray Place 2 sprays into both nostrils daily. (Patient not taking: Reported on 08/16/2021)   meloxicam (MOBIC) 15 MG tablet TAKE 1 TABLET (15 MG TOTAL) BY MOUTH DAILY. (Patient not taking: Reported on 08/16/2021)   No facility-administered encounter medications on file as of 08/16/2021.    Past Surgical History:  Procedure Laterality Date   CYSTOSCOPY  [id number]   INGUINAL HERNIA REPAIR N/A 06/02/2019   Procedure: LAPAROSCOPIC BILATERAL INGUINAL HERNIA REPAIR, PRIMARY UMBILICAL HERNIA REPAIR;  Surgeon: Michael Boston, MD;  Location: [facility];  Service: General;  Laterality: N/A;  GENERAL WITH ERAS   neck stitches  yrs ago   cut neck with chainsaw   TONSILLECTOMY      Family History  Problem Relation Age of Onset   Hypertension Mother  Alzheimer's disease Mother    Chronic Renal Failure Mother    Colon cancer Father    Bladder Cancer Father    Hypertension Brother    Rectal cancer Neg Hx    Stomach cancer Neg Hx    Esophageal cancer Neg Hx    Colon polyps Neg Hx       Controlled substance contract: n/a     Review of Systems  Constitutional:  Negative for diaphoresis.  Eyes:  Negative for pain.  Respiratory:  Negative for shortness of breath.   Cardiovascular:  Negative for chest pain, palpitations and leg swelling.  Gastrointestinal:   Negative for abdominal pain.  Endocrine: Negative for polydipsia.  Skin:  Negative for rash.  Neurological:  Negative for dizziness, weakness and headaches.  Hematological:  Does not bruise/bleed easily.  All other systems reviewed and are negative.      Objective:   Physical Exam Vitals and nursing note reviewed.  Constitutional:      Appearance: Normal appearance. He is well-developed.  HENT:     Head: Normocephalic.     Nose: Nose normal.     Mouth/Throat:     Mouth: Mucous membranes are moist.     Pharynx: Oropharynx is clear.  Eyes:     Pupils: Pupils are equal, round, and reactive to light.  Neck:     Thyroid: No thyroid mass or thyromegaly.     Vascular: No carotid bruit or JVD.     Trachea: Phonation normal.  Cardiovascular:     Rate and Rhythm: Normal rate and regular rhythm.  Pulmonary:     Effort: Pulmonary effort is normal. No respiratory distress.     Breath sounds: Normal breath sounds.  Abdominal:     General: Bowel sounds are normal.     Palpations: Abdomen is soft.     Tenderness: There is no abdominal tenderness.  Musculoskeletal:        General: Normal range of motion.     Cervical back: Normal range of motion and neck supple.  Lymphadenopathy:     Cervical: No cervical adenopathy.  Skin:    General: Skin is warm and dry.  Neurological:     Mental Status: He is alert and oriented to person, place, and time.  Psychiatric:        Behavior: Behavior normal.        Thought Content: Thought content normal.        Judgment: Judgment normal.     BP 118/73   Pulse 65   Temp 98.2 F (36.8 C) (Temporal)   Resp 20   Ht '5\' 8"'$  (1.727 m)   Wt 195 lb (88.5 kg)   SpO2 97%   BMI 29.65 kg/m        Assessment & Plan:  Carl Mccullough comes in today with chief complaint of Medical Management of Chronic Issues   Diagnosis and orders addressed:  1. Primary hypertension Low sodium diet - lisinopril-hydrochlorothiazide (ZESTORETIC) 10-12.5 MG tablet;  Take 1 tablet by mouth daily.  Dispense: 90 tablet; Refill: 1  2. Mixed hyperlipidemia Low fat diet  3. Benign prostatic hyperplasia without lower urinary tract symptoms Report any voiding issues  4. Vitamin D deficiency Continue vitamin d supplement  5. Diverticulosis of colon without hemorrhage Continue to watch diet  6. Testosterone deficiency Doe snot want testosterone replacement  7. Erectile dysfunction due to arterial insufficiency  - tadalafil (CIALIS) 20 MG tablet; Take 0.5-1 tablets (10-20 mg total) by mouth every other  day as needed for erectile dysfunction.  Dispense: 5 tablet; Refill: 11  8. BMI 28.0-28.9,adult Discussed diet and exercise for person with BMI >25 Will recheck weight in 3-6 months    Labs pending Health Maintenance reviewed Diet and exercise encouraged  Follow up plan: 6 months   Mary-Margaret Hassell Done, FNP

## 2021-08-16 NOTE — Patient Instructions (Signed)
Testosterone Replacement Therapy Testosterone replacement therapy (TRT) treats men who have a low testosterone level. Testosterone is a male hormone that is produced in the testicles. It is responsible for typical male characteristics and for maintaining a man's sex drive and ability to get an erection. Testosterone also supports bone and muscle health. Low testosterone may not need to be treated. Your health care provider may recommend TRT if you have symptoms such as a low sex drive or erection problems, weak muscles or bones, or low energy. Types of TRT  You and your health care provider will decide which form is best for you. TRT is available in the following forms: Topical gels, creams, lotions, or sprays. Do not let other people, especially women or children, come in contact with the skin where the testosterone was applied. Nasal gels. Patches. Pills. Injections into the muscle or under the skin. Long-acting pellets inserted under the skin. The amount of TRT you take and how long you take it is based on your condition. It is important to: Begin TRT with the lowest possible dosage. Stop TRT if your health care provider tells you to stop. Work with your health care provider so that you feel informed and comfortable with your decision. Tell a health care provider about: Any allergies you have. Any personal or family history of blood clots, breast cancer, or prostate cancer. If you have sleep apnea or have been told that you snore. All medicines you are taking, including vitamins, herbs, eye drops, creams, and over-the-counter medicines. Any surgeries you have had. Any other medical conditions you have. If you wish to have biological children someday. What are the benefits? Benefits of TRT vary but may include improved sexual function, muscle mass or strength, mood, or quality of life. What are the risks? Risks of TRT vary depending on your individual and medical history. Side effects  can be related to the type of TRT you choose. If you choose products that are used on the skin, you may have skin irritation. If you get injections, you may have redness and swelling at the injection site. Side effects that can happen with any form of TRT include: Lower sperm count. Acne. Swelling of your legs or feet, or tenderness in the chest or breast area. Sleep disturbances and mood swings. Enlarged prostate. Increase in your red blood count. It is unclear if testosterone increases the risk of some serious conditions. Talk with your health care provider about your risk for these conditions, including: Blood clots. Heart disease, such as stroke or heart attack. Prostate cancer. Risks of TRT may increase if you: Have had or are at risk for prostate cancer or breast cancer. Have had a previous stroke or heart attack. Have a high number of red blood cells. Have treated or untreated sleep apnea. Have a large prostate. The long-term safety of TRT is not known. Follow these instructions at home: Take over-the-counter and prescription medicines only as told by your health care provider. Lose weight if you are overweight. Ask your health care provider to help you start a healthy diet and exercise program to reach and maintain a healthy weight. Work with your health care provider to manage other medical conditions that may lower your testosterone. These include obesity, high blood pressure, high cholesterol, diabetes, liver disease, kidney disease, and sleep apnea. Do not use any testosterone replacement therapies that are not prescribed by your health care provider. Keep all follow-up visits. This is important. Where to find more information American U[employer]   Foundation: www.urologyhealth.org Endocrine Society: www.hormone.org Contact a health care provider if: You have side effects from your TRT. You have pain or swelling in your legs. You have problems urinating. You have lumps or  changes in your breasts or armpits. Get help right away if: You have shortness of breath. You have chest pain. You have slurred speech. You have weakness or numbness in any part of your arms or legs. These symptoms may represent a serious problem that is an emergency. Do not wait to see if the symptoms will go away. Get medical help right away. Call your local emergency services (911 in the [place] Do not drive yourself to the hospital. Summary Testosterone replacement therapy (TRT) is used to treat men who have a low testosterone level. TRT should only be prescribed by and under the supervision of a health care provider. Tell a health care provider about any medical conditions you have. TRT may have side effects. Talk with your health care provider about all of the risks and benefits before you start therapy. This information is not intended to replace advice given to you by your health care provider. Make sure you discuss any questions you have with your health care provider. Document Revised: 11/29/2019 Document Reviewed: 11/29/2019 Elsevier Patient Education  2023 Elsevier Inc.  

## 2021-08-17 LAB — LIPID PANEL
Chol/HDL Ratio: 2.6 ratio (ref 0.0–5.0)
Cholesterol, Total: 167 mg/dL (ref 100–199)
HDL: 65 mg/dL (ref >39)
LDL Chol Calc (NIH): 87 mg/dL (ref 0–99)
Triglycerides: 81 mg/dL (ref 0–149)
VLDL Cholesterol Cal: 15 mg/dL (ref 5–40)

## 2021-08-17 LAB — CBC WITH DIFFERENTIAL/PLATELET
Basophils Absolute: 0.1 10*3/uL (ref 0.0–0.2)
Basos: 1 %
EOS (ABSOLUTE): 0.2 10*3/uL (ref 0.0–0.4)
Eos: 2 %
Hematocrit: 45.6 % (ref 37.5–51.0)
Hemoglobin: 16.1 g/dL (ref 13.0–17.7)
Immature Grans (Abs): 0 10*3/uL (ref 0.0–0.1)
Immature Granulocytes: 1 %
Lymphocytes Absolute: 2.3 10*3/uL (ref 0.7–3.1)
Lymphs: 36 %
MCH: 33.1 pg — ABNORMAL HIGH (ref 26.6–33.0)
MCHC: 35.3 g/dL (ref 31.5–35.7)
MCV: 94 fL (ref 79–97)
Monocytes Absolute: 0.5 10*3/uL (ref 0.1–0.9)
Monocytes: 8 %
Neutrophils Absolute: 3.4 10*3/uL (ref 1.4–7.0)
Neutrophils: 52 %
Platelets: 272 10*3/uL (ref 150–450)
RBC: 4.86 x10E6/uL (ref 4.14–5.80)
RDW: 12.2 % (ref 11.6–15.4)
WBC: 6.4 10*3/uL (ref 3.4–10.8)

## 2021-08-17 LAB — CMP14+EGFR
ALT: 20 IU/L (ref 0–44)
AST: 23 IU/L (ref 0–40)
Albumin/Globulin Ratio: 1.6 (ref 1.2–2.2)
Albumin: 4.7 g/dL (ref 3.8–4.8)
Alkaline Phosphatase: 99 IU/L (ref 44–121)
BUN/Creatinine Ratio: 8 — ABNORMAL LOW (ref 10–24)
BUN: 8 mg/dL (ref 8–27)
Bilirubin Total: 0.6 mg/dL (ref 0.0–1.2)
CO2: 23 mmol/L (ref 20–29)
Calcium: 9.7 mg/dL (ref 8.6–10.2)
Chloride: 94 mmol/L — ABNORMAL LOW (ref 96–106)
Creatinine, Ser: 1.06 mg/dL (ref 0.76–1.27)
Globulin, Total: 2.9 g/dL (ref 1.5–4.5)
Glucose: 94 mg/dL (ref 70–99)
Potassium: 5.4 mmol/L — ABNORMAL HIGH (ref 3.5–5.2)
Sodium: 131 mmol/L — ABNORMAL LOW (ref 134–144)
Total Protein: 7.6 g/dL (ref 6.0–8.5)
eGFR: 77 mL/min/{1.73_m2} (ref >59)

## 2021-10-07 ENCOUNTER — Encounter: Payer: Self-pay | Admitting: Family Medicine

## 2021-10-07 ENCOUNTER — Ambulatory Visit (INDEPENDENT_AMBULATORY_CARE_PROVIDER_SITE_OTHER): Payer: 59

## 2021-10-07 ENCOUNTER — Ambulatory Visit (INDEPENDENT_AMBULATORY_CARE_PROVIDER_SITE_OTHER): Payer: 59 | Admitting: Family Medicine

## 2021-10-07 VITALS — BP 142/90 | HR 91 | Temp 98.2°F | Ht 68.0 in | Wt 196.0 lb

## 2021-10-07 DIAGNOSIS — W19XXXA Unspecified fall, initial encounter: Secondary | ICD-10-CM | POA: Diagnosis not present

## 2021-10-07 DIAGNOSIS — S63502A Unspecified sprain of left wrist, initial encounter: Secondary | ICD-10-CM

## 2021-10-07 DIAGNOSIS — M25532 Pain in left wrist: Secondary | ICD-10-CM

## 2021-10-07 NOTE — Progress Notes (Signed)
BP (!) 142/90   Pulse 91   Temp 98.2 F (36.8 C)   Ht '5\' 8"'$  (1.727 m)   Wt 196 lb (88.9 kg)   SpO2 96%   BMI 29.80 kg/m    Subjective:   Patient ID: Carl Mccullough, male    DOB: 1955/11/29, 66 y.o.   MRN: [id number]  HPI: Carl Mccullough is a 66 y.o. male presenting on 10/07/2021 for Wrist Pain (Left, fell three weeks ago)   HPI Left wrist pain Patient is coming in today complaining of left wrist pain that is been bothering him over the past 3 weeks.  He says he fell while he was working on his lawn more on his deck onto the outstretched hand has been hurting on the back of the wrist and up towards his thumb on that hand for the past 3 weeks.  He has been taking Advil sometimes and ice and sometimes patient ASAP and this is happened 2 weeks.  He says this is not improving.   Relevant past medical, surgical, family and social history reviewed and updated as indicated. Interim medical history since our last visit reviewed. Allergies and medications reviewed and updated.  Review of Systems  Constitutional:  Negative for chills and fever.  Respiratory:  Negative for shortness of breath and wheezing.   Cardiovascular:  Negative for chest pain and leg swelling.  Musculoskeletal:  Positive for arthralgias and myalgias. Negative for back pain and gait problem.  Skin:  Negative for color change and rash.  All other systems reviewed and are negative.   Per HPI unless specifically indicated above   Allergies as of 10/07/2021   No Known Allergies      Medication List        Accurate as of October 07, 2021  4:03 PM. If you have any questions, ask your nurse or doctor.          cetirizine 10 MG tablet Commonly known as: ZYRTEC Take 1 tablet (10 mg total) by mouth daily.   fluticasone 50 MCG/ACT nasal spray Commonly known as: FLONASE Place 2 sprays into both nostrils daily.   lisinopril-hydrochlorothiazide 10-12.5 MG tablet Commonly known as: ZESTORETIC Take 1 tablet by  mouth daily.   meloxicam 15 MG tablet Commonly known as: MOBIC TAKE 1 TABLET (15 MG TOTAL) BY MOUTH DAILY.   tadalafil 20 MG tablet Commonly known as: CIALIS Take 0.5-1 tablets (10-20 mg total) by mouth every other day as needed for erectile dysfunction.   vitamin C 1000 MG tablet Take 1,000 mg by mouth daily.   Vitamin D (Ergocalciferol) 1.25 MG (50000 UNIT) Caps capsule Commonly known as: DRISDOL Take 1 capsule (50,000 Units total) by mouth every 7 (seven) days.         Objective:   BP (!) 142/90   Pulse 91   Temp 98.2 F (36.8 C)   Ht '5\' 8"'$  (1.727 m)   Wt 196 lb (88.9 kg)   SpO2 96%   BMI 29.80 kg/m   Wt Readings from Last 3 Encounters:  10/07/21 196 lb (88.9 kg)  08/16/21 195 lb (88.5 kg)  02/08/21 193 lb (87.5 kg)    Physical Exam Vitals and nursing note reviewed.  Constitutional:      Appearance: Normal appearance.  Musculoskeletal:     Left wrist: Tenderness (With range of motion on the posterior aspect of the wrist on both sides, worse with extension of the wrist, no decreased grip strength) present.  Neurological:  Mental Status: He is alert.     Left hand and wrist x-ray: no acute bony abnormality.  Assessment & Plan:   Problem List Items Addressed This Visit   None Visit Diagnoses     Left wrist pain    -  Primary   Relevant Orders   DG Hand Complete Left   DG Wrist Complete Left   Fall, initial encounter       Relevant Orders   DG Hand Complete Left   DG Wrist Complete Left   Sprain of left wrist, initial encounter           X-ray looks good, will give him 1 week off work and then 2 weeks of no lifting greater than 10 pounds and that should give it time to heal, continue with the ice and Advil and do gentle range of motion exercises throughout the day. Follow up plan: Return if symptoms worsen or fail to improve.  Counseling provided for all of the vaccine components Orders Placed This Encounter  Procedures   DG Hand Complete  Left   DG Wrist Complete Left    Caryl Pina, MD [facility] 10/07/2021, 4:03 PM

## 2021-11-27 ENCOUNTER — Ambulatory Visit (INDEPENDENT_AMBULATORY_CARE_PROVIDER_SITE_OTHER): Payer: 59 | Admitting: Nurse Practitioner

## 2021-11-27 ENCOUNTER — Telehealth: Payer: Self-pay | Admitting: Nurse Practitioner

## 2021-11-27 ENCOUNTER — Encounter: Payer: Self-pay | Admitting: Nurse Practitioner

## 2021-11-27 VITALS — BP 146/96 | HR 85 | Temp 98.7°F | Ht 68.0 in | Wt 189.4 lb

## 2021-11-27 DIAGNOSIS — J069 Acute upper respiratory infection, unspecified: Secondary | ICD-10-CM

## 2021-11-27 MED ORDER — BENZONATATE 100 MG PO CAPS: 100.0000 mg | ORAL_CAPSULE | Freq: Three times a day (TID) | ORAL | 0 refills | Status: DC | PRN

## 2021-11-27 MED ORDER — GUAIFENESIN ER 600 MG PO TB12: 600.0000 mg | ORAL_TABLET | Freq: Two times a day (BID) | ORAL | 0 refills | Status: DC

## 2021-11-27 NOTE — Progress Notes (Signed)
Acute Office Visit  Subjective:     Patient ID: Carl Mccullough, male    DOB: 11-Jan-1956, 66 y.o.   MRN: [id number]  Chief Complaint  Patient presents with   Nasal Congestion    Getting worse - been going on for a month    URI  This is a new problem. The current episode started in the past 7 days. The problem has been unchanged. There has been no fever. Associated symptoms include congestion, coughing and rhinorrhea. Pertinent negatives include no ear pain, headaches, rash, sneezing, sore throat, swollen glands or wheezing. He has tried decongestant (claritin) for the symptoms. The treatment provided mild relief.    Review of Systems  Constitutional: Negative.  Negative for chills and fever.  HENT:  Positive for congestion and rhinorrhea. Negative for ear pain, sneezing and sore throat.   Respiratory:  Positive for cough. Negative for wheezing.   Cardiovascular: Negative.   Skin:  Negative for rash.  Neurological:  Negative for headaches.  All other systems reviewed and are negative.       Objective:    BP (!) 146/96   Pulse 85   Temp 98.7 F (37.1 C)   Ht '5\' 8"'$  (1.727 m)   Wt 189 lb 6.4 oz (85.9 kg)   SpO2 96%   BMI 28.80 kg/m  BP Readings from Last 3 Encounters:  11/27/21 (!) 146/96  10/07/21 (!) 142/90  08/16/21 118/73      Physical Exam Vitals and nursing note reviewed.  Constitutional:      Appearance: Normal appearance.  HENT:     Head: Normocephalic.     Right Ear: External ear normal.     Left Ear: External ear normal.     Nose: Congestion present.     Mouth/Throat:     Mouth: Mucous membranes are moist.     Pharynx: Oropharynx is clear.  Eyes:     Conjunctiva/sclera: Conjunctivae normal.  Cardiovascular:     Rate and Rhythm: Normal rate and regular rhythm.     Pulses: Normal pulses.     Heart sounds: Normal heart sounds.  Pulmonary:     Effort: Pulmonary effort is normal.     Breath sounds: Normal breath sounds.  Abdominal:     General:  Bowel sounds are normal.  Skin:    General: Skin is warm.     Findings: No erythema or rash.  Neurological:     Mental Status: He is alert and oriented to person, place, and time.     No results found for any visits on 11/27/21.      Assessment & Plan:  Patient presents with viral upper respiratory infection with no fever, body aches or COVID-19 symptoms. I educated patient to  Take meds as prescribed as his symptoms presents as allergic rhinitis. - Use a cool mist humidifier  -Use saline nose sprays frequently -Force fluids -For fever or aches or pains- take Tylenol or ibuprofen. -If symptoms do not improve, she may need to be COVID tested to rule this out Follow up with worsening unresolved symptoms   Problem List Items Addressed This Visit   None Visit Diagnoses     Viral upper respiratory tract infection    -  Primary   Relevant Medications   benzonatate (TESSALON PERLES) 100 MG capsule   guaiFENesin (MUCINEX) 600 MG 12 hr tablet       Meds ordered this encounter  Medications   benzonatate (TESSALON PERLES) 100 MG capsule  Sig: Take 1 capsule (100 mg total) by mouth 3 (three) times daily as needed.    Dispense:  20 capsule    Refill:  0    Order Specific Question:   Supervising Provider    Answer:   Claretta Fraise [[id number]]   guaiFENesin (MUCINEX) 600 MG 12 hr tablet    Sig: Take 1 tablet (600 mg total) by mouth 2 (two) times daily.    Dispense:  30 tablet    Refill:  0    Order Specific Question:   Supervising Provider    Answer:   Claretta Fraise [phone]    Return if symptoms worsen or fail to improve.  Ivy Lynn, NP

## 2021-11-27 NOTE — Patient Instructions (Signed)
Viral Respiratory Infection A respiratory infection is an illness that affects part of the respiratory system, such as the lungs, nose, or throat. A respiratory infection that is caused by a virus is called a viral respiratory infection. Common types of viral respiratory infections include: A cold. The flu (influenza). A respiratory syncytial virus (RSV) infection. What are the causes? This condition is caused by a virus. The virus may spread through contact with droplets or direct contact with infected people or their mucus or secretions. The virus may spread from person to person (is contagious). What are the signs or symptoms? Symptoms of this condition include: A stuffy or runny nose. A sore throat or cough. Shortness of breath or difficulty breathing. Yellow or green mucus (sputum). Other symptoms may include: A fever. Sweating or chills. Fatigue. Achy muscles. A headache. How is this diagnosed? This condition may be diagnosed based on: Your symptoms. A physical exam. Testing of secretions from the nose or throat. Chest X-ray. How is this treated? This condition may be treated with medicines, such as: Antiviral medicine. This may shorten the length of time a person has symptoms. Expectorants. These make it easier to cough up mucus. Decongestant nasal sprays. Acetaminophen or NSAIDs, such as ibuprofen, to relieve fever and pain. Antibiotic medicines are not prescribed for viral infections.This is because antibiotics are designed to kill bacteria. They do not kill viruses. Follow these instructions at home: Managing pain and congestion Take over-the-counter and prescription medicines only as told by your health care provider. If you have a sore throat, gargle with a mixture of salt and water 3-4 times a day or as needed. To make salt water, completely dissolve -1 tsp (3-6 g) of salt in 1 cup (237 mL) of warm water. Use nose drops made from salt water to ease congestion and  soften raw skin around your nose. Take 2 tsp (10 mL) of honey at bedtime to lessen coughing at night. Do not give honey to children who are younger than 1 year. Drink enough fluid to keep your urine pale yellow. This helps prevent dehydration and helps loosen up mucus. General instructions  Rest as much as possible. Do not drink alcohol. Do not use any products that contain nicotine or tobacco. These products include cigarettes, chewing tobacco, and vaping devices, such as e-cigarettes. If you need help quitting, ask your health care provider. Keep all follow-up visits. This is important. How is this prevented?     Get an annual flu shot. You may get the flu shot in late summer, fall, or winter. Ask your health care provider when you should get your flu shot. Avoid spreading your infection to other people. If you are sick: Wash your hands with soap and water often, especially after you cough or sneeze. Wash for at least 20 seconds. If soap and water are not available, use alcohol-based hand sanitizer. Cover your mouth when you cough. Cover your nose and mouth when you sneeze. Do not share cups or eating utensils. Clean commonly used objects often. Clean commonly touched surfaces. Stay home from work or school as told by your health care provider. Avoid contact with people who are sick during cold and flu season. This is generally fall and winter. Contact a health care provider if: Your symptoms last for 10 days or longer. Your symptoms get worse over time. You have severe sinus pain in your face or forehead. The glands in your jaw or neck become very swollen. You have shortness of breath. Get   help right away if you: Feel pain or pressure in your chest. Have trouble breathing. Faint or feel like you will faint. Have severe and persistent vomiting. Feel confused or disoriented. These symptoms may represent a serious problem that is an emergency. Do not wait to see if the symptoms will  go away. Get medical help right away. Call your local emergency services (911 in the [place] Do not drive yourself to the hospital. Summary A respiratory infection is an illness that affects part of the respiratory system, such as the lungs, nose, or throat. A respiratory infection that is caused by a virus is called a viral respiratory infection. Common types of viral respiratory infections include a cold, influenza, and respiratory syncytial virus (RSV) infection. Symptoms of this condition include a stuffy or runny nose, cough, fatigue, achy muscles, sore throat, and fevers or chills. Antibiotic medicines are not prescribed for viral infections. This is because antibiotics are designed to kill bacteria. They are not effective against viruses. This information is not intended to replace advice given to you by your health care provider. Make sure you discuss any questions you have with your health care provider. Document Revised: 05/10/2020 Document Reviewed: 05/10/2020 Elsevier Patient Education  2023 Elsevier Inc.  

## 2021-11-28 NOTE — Telephone Encounter (Signed)
Name from pharmacy: GUAIFENESIN ER 600 MG TABLET        Will file in chart as: guaiFENesin (MUCINEX) 600 MG 12 hr tablet   Pharmacy comment: Alternative Requested:PRIOR AUTH REQUIRED.

## 2021-11-29 NOTE — Telephone Encounter (Signed)
Continue tessalon for cough and for congestion, Dylsum OTC will be just fine. Than you. F/U with unresolved symptoms

## 2021-12-05 ENCOUNTER — Ambulatory Visit (INDEPENDENT_AMBULATORY_CARE_PROVIDER_SITE_OTHER): Payer: 59

## 2021-12-05 ENCOUNTER — Encounter: Payer: Self-pay | Admitting: Nurse Practitioner

## 2021-12-05 ENCOUNTER — Ambulatory Visit (INDEPENDENT_AMBULATORY_CARE_PROVIDER_SITE_OTHER): Payer: 59 | Admitting: Nurse Practitioner

## 2021-12-05 VITALS — BP 123/81 | HR 85 | Temp 97.7°F | Resp 20 | Ht 68.0 in | Wt 199.0 lb

## 2021-12-05 DIAGNOSIS — R051 Acute cough: Secondary | ICD-10-CM

## 2021-12-05 MED ORDER — METHYLPREDNISOLONE ACETATE 80 MG/ML IJ SUSP: 80.0000 mg | Freq: Once | INTRAMUSCULAR | Status: AC

## 2021-12-05 MED ORDER — PREDNISONE 20 MG PO TABS: 40.0000 mg | ORAL_TABLET | Freq: Every day | ORAL | 0 refills | Status: AC

## 2021-12-05 MED ORDER — PREDNISONE 20 MG PO TABS: ORAL_TABLET | ORAL | 0 refills | Status: DC

## 2021-12-05 MED ORDER — HYDROCODONE BIT-HOMATROP MBR 5-1.5 MG/5ML PO SOLN: 5.0000 mL | Freq: Three times a day (TID) | ORAL | 0 refills | Status: DC | PRN

## 2021-12-05 NOTE — Patient Instructions (Signed)

## 2021-12-05 NOTE — Progress Notes (Signed)
Subjective:    Patient ID: Carl Mccullough, male    DOB: 1955-08-02, 66 y.o.   MRN: [id number]   Chief Complaint: Cough (Seen last week and cough has continued)   Pt seen today for cough x 1 month; seen last week in this office for same; has been taking tessalon perles and mucinex with no relief.   Cough This is a new problem. The current episode started more than 1 month ago. The problem has been gradually worsening. The problem occurs hourly. The cough is Productive of sputum (thick, clear sputum). Associated symptoms include nasal congestion, postnasal drip, rhinorrhea, a sore throat and shortness of breath. Pertinent negatives include no chest pain, chills, ear congestion, ear pain, fever, headaches or myalgias. Nothing aggravates the symptoms. He has tried prescription cough suppressant and cool air for the symptoms. The treatment provided no relief. His past medical history is significant for environmental allergies.       Review of Systems  Constitutional:  Negative for chills, fatigue and fever.  HENT:  Positive for congestion, postnasal drip, rhinorrhea and sore throat. Negative for ear pain, sinus pressure, sinus pain and sneezing.   Respiratory:  Positive for cough and shortness of breath. Negative for chest tightness.   Cardiovascular:  Negative for chest pain.  Gastrointestinal:  Negative for abdominal pain, diarrhea, nausea and vomiting.  Musculoskeletal:  Negative for myalgias.  Allergic/Immunologic: Positive for environmental allergies.  Neurological:  Negative for headaches.  Hematological:  Negative for adenopathy.       Objective:   Physical Exam Vitals and nursing note reviewed.  Constitutional:      General: He is not in acute distress.    Appearance: Normal appearance.  HENT:     Head: Normocephalic.     Right Ear: Tympanic membrane normal.     Left Ear: Tympanic membrane normal.     Nose: Nose normal.     Mouth/Throat:     Mouth: Mucous membranes are  moist.     Pharynx: Posterior oropharyngeal erythema present. No oropharyngeal exudate or uvula swelling.     Tonsils: No tonsillar exudate.  Eyes:     Pupils: Pupils are equal, round, and reactive to light.  Cardiovascular:     Rate and Rhythm: Normal rate and regular rhythm.     Pulses: Normal pulses.     Heart sounds: Normal heart sounds.  Pulmonary:     Effort: Pulmonary effort is normal. No respiratory distress.     Breath sounds: Normal breath sounds. No wheezing, rhonchi or rales.  Abdominal:     General: Bowel sounds are normal.  Lymphadenopathy:     Cervical: No cervical adenopathy.  Skin:    General: Skin is warm and dry.     Capillary Refill: Capillary refill takes less than 2 seconds.  Neurological:     Mental Status: He is alert.  Psychiatric:        Mood and Affect: Mood normal.        Behavior: Behavior normal.   BP 123/81   Pulse 85   Temp 97.7 F (36.5 C) (Temporal)   Resp 20   Ht '5\' 8"'$  (1.727 m)   Wt 199 lb (90.3 kg)   SpO2 97%   BMI 30.26 kg/m          Assessment & Plan:   [name] in today with chief complaint of Cough (Seen last week and cough has continued)   1. Acute cough Flonase every  morning Claritin  at night - DG Chest 2 View - methylPREDNISolone acetate (DEPO-MEDROL) injection 80 mg  Meds ordered this encounter  Medications   HYDROcodone bit-homatropine (HYCODAN) 5-1.5 MG/5ML syrup    Sig: Take 5 mLs by mouth every 8 (eight) hours as needed for cough.    Dispense:  120 mL    Refill:  0    Order Specific Question:   Supervising Provider    Answer:   Caryl Pina A [[id number]]   predniSONE (DELTASONE) 20 MG tablet    Sig: Take 2 tablets (40 mg total) by mouth daily with breakfast for 5 days. 2 po daily for 5 days    Dispense:  10 tablet    Refill:  0    Order Specific Question:   Supervising Provider    Answer:   Caryl Pina A [[id number]]   predniSONE (DELTASONE) 20 MG tablet    Sig: 2 po at sametime daily for  5 days-    Dispense:  10 tablet    Refill:  0    Order Specific Question:   Supervising Provider    Answer:   Caryl Pina A [[id number]]   methylPREDNISolone acetate (DEPO-MEDROL) injection 80 mg     The above assessment and management plan was discussed with the patient. The patient verbalized understanding of and has agreed to the management plan. Patient is aware to call the clinic if symptoms persist or worsen. Patient is aware when to return to the clinic for a follow-up visit. Patient educated on when it is appropriate to go to the emergency department.   Mary-Margaret Hassell Done, FNP

## 2021-12-13 ENCOUNTER — Telehealth: Payer: Self-pay

## 2021-12-13 ENCOUNTER — Telehealth: Payer: Self-pay | Admitting: Nurse Practitioner

## 2021-12-13 DIAGNOSIS — R051 Acute cough: Secondary | ICD-10-CM

## 2021-12-13 MED ORDER — PANTOPRAZOLE SODIUM 40 MG PO TBEC: 40.0000 mg | DELAYED_RELEASE_TABLET | Freq: Every day | ORAL | 3 refills | Status: DC

## 2021-12-13 NOTE — Telephone Encounter (Signed)
Lets add some protonix and see if reflux is aggravating  cough. If no better by Monday let me know.  Mary-Margaret Hassell Done, FNP

## 2021-12-13 NOTE — Telephone Encounter (Signed)
Left message informing pt that MMM had reviewed his message and has sent in a new med for him and to call back if better. Could not leave details

## 2021-12-13 NOTE — Telephone Encounter (Signed)
  Incoming Patient Call  12/13/2021  What symptoms do you have? Still having a cough.  How long have you been sick? A month or more  Have you been seen for this problem? YES 12-05-21 last time  If your provider decides to give you a prescription, which pharmacy would you like for it to be sent to? [facility] in [place]   Patient informed that this information will be sent to the clinical staff for review and that they should receive a follow up call.

## 2021-12-13 NOTE — Telephone Encounter (Signed)
Pt returned call. He is aware to start Protonix. He wanted to make Shelah Lewandowsky aware that the cough med she sent in does not help and that it is not the same as he usually takes which does help. Please call in [facility]codone chlorphen [facility] to [facility] [place].  Informed pt that Shelah Lewandowsky may have already left for the day but that I would send message to her.

## 2021-12-16 MED ORDER — HYDROCOD POLI-CHLORPHE POLI ER 10-8 MG/5ML PO SUER: 5.0000 mL | Freq: Two times a day (BID) | ORAL | 0 refills | Status: DC | PRN

## 2021-12-16 NOTE — Telephone Encounter (Signed)
Tussionex sent to pharmacy

## 2021-12-16 NOTE — Telephone Encounter (Signed)
Patient aware and verbalized understanding. °

## 2021-12-20 DIAGNOSIS — M5432 Sciatica, left side: Secondary | ICD-10-CM | POA: Diagnosis not present

## 2022-01-13 ENCOUNTER — Telehealth: Payer: Self-pay | Admitting: Internal Medicine

## 2022-01-13 NOTE — Telephone Encounter (Signed)
Inbound call from patient stating that he would like a refill for Ciprofloxacin and Metonidazole. Patient stated that he had a flare up over the weekend and only has enough medication to get him to tomorrow. Please advise.

## 2022-01-14 NOTE — Telephone Encounter (Signed)
Diverticulitis history with lower right side abd pain. He also ran a fever of 100.7 F. Same pain/symptoms he has experienced in the past with flares.  He had 3 days of cipro and flagyl on hand and has started that and has begun to feel better.   He would like to have a refill so that he can finish a full course.  Ok to fill?

## 2022-01-15 ENCOUNTER — Other Ambulatory Visit: Payer: Self-pay

## 2022-01-15 MED ORDER — CIPROFLOXACIN HCL 500 MG PO TABS: 500.0000 mg | ORAL_TABLET | Freq: Two times a day (BID) | ORAL | 0 refills | Status: DC

## 2022-01-15 MED ORDER — METRONIDAZOLE 250 MG PO TABS: 250.0000 mg | ORAL_TABLET | Freq: Three times a day (TID) | ORAL | 0 refills | Status: DC

## 2022-01-15 NOTE — Telephone Encounter (Signed)
Prescriptions sent to pharmacy and pt aware. 

## 2022-01-15 NOTE — Telephone Encounter (Signed)
Yes x 7 days Cipro 500 mg bid and metronidazole 250 mg tid

## 2022-01-17 ENCOUNTER — Encounter: Payer: Self-pay | Admitting: Nurse Practitioner

## 2022-01-17 ENCOUNTER — Ambulatory Visit (INDEPENDENT_AMBULATORY_CARE_PROVIDER_SITE_OTHER): Payer: Medicare Other | Admitting: Nurse Practitioner

## 2022-01-17 DIAGNOSIS — R300 Dysuria: Secondary | ICD-10-CM | POA: Diagnosis not present

## 2022-01-17 LAB — URINALYSIS
Bilirubin, UA: NEGATIVE
Glucose, UA: NEGATIVE
Ketones, UA: NEGATIVE
Leukocytes,UA: NEGATIVE
Nitrite, UA: NEGATIVE
Protein,UA: NEGATIVE
RBC, UA: NEGATIVE
Specific Gravity, UA: 1.02 (ref 1.005–1.030)
Urobilinogen, Ur: 0.2 mg/dL (ref 0.2–1.0)
pH, UA: 6.5 (ref 5.0–7.5)

## 2022-01-17 NOTE — Progress Notes (Signed)
   Virtual Visit  Note Due to COVID-19 pandemic this visit was conducted virtually. This visit type was conducted due to national recommendations for restrictions regarding the COVID-19 Pandemic (e.g. social distancing, sheltering in place) in an effort to limit this patient's exposure and mitigate transmission in our community. All issues noted in this document were discussed and addressed.  A physical exam was not performed with this format.  I connected with Carl Mccullough on 01/17/22 at 11:00 am  by telephone and verified that I am speaking with the correct person using two identifiers. Carl Mccullough is currently located at home during visit. The provider, Ivy Lynn, NP is located in their office at time of visit.  I discussed the limitations, risks, security and privacy concerns of performing an evaluation and management service by telephone and the availability of in person appointments. I also discussed with the patient that there may be a patient responsible charge related to this service. The patient expressed understanding and agreed to proceed.   History and Present Illness:  HPI    Review of Systems  Constitutional: Negative.  Negative for chills and fever.  HENT: Negative.    Respiratory:  Positive for cough.   Gastrointestinal: Negative.  Negative for nausea and vomiting.  Genitourinary:  Positive for dysuria. Negative for flank pain, frequency, hematuria and urgency.  Musculoskeletal: Negative.   Skin: Negative.   All other systems reviewed and are negative.    Observations/Objective: Tele-visit patient is not in distress  Assessment and Plan:  UTI  vs  interstitial cystitis -urinalysis completed results pending -pyridium for pain Precaution and education provided -All questions answered -follow up with unresolved symptoms   Follow Up Instructions: Follow with worsening unresolved symptoms    I discussed the assessment and treatment plan with the  patient. The patient was provided an opportunity to ask questions and all were answered. The patient agreed with the plan and demonstrated an understanding of the instructions.   The patient was advised to call back or seek an in-person evaluation if the symptoms worsen or if the condition fails to improve as anticipated.  The above assessment and management plan was discussed with the patient. The patient verbalized understanding of and has agreed to the management plan. Patient is aware to call the clinic if symptoms persist or worsen. Patient is aware when to return to the clinic for a follow-up visit. Patient educated on when it is appropriate to go to the emergency department.   Time call ended:  11:11 am   I provided 11 minutes of  non face-to-face time during this encounter.    Ivy Lynn, NP

## 2022-01-19 LAB — URINE CULTURE: Organism ID, Bacteria: NO GROWTH

## 2022-01-22 ENCOUNTER — Ambulatory Visit (INDEPENDENT_AMBULATORY_CARE_PROVIDER_SITE_OTHER): Payer: Medicare Other | Admitting: Nurse Practitioner

## 2022-01-22 ENCOUNTER — Ambulatory Visit (INDEPENDENT_AMBULATORY_CARE_PROVIDER_SITE_OTHER): Payer: Medicare Other

## 2022-01-22 ENCOUNTER — Encounter: Payer: Self-pay | Admitting: Nurse Practitioner

## 2022-01-22 VITALS — BP 129/80 | HR 79 | Temp 97.7°F | Resp 20 | Ht 68.0 in | Wt 193.0 lb

## 2022-01-22 DIAGNOSIS — R051 Acute cough: Secondary | ICD-10-CM

## 2022-01-22 DIAGNOSIS — R059 Cough, unspecified: Secondary | ICD-10-CM | POA: Diagnosis not present

## 2022-01-22 LAB — CBC WITH DIFFERENTIAL/PLATELET
Basophils Absolute: 0.1 10*3/uL (ref 0.0–0.2)
Basos: 1 %
EOS (ABSOLUTE): 0.1 10*3/uL (ref 0.0–0.4)
Eos: 2 %
Hematocrit: 43.3 % (ref 37.5–51.0)
Hemoglobin: 15.3 g/dL (ref 13.0–17.7)
Immature Grans (Abs): 0 10*3/uL (ref 0.0–0.1)
Immature Granulocytes: 0 %
Lymphocytes Absolute: 2 10*3/uL (ref 0.7–3.1)
Lymphs: 35 %
MCH: 32.9 pg (ref 26.6–33.0)
MCHC: 35.3 g/dL (ref 31.5–35.7)
MCV: 93 fL (ref 79–97)
Monocytes Absolute: 0.7 10*3/uL (ref 0.1–0.9)
Monocytes: 12 %
Neutrophils Absolute: 2.9 10*3/uL (ref 1.4–7.0)
Neutrophils: 50 %
Platelets: 307 10*3/uL (ref 150–450)
RBC: 4.65 x10E6/uL (ref 4.14–5.80)
RDW: 11.9 % (ref 11.6–15.4)
WBC: 5.7 10*3/uL (ref 3.4–10.8)

## 2022-01-22 MED ORDER — PREDNISONE 10 MG (21) PO TBPK: ORAL_TABLET | ORAL | 0 refills | Status: DC

## 2022-01-22 MED ORDER — BUDESONIDE-FORMOTEROL FUMARATE 80-4.5 MCG/ACT IN AERO: 2.0000 | INHALATION_SPRAY | Freq: Two times a day (BID) | RESPIRATORY_TRACT | 3 refills | Status: DC

## 2022-01-22 NOTE — Progress Notes (Signed)
   Subjective:    Patient ID: Carl Mccullough, male    DOB: 1955/10/19, 66 y.o.   MRN: [id number]  Chief Complaint: cough  HPI  Patient had a iral upper respiratory infection that started on 11/27/21.  Was originally treated with tessalon perles and mucinex. The cough continued  to worsen. He came back in on 12/05/21. AT that time was treated with prednisone injection as well as oral steroids and hycodan. Today he says cough is no better. Cold air increases cough. He actually was coughing for 2 months prior to first visit. Cough gets so bad sometimes that he almost looses his breath. Mucinex al in one capsule has helped. He doe snot think that the prednisone has helped at all.  Review of Systems  Constitutional:  Positive for fatigue. Negative for chills and fever.  HENT:  Positive for congestion and rhinorrhea. Negative for sore throat.   Respiratory:  Positive for cough and shortness of breath (only when having coughing fit).        Objective:   Physical Exam Constitutional:      Appearance: Normal appearance.  Cardiovascular:     Rate and Rhythm: Normal rate and regular rhythm.     Heart sounds: Normal heart sounds.  Pulmonary:     Effort: Pulmonary effort is normal. No respiratory distress.     Breath sounds: Normal breath sounds. No stridor. No wheezing or rales.  Skin:    General: Skin is warm.  Neurological:     General: No focal deficit present.     Mental Status: He is alert and oriented to person, place, and time.  Psychiatric:        Mood and Affect: Mood normal.        Behavior: Behavior normal.     Chest xray clear   BP 129/80   Pulse 79   Temp 97.7 F (36.5 C) (Temporal)   Resp 20   Ht '5\' 8"'$  (1.727 m)   Wt 193 lb (87.5 kg)   SpO2 97%   BMI 29.35 kg/m      Assessment & Plan:   Carl Mccullough in today with chief complaint of Cough and Nasal Congestion   1. Acute cough Force fluids Run humidifier Follow up by phone in 1-2 days - DG Chest 2  View - budesonide-formoterol (SYMBICORT) 80-4.5 MCG/ACT inhaler; Inhale 2 puffs into the lungs 2 (two) times daily.  Dispense: 1 each; Refill: 3 - predniSONE (STERAPRED UNI-PAK 21 TAB) 10 MG (21) TBPK tablet; As directed x 6 days  Dispense: 21 tablet; Refill: 0    The above assessment and management plan was discussed with the patient. The patient verbalized understanding of and has agreed to the management plan. Patient is aware to call the clinic if symptoms persist or worsen. Patient is aware when to return to the clinic for a follow-up visit. Patient educated on when it is appropriate to go to the emergency department.   Mary-Margaret Hassell Done, FNP

## 2022-01-22 NOTE — Addendum Note (Signed)
Addended by: Chevis Pretty on: 01/22/2022 11:08 AM   Modules accepted: Orders

## 2022-01-22 NOTE — Patient Instructions (Signed)

## 2022-01-23 ENCOUNTER — Telehealth: Payer: Self-pay | Admitting: Nurse Practitioner

## 2022-01-23 NOTE — Telephone Encounter (Signed)
Pts wife called to let MMM know that the Pulmicort Inhaler is going to cost pt $100 after insurance. Wants to know if MMM can send in anything cheaper. If not, or if pt really needs the pulmicort then they will pay it but just wanted to check with MMM first.

## 2022-02-06 ENCOUNTER — Ambulatory Visit: Payer: Medicare Other

## 2022-02-18 ENCOUNTER — Encounter: Payer: Self-pay | Admitting: Nurse Practitioner

## 2022-02-18 ENCOUNTER — Ambulatory Visit (INDEPENDENT_AMBULATORY_CARE_PROVIDER_SITE_OTHER): Payer: Medicare Other | Admitting: Nurse Practitioner

## 2022-02-18 VITALS — BP 120/82 | HR 72 | Temp 97.8°F | Resp 20 | Ht 68.0 in | Wt 194.0 lb

## 2022-02-18 DIAGNOSIS — E782 Mixed hyperlipidemia: Secondary | ICD-10-CM | POA: Diagnosis not present

## 2022-02-18 DIAGNOSIS — E559 Vitamin D deficiency, unspecified: Secondary | ICD-10-CM

## 2022-02-18 DIAGNOSIS — L989 Disorder of the skin and subcutaneous tissue, unspecified: Secondary | ICD-10-CM

## 2022-02-18 DIAGNOSIS — E349 Endocrine disorder, unspecified: Secondary | ICD-10-CM

## 2022-02-18 DIAGNOSIS — I10 Essential (primary) hypertension: Secondary | ICD-10-CM | POA: Diagnosis not present

## 2022-02-18 DIAGNOSIS — Z23 Encounter for immunization: Secondary | ICD-10-CM | POA: Diagnosis not present

## 2022-02-18 DIAGNOSIS — N400 Enlarged prostate without lower urinary tract symptoms: Secondary | ICD-10-CM

## 2022-02-18 DIAGNOSIS — K5730 Diverticulosis of large intestine without perforation or abscess without bleeding: Secondary | ICD-10-CM | POA: Diagnosis not present

## 2022-02-18 MED ORDER — VITAMIN D (ERGOCALCIFEROL) 1.25 MG (50000 UNIT) PO CAPS: 50000.0000 [IU] | ORAL_CAPSULE | ORAL | 3 refills | Status: DC

## 2022-02-18 MED ORDER — LISINOPRIL-HYDROCHLOROTHIAZIDE 10-12.5 MG PO TABS: 1.0000 | ORAL_TABLET | Freq: Every day | ORAL | 1 refills | Status: DC

## 2022-02-18 NOTE — Progress Notes (Addendum)
Subjective:    Patient ID: Carl Mccullough, male    DOB: [birth year], 67 y.o.   MRN: [id number]   Chief Complaint: medical management of chronic issues     HPI:  Carl Mccullough is a 67 y.o. who identifies as a male who was assigned male at birth.   Social history: Lives with: wife Work history: retired at the end of october   Comes in today for follow up of the following chronic medical issues:  1. Primary hypertension No c/o chest pain, osb or headache. Does not check blood pressure at home. BP Readings from Last 3 Encounters:  01/22/22 129/80  12/05/21 123/81  11/27/21 (!) 146/96     2. Mixed hyperlipidemia Does try to watch diet. Does no dedicated exercise. Lab Results  Component Value Date   CHOL 167 08/16/2021   HDL 65 08/16/2021   LDLCALC 87 08/16/2021   TRIG 81 08/16/2021   CHOLHDL 2.6 08/16/2021     3. Benign prostatic hyperplasia without lower urinary tract symptoms Denies any voiding issues Lab Results  Component Value Date   PSA1 1.0 02/08/2021   PSA1 1.2 02/07/2020   PSA1 0.8 12/22/2017   PSA 0.9 09/29/2013      4. Diverticulosis of colon without hemorrhage No recent flare ups  5. Testosterone deficiency Is not on any testosterone replacement Lab Results  Component Value Date   TESTOSTERONE 403 02/08/2021     6. Vitamin D deficiency Is on weekly vitamin d supplement Last vitamin D Lab Results  Component Value Date   VD25OH 39.4 08/03/2018      New complaints: He has had a  bad cough for several weeks. Has ben getting some better the last week or so.  No Known Allergies Outpatient Encounter Medications as of 02/18/2022  Medication Sig   Ascorbic Acid (VITAMIN C) 1000 MG tablet Take 1,000 mg by mouth daily.   budesonide-formoterol (SYMBICORT) 80-4.5 MCG/ACT inhaler Inhale 2 puffs into the lungs 2 (two) times daily.   cetirizine (ZYRTEC) 10 MG tablet Take 1 tablet (10 mg total) by mouth daily.   fluticasone (FLONASE) 50 MCG/ACT  nasal spray Place 2 sprays into both nostrils daily.   guaiFENesin (MUCINEX) 600 MG 12 hr tablet Take 1 tablet (600 mg total) by mouth 2 (two) times daily.   HYDROcodone bit-homatropine (HYCODAN) 5-1.5 MG/5ML syrup Take 5 mLs by mouth every 8 (eight) hours as needed for cough.   lisinopril-hydrochlorothiazide (ZESTORETIC) 10-12.5 MG tablet Take 1 tablet by mouth daily.   meloxicam (MOBIC) 15 MG tablet TAKE 1 TABLET (15 MG TOTAL) BY MOUTH DAILY.   pantoprazole (PROTONIX) 40 MG tablet Take 1 tablet (40 mg total) by mouth daily.   predniSONE (STERAPRED UNI-PAK 21 TAB) 10 MG (21) TBPK tablet As directed x 6 days   tadalafil (CIALIS) 20 MG tablet Take 0.5-1 tablets (10-20 mg total) by mouth every other day as needed for erectile dysfunction.   Vitamin D, Ergocalciferol, (DRISDOL) 1.25 MG (50000 UNIT) CAPS capsule Take 1 capsule (50,000 Units total) by mouth every 7 (seven) days.   Facility-Administered Encounter Medications as of 02/18/2022  Medication   methylPREDNISolone acetate (DEPO-MEDROL) injection 80 mg    Past Surgical History:  Procedure Laterality Date   CYSTOSCOPY  [id number]   INGUINAL HERNIA REPAIR N/A 06/02/2019   Procedure: LAPAROSCOPIC BILATERAL INGUINAL HERNIA REPAIR, PRIMARY UMBILICAL HERNIA REPAIR;  Surgeon: Michael Boston, MD;  Location: [place] [facility];  Service: General;  Laterality: N/A;  GENERAL WITH ERAS  neck stitches  yrs ago   cut neck with chainsaw   TONSILLECTOMY      Family History  Problem Relation Age of Onset   Hypertension Mother    Alzheimer's disease Mother    Chronic Renal Failure Mother    Colon cancer Father    Bladder Cancer Father    Hypertension Brother    Rectal cancer Neg Hx    Stomach cancer Neg Hx    Esophageal cancer Neg Hx    Colon polyps Neg Hx       Controlled substance contract: n/a     Review of Systems  Constitutional:  Negative for diaphoresis.  Eyes:  Negative for pain.  Respiratory:  Negative for shortness of  breath.   Cardiovascular:  Negative for chest pain, palpitations and leg swelling.  Gastrointestinal:  Negative for abdominal pain.  Endocrine: Negative for polydipsia.  Skin:  Negative for rash.  Neurological:  Negative for dizziness, weakness and headaches.  Hematological:  Does not bruise/bleed easily.  All other systems reviewed and are negative.      Objective:   Physical Exam Vitals and nursing note reviewed.  Constitutional:      Appearance: Normal appearance. He is well-developed.  HENT:     Head: Normocephalic.     Nose: Nose normal.     Mouth/Throat:     Mouth: Mucous membranes are moist.     Pharynx: Oropharynx is clear.  Eyes:     Pupils: Pupils are equal, round, and reactive to light.  Neck:     Thyroid: No thyroid mass or thyromegaly.     Vascular: No carotid bruit or JVD.     Trachea: Phonation normal.  Cardiovascular:     Rate and Rhythm: Normal rate and regular rhythm.  Pulmonary:     Effort: Pulmonary effort is normal. No respiratory distress.     Breath sounds: Normal breath sounds.  Abdominal:     General: Bowel sounds are normal.     Palpations: Abdomen is soft.     Tenderness: There is no abdominal tenderness.  Musculoskeletal:        General: Normal range of motion.     Cervical back: Normal range of motion and neck supple.  Lymphadenopathy:     Cervical: No cervical adenopathy.  Skin:    General: Skin is warm and dry.     Comments: 3cm scaly raised scalp lesion  Neurological:     Mental Status: He is alert and oriented to person, place, and time.  Psychiatric:        Behavior: Behavior normal.        Thought Content: Thought content normal.        Judgment: Judgment normal.    BP 120/82   Pulse 72   Temp 97.8 F (36.6 C) (Temporal)   Resp 20   Ht _0  (1.727 m)   Wt 194 lb (88 kg)   SpO2 96%   BMI 29.50 kg/m   Cryotherapy- scalp lesion      Assessment & Plan:  Carl Mccullough comes in today with chief complaint of Medical  Management of Chronic Issues   Diagnosis and orders addressed:  1. Primary hypertension Low sodium diet - CBC with Differential/Platelet - CMP14+EGFR - lisinopril-hydrochlorothiazide (ZESTORETIC) 10-12.5 MG tablet; Take 1 tablet by mouth daily.  Dispense: 90 tablet; Refill: 1  2. Mixed hyperlipidemia Low fat diet - Lipid panel  3. Benign prostatic hyperplasia without lower urinary tract symptoms Report any voiding  issues - PSA, total and free  4. Diverticulosis of colon without hemorrhage Watch diet to prevent flare up  5. Testosterone deficiency Labs pending  6. Vitamin D deficiency Labs pending - Vitamin D, Ergocalciferol, (DRISDOL) 1.25 MG (50000 UNIT) CAPS capsule; Take 1 capsule (50,000 Units total) by mouth every 7 (seven) days.  Dispense: 12 capsule; Refill: 3  7. Scalp lesion Cryotherapy Do not pick or scratch at area.  Labs pending Health Maintenance reviewed Diet and exercise encouraged  Follow up plan: 6 months   Mary-Margaret Hassell Done, FNP

## 2022-02-18 NOTE — Addendum Note (Signed)
Addended by: Chevis Pretty on: 02/18/2022 08:55 AM   Modules accepted: Level of Service

## 2022-02-18 NOTE — Patient Instructions (Signed)

## 2022-02-22 ENCOUNTER — Other Ambulatory Visit: Payer: Self-pay | Admitting: Nurse Practitioner

## 2022-02-22 DIAGNOSIS — J3089 Other allergic rhinitis: Secondary | ICD-10-CM

## 2022-02-26 LAB — PSA, TOTAL AND FREE
PSA, Free Pct: 41.5 %
PSA, Free: 0.54 ng/mL
Prostate Specific Ag, Serum: 1.3 ng/mL (ref 0.0–4.0)

## 2022-02-26 LAB — CBC WITH DIFFERENTIAL/PLATELET
Basophils Absolute: 0.1 10*3/uL (ref 0.0–0.2)
Basos: 1 %
EOS (ABSOLUTE): 0.2 10*3/uL (ref 0.0–0.4)
Eos: 3 %
Hematocrit: 47 % (ref 37.5–51.0)
Hemoglobin: 16 g/dL (ref 13.0–17.7)
Immature Grans (Abs): 0 10*3/uL (ref 0.0–0.1)
Immature Granulocytes: 0 %
Lymphocytes Absolute: 2.3 10*3/uL (ref 0.7–3.1)
Lymphs: 36 %
MCH: 32.5 pg (ref 26.6–33.0)
MCHC: 34 g/dL (ref 31.5–35.7)
MCV: 96 fL (ref 79–97)
Monocytes Absolute: 0.6 10*3/uL (ref 0.1–0.9)
Monocytes: 9 %
Neutrophils Absolute: 3.2 10*3/uL (ref 1.4–7.0)
Neutrophils: 51 %
Platelets: 304 10*3/uL (ref 150–450)
RBC: 4.92 x10E6/uL (ref 4.14–5.80)
RDW: 12.3 % (ref 11.6–15.4)
WBC: 6.3 10*3/uL (ref 3.4–10.8)

## 2022-02-26 LAB — CMP14+EGFR
ALT: 26 IU/L (ref 0–44)
AST: 24 IU/L (ref 0–40)
Albumin/Globulin Ratio: 1.7 (ref 1.2–2.2)
Albumin: 4.5 g/dL (ref 3.9–4.9)
Alkaline Phosphatase: 104 IU/L (ref 44–121)
BUN/Creatinine Ratio: 11 (ref 10–24)
BUN: 11 mg/dL (ref 8–27)
Bilirubin Total: 0.3 mg/dL (ref 0.0–1.2)
CO2: 22 mmol/L (ref 20–29)
Calcium: 9.9 mg/dL (ref 8.6–10.2)
Chloride: 99 mmol/L (ref 96–106)
Creatinine, Ser: 1 mg/dL (ref 0.76–1.27)
Globulin, Total: 2.6 g/dL (ref 1.5–4.5)
Glucose: 92 mg/dL (ref 70–99)
Potassium: 4.9 mmol/L (ref 3.5–5.2)
Sodium: 136 mmol/L (ref 134–144)
Total Protein: 7.1 g/dL (ref 6.0–8.5)
eGFR: 82 mL/min/{1.73_m2} (ref >59)

## 2022-02-26 LAB — LIPID PANEL
Chol/HDL Ratio: 2.8 ratio (ref 0.0–5.0)
Cholesterol, Total: 185 mg/dL (ref 100–199)
HDL: 65 mg/dL (ref >39)
LDL Chol Calc (NIH): 105 mg/dL — ABNORMAL HIGH (ref 0–99)
Triglycerides: 79 mg/dL (ref 0–149)
VLDL Cholesterol Cal: 15 mg/dL (ref 5–40)

## 2022-02-26 LAB — TESTOSTERONE,FREE AND TOTAL
Testosterone, Free: 5.5 pg/mL — ABNORMAL LOW (ref 6.6–18.1)
Testosterone: 355 ng/dL (ref 264–916)

## 2022-03-28 ENCOUNTER — Other Ambulatory Visit: Payer: Self-pay | Admitting: Nurse Practitioner

## 2022-03-28 DIAGNOSIS — E559 Vitamin D deficiency, unspecified: Secondary | ICD-10-CM

## 2022-03-28 DIAGNOSIS — Z8719 Personal history of other diseases of the digestive system: Secondary | ICD-10-CM

## 2022-03-28 DIAGNOSIS — R109 Unspecified abdominal pain: Secondary | ICD-10-CM

## 2022-04-16 ENCOUNTER — Other Ambulatory Visit: Payer: Self-pay | Admitting: Nurse Practitioner

## 2022-04-16 DIAGNOSIS — R051 Acute cough: Secondary | ICD-10-CM

## 2022-04-25 ENCOUNTER — Ambulatory Visit (INDEPENDENT_AMBULATORY_CARE_PROVIDER_SITE_OTHER): Payer: Medicare Other | Admitting: Nurse Practitioner

## 2022-04-25 ENCOUNTER — Encounter: Payer: Self-pay | Admitting: Nurse Practitioner

## 2022-04-25 VITALS — BP 127/79 | HR 81 | Temp 97.7°F | Resp 20 | Ht 68.0 in | Wt 194.0 lb

## 2022-04-25 DIAGNOSIS — Z0000 Encounter for general adult medical examination without abnormal findings: Secondary | ICD-10-CM

## 2022-04-25 NOTE — Patient Instructions (Signed)

## 2022-04-25 NOTE — Progress Notes (Signed)
Subjective:   Carl Mccullough is a 67 y.o. male who presents for a Welcome to Medicare exam.   Review of Systems: Review of Systems  Constitutional:  Negative for diaphoresis and weight loss.  Eyes:  Negative for blurred vision, double vision and pain.  Respiratory:  Negative for shortness of breath.   Cardiovascular:  Negative for chest pain, palpitations, orthopnea and leg swelling.  Gastrointestinal:  Negative for abdominal pain.  Skin:  Negative for rash.  Neurological:  Negative for dizziness, sensory change, loss of consciousness, weakness and headaches.  Endo/Heme/Allergies:  Negative for polydipsia. Does not bruise/bleed easily.  Psychiatric/Behavioral:  Negative for memory loss. The patient does not have insomnia.   All other systems reviewed and are negative.   Cardiac Risk Factors include: advanced age (>62mn, >>65women);male gender;sedentary lifestyle     Objective:    Today's Vitals   04/25/22 0932  BP: 127/79  Pulse: 81  Resp: 20  Temp: 97.7 F (36.5 C)  TempSrc: Temporal  SpO2: 98%  Weight: 194 lb (88 kg)  Height: '5\' 8"'$  (1.727 m)   Body mass index is 29.5 kg/m.  Medications Outpatient Encounter Medications as of 04/25/2022  Medication Sig   Ascorbic Acid (VITAMIN C) 1000 MG tablet Take 1,000 mg by mouth daily.   cetirizine (ZYRTEC) 10 MG tablet TAKE 1 TABLET BY MOUTH EVERY DAY   fluticasone (FLONASE) 50 MCG/ACT nasal spray Place 2 sprays into both nostrils daily.   lisinopril-hydrochlorothiazide (ZESTORETIC) 10-12.5 MG tablet Take 1 tablet by mouth daily.   meloxicam (MOBIC) 15 MG tablet TAKE 1 TABLET (15 MG TOTAL) BY MOUTH DAILY.   tadalafil (CIALIS) 20 MG tablet Take 0.5-1 tablets (10-20 mg total) by mouth every other day as needed for erectile dysfunction.   Vitamin D, Ergocalciferol, (DRISDOL) 1.25 MG (50000 UNIT) CAPS capsule TAKE 1 CAPSULE (50,000 UNITS TOTAL) BY MOUTH EVERY 7 (SEVEN) DAYS   Facility-Administered Encounter Medications as of  04/25/2022  Medication   methylPREDNISolone acetate (DEPO-MEDROL) injection 80 mg     History: Past Medical History:  Diagnosis Date   Adenomatous colon polyp    Benign prostatic hyperplasia    DDD (degenerative disc disease), lumbosacral    Diverticulitis    History of hypertension off bp meds since 2018 large weight loss   Hyperlipidemia    Inguinal hernia bilateral, non-recurrent    Internal hemorrhoids    Vitamin D deficiency    Past Surgical History:  Procedure Laterality Date   CYSTOSCOPY  [id number]  INGUINAL HERNIA REPAIR N/A 06/02/2019   Procedure: LAPAROSCOPIC BILATERAL INGUINAL HERNIA REPAIR, PRIMARY UMBILICAL HERNIA REPAIR;  Surgeon: GMichael Boston MD;  Location: [facility]  Service: General;  Laterality: N/A;  GENERAL WITH ERAS   neck stitches  yrs ago   cut neck with chainsaw   TONSILLECTOMY      Family History  Problem Relation Age of Onset   Hypertension Mother    Alzheimer's disease Mother    Chronic Renal Failure Mother    Colon cancer Father    Bladder Cancer Father    Hypertension Brother    Rectal cancer Neg Hx    Stomach cancer Neg Hx    Esophageal cancer Neg Hx    Colon polyps Neg Hx    Social History   Occupational History   Occupation: Retired  Tobacco Use   Smoking status: Never   Smokeless tobacco: Never  Vaping Use   Vaping Use: Never used  Substance and Sexual  Activity   Alcohol use: Yes    Comment: occ   Drug use: No   Sexual activity: Yes   Tobacco Counseling Does not smoke  Immunizations and Health Maintenance Immunization History  Administered Date(s) Administered   Fluad Quad(high Dose 65+) 02/07/2020, 01/07/2021, 02/18/2022   Influenza, High Dose Seasonal PF 12/28/2018   Influenza,inj,Quad PF,6+ Mos 01/03/2013, 02/21/2014, 12/08/2014, 11/28/2015, 12/19/2016, 12/22/2017   Pneumococcal Polysaccharide-23 12/28/2018   Tdap 12/12/2015   Zoster Recombinat (Shingrix) 12/28/2018, 03/16/2019   Health  Maintenance Due  Topic Date Due   Medicare Annual Wellness (AWV)  Never done   COVID-19 Vaccine (1) Never done    Activities of Daily Living    04/25/2022    9:42 AM  In your present state of health, do you have any difficulty performing the following activities:  Hearing? 0  Vision? 0  Difficulty concentrating or making decisions? 0  Walking or climbing stairs? 0  Dressing or bathing? 0  Doing errands, shopping? 0  Preparing Food and eating ? N  Using the Toilet? N  In the past six months, have you accidently leaked urine? N  Do you have problems with loss of bowel control? N  Managing your Medications? N  Managing your Finances? N  Housekeeping or managing your Housekeeping? N    Physical Exam  EKG- NSR  or other factors deemed appropriate based on the beneficiary's medical and social history and current clinical standards.  Advanced Directives: Does Patient Have a Medical Advance Directive?: No Would patient like information on creating a medical advance directive?: No - Patient declined    Assessment:    This is a routine wellness  examination for this patient . Welcome to medicare  Vision/Hearing screen No results found.  Dietary issues and exercise activities discussed:  Current Exercise Habits: The patient does not participate in regular exercise at present, Exercise limited by: None identified   Goals      DIET - EAT MORE FRUITS AND VEGETABLES     Exercise 150 min/wk Moderate Activity        Depression Screen    02/18/2022    8:40 AM 01/22/2022    9:58 AM 12/05/2021    8:40 AM 11/27/2021   12:00 PM  PHQ 2/9 Scores  PHQ - 2 Score 0 0 0 0  PHQ- 9 Score 0 0 0      Fall Risk    04/25/2022    9:42 AM  [place] in the past year? 0    Cognitive Function    04/25/2022    9:42 AM  MMSE - Mini Mental State Exam  Orientation to time 1  Orientation to Place 5  Registration 3  Attention/ Calculation 5  Recall 0  Language- name 2 objects 2   Language- repeat 1  Language- follow 3 step command 3  Language- read & follow direction 1  Write a sentence 1  Copy design 1  Total score 23        Patient Care Team: Chevis Pretty, FNP as PCP - General (Family Medicine) Michael Boston, MD as Consulting Physician (General Surgery) Pyrtle, Lajuan Lines, MD as Consulting Physician (Gastroenterology) Irine Seal, MD as Attending Physician (Urology)     Plan:   Welcome to medicare  I have personally reviewed and noted the following in the patient's chart:   Medical and social history Use of alcohol, tobacco or illicit drugs  Current medications and supplements Functional ability and status Nutritional status  Physical activity Advanced directives List of other physicians Hospitalizations, surgeries, and ER visits in previous 12 months Vitals Screenings to include cognitive, depression, and falls Referrals and appointments  In addition, I have reviewed and discussed with patient certain preventive protocols, quality metrics, and best practice recommendations. A written personalized care plan for preventive services as well as general preventive health recommendations were provided to patient.    [place], [place] 04/25/2022

## 2022-06-17 ENCOUNTER — Telehealth: Payer: Self-pay | Admitting: Internal Medicine

## 2022-06-17 NOTE — Telephone Encounter (Signed)
Patient needing medication refill on diverticulitis medication. Please advise.

## 2022-06-18 ENCOUNTER — Other Ambulatory Visit: Payer: Self-pay

## 2022-06-18 MED ORDER — METRONIDAZOLE 250 MG PO TABS: 250.0000 mg | ORAL_TABLET | Freq: Three times a day (TID) | ORAL | 0 refills | Status: DC

## 2022-06-18 MED ORDER — CIPROFLOXACIN HCL 500 MG PO TABS: 500.0000 mg | ORAL_TABLET | Freq: Two times a day (BID) | ORAL | 0 refills | Status: DC

## 2022-06-18 NOTE — Telephone Encounter (Signed)
Patient called to return phone call and is requesting to speak with a nurse as soon as one is available. Thank you.

## 2022-06-18 NOTE — Telephone Encounter (Signed)
Cipro 500 mg bid and metronidazole 250 mg tid  -- 7 days

## 2022-06-18 NOTE — Telephone Encounter (Signed)
Pt reports he is having his usual symptoms when he has a diverticulitis flare. Reports he is having LLQ abd pain that started Monday, he has not had a fever yet. He is requesting antibiotics. Please advise.

## 2022-06-18 NOTE — Telephone Encounter (Signed)
Left VM for patient to return call letting us know if he is having symptoms so that he can speak with a nurse.

## 2022-06-18 NOTE — Telephone Encounter (Signed)
Scripts sent to pharmacy and pt aware. 

## 2022-07-08 ENCOUNTER — Other Ambulatory Visit (INDEPENDENT_AMBULATORY_CARE_PROVIDER_SITE_OTHER): Payer: 59

## 2022-07-08 DIAGNOSIS — Z8719 Personal history of other diseases of the digestive system: Secondary | ICD-10-CM

## 2022-07-08 DIAGNOSIS — R109 Unspecified abdominal pain: Secondary | ICD-10-CM

## 2022-07-08 DIAGNOSIS — E559 Vitamin D deficiency, unspecified: Secondary | ICD-10-CM

## 2022-07-08 LAB — CBC WITH DIFFERENTIAL/PLATELET
Basophils Absolute: 0.1 10*3/uL (ref 0.0–0.1)
Basophils Relative: 0.9 % (ref 0.0–3.0)
Eosinophils Absolute: 0.1 10*3/uL (ref 0.0–0.7)
Eosinophils Relative: 0.7 % (ref 0.0–5.0)
HCT: 46.2 % (ref 39.0–52.0)
Hemoglobin: 15.3 g/dL (ref 13.0–17.0)
Lymphocytes Relative: 28 % (ref 12.0–46.0)
Lymphs Abs: 2.7 10*3/uL (ref 0.7–4.0)
MCHC: 33.1 g/dL (ref 30.0–36.0)
MCV: 97.1 fl (ref 78.0–100.0)
Monocytes Absolute: 1.1 10*3/uL — ABNORMAL HIGH (ref 0.1–1.0)
Monocytes Relative: 11.1 % (ref 3.0–12.0)
Neutro Abs: 5.6 10*3/uL (ref 1.4–7.7)
Neutrophils Relative %: 59.3 % (ref 43.0–77.0)
Platelets: 252 10*3/uL (ref 150.0–400.0)
RBC: 4.76 Mil/uL (ref 4.22–5.81)
RDW: 13.2 % (ref 11.5–15.5)
WBC: 9.5 10*3/uL (ref 4.0–10.5)

## 2022-07-08 LAB — COMPREHENSIVE METABOLIC PANEL
ALT: 15 U/L (ref 0–53)
AST: 13 U/L (ref 0–37)
Albumin: 4.2 g/dL (ref 3.5–5.2)
Alkaline Phosphatase: 88 U/L (ref 39–117)
BUN: 12 mg/dL (ref 6–23)
CO2: 28 mEq/L (ref 19–32)
Calcium: 9.7 mg/dL (ref 8.4–10.5)
Chloride: 98 mEq/L (ref 96–112)
Creatinine, Ser: 1.02 mg/dL (ref 0.40–1.50)
GFR: 75.39 mL/min (ref >60.00)
Glucose, Bld: 94 mg/dL (ref 70–99)
Potassium: 5 mEq/L (ref 3.5–5.1)
Sodium: 134 mEq/L — ABNORMAL LOW (ref 135–145)
Total Bilirubin: 0.6 mg/dL (ref 0.2–1.2)
Total Protein: 7.8 g/dL (ref 6.0–8.3)

## 2022-07-08 MED ORDER — AMOXICILLIN-POT CLAVULANATE 875-125 MG PO TABS: 1.0000 | ORAL_TABLET | Freq: Two times a day (BID) | ORAL | 0 refills | Status: DC

## 2022-07-08 NOTE — Telephone Encounter (Signed)
Inbound call from patient requesting to speak with a nurse in regards to diverticulitis flare up. Please advise.  Thank you

## 2022-07-08 NOTE — Telephone Encounter (Signed)
He was treated 2 weeks ago with Cipro and Flagyl and now having ongoing issues  I would recommend he come for CBC and CMP Augmentin 875 mg twice daily x 7 days If not significantly improved within 48 hours he needs to let us know and I would recommend a CT scan of abdomen pelvis with contrast

## 2022-07-08 NOTE — Telephone Encounter (Signed)
Pt calling states he thinks he is having another diverticulitis flare, reports it started Sunday night. States he has had pain all across the bottom of his stomach and this time the pain also went down in his groin area. He had a temp yesterday afternoon of 100.1. Reports he had 3 antibiotic pills left so he took one at 4pm and another one around 11pm. He is requesting more antibiotics be sent in. Please advise.

## 2022-07-08 NOTE — Telephone Encounter (Signed)
Spoke with pt and he is aware of Dr. Dorita Sciara recommendations. Orders in for labs and script sent to pharmacy.

## 2022-07-15 ENCOUNTER — Other Ambulatory Visit: Payer: Self-pay | Admitting: Nurse Practitioner

## 2022-07-15 DIAGNOSIS — E559 Vitamin D deficiency, unspecified: Secondary | ICD-10-CM

## 2022-08-19 ENCOUNTER — Encounter: Payer: Self-pay | Admitting: Nurse Practitioner

## 2022-08-19 ENCOUNTER — Ambulatory Visit (INDEPENDENT_AMBULATORY_CARE_PROVIDER_SITE_OTHER): Payer: Medicare Other | Admitting: Nurse Practitioner

## 2022-08-19 VITALS — BP 133/85 | HR 75 | Temp 98.2°F | Resp 20 | Ht 68.0 in | Wt 193.0 lb

## 2022-08-19 DIAGNOSIS — Z6828 Body mass index (BMI) 28.0-28.9, adult: Secondary | ICD-10-CM

## 2022-08-19 DIAGNOSIS — E559 Vitamin D deficiency, unspecified: Secondary | ICD-10-CM

## 2022-08-19 DIAGNOSIS — K5730 Diverticulosis of large intestine without perforation or abscess without bleeding: Secondary | ICD-10-CM

## 2022-08-19 DIAGNOSIS — J3089 Other allergic rhinitis: Secondary | ICD-10-CM | POA: Diagnosis not present

## 2022-08-19 DIAGNOSIS — I10 Essential (primary) hypertension: Secondary | ICD-10-CM | POA: Diagnosis not present

## 2022-08-19 DIAGNOSIS — Z23 Encounter for immunization: Secondary | ICD-10-CM

## 2022-08-19 DIAGNOSIS — E782 Mixed hyperlipidemia: Secondary | ICD-10-CM

## 2022-08-19 DIAGNOSIS — N400 Enlarged prostate without lower urinary tract symptoms: Secondary | ICD-10-CM

## 2022-08-19 DIAGNOSIS — E349 Endocrine disorder, unspecified: Secondary | ICD-10-CM | POA: Diagnosis not present

## 2022-08-19 DIAGNOSIS — B001 Herpesviral vesicular dermatitis: Secondary | ICD-10-CM

## 2022-08-19 LAB — CMP14+EGFR

## 2022-08-19 LAB — CBC WITH DIFFERENTIAL/PLATELET
Hematocrit: 46.5 % (ref 37.5–51.0)
Immature Grans (Abs): 0 10*3/uL (ref 0.0–0.1)
RDW: 12.2 % (ref 11.6–15.4)

## 2022-08-19 LAB — VITAMIN D 25 HYDROXY (VIT D DEFICIENCY, FRACTURES)

## 2022-08-19 MED ORDER — VITAMIN D (ERGOCALCIFEROL) 1.25 MG (50000 UNIT) PO CAPS: 50000.0000 [IU] | ORAL_CAPSULE | ORAL | 0 refills | Status: DC

## 2022-08-19 MED ORDER — LISINOPRIL-HYDROCHLOROTHIAZIDE 10-12.5 MG PO TABS
1.0000 | ORAL_TABLET | Freq: Every day | ORAL | 1 refills | Status: DC
Start: 2022-08-19 — End: 2023-02-26

## 2022-08-19 MED ORDER — VALACYCLOVIR HCL 1 G PO TABS
ORAL_TABLET | ORAL | 0 refills | Status: DC
Start: 2022-08-19 — End: 2023-08-20

## 2022-08-19 MED ORDER — FLUTICASONE PROPIONATE 50 MCG/ACT NA SUSP: 2.0000 | Freq: Every day | NASAL | 6 refills | Status: DC

## 2022-08-19 MED ORDER — CETIRIZINE HCL 10 MG PO TABS: 10.0000 mg | ORAL_TABLET | Freq: Every day | ORAL | 1 refills | Status: DC

## 2022-08-19 NOTE — Patient Instructions (Signed)
Diverticulosis  Diverticulosis is when small pouches called diverticula form in the wall of the colon. The colon is where water is absorbed. It is also where poop (stool) is formed. The pouches form when the inside layer of the colon pushes through weak spots in the outer layers of the colon. You may have a few pouches or many of them. In most cases, the pouches do not cause problems. If they become inflamed or infected, you may have a condition called diverticulitis. What are the causes? The cause of this condition is not known. What increases the risk? You are more likely to get this condition if: You are older than 69 years of age. You do not eat enough fiber or you get constipated a lot. You are overweight. You do not get enough exercise. You smoke. You take over-the-counter pain medicines. You have a family history of the condition. What are the signs or symptoms? In most people, there are no symptoms. If you do have symptoms, they may include: Bloating. Stomach cramps. Constipation or diarrhea. Pain in the lower left side of your abdomen. How is this diagnosed? This condition is often diagnosed during an exam for other colon problems. It may be diagnosed when you have: A colonoscopy. This is when a tube with a camera on the end is used to look at your colon. A barium enema. This is an X-ray exam that uses dye to look at your colon. A CT scan. How is this treated? You may not need treatment. Your health care provider will tell you what you can do at home to help prevent problems. You may need treatment if you have symptoms or if you have had diverticulitis before. You may be told to: Eat a high-fiber diet. Take medicine to relax your colon. Lose weight. Follow these instructions at home: Medicines Take over-the-counter and prescription medicines only as told by your provider. If told, take a fiber supplement or probiotic. Managing constipation Your condition may cause  constipation. To prevent or treat constipation, you may need to: Drink enough fluid to keep your pee (urine) pale yellow. Take over-the-counter or prescription medicines. Eat foods that are high in fiber, such as beans, whole grains, and fresh fruits and vegetables. Limit foods that are high in fat and processed sugars, such as fried or sweet foods. Try not to strain when you poop. Contact a health care provider if: Your symptoms get worse all of a sudden. You have pain in your abdomen that gets worse. You have bloating or stomach cramps. You continue to have frequent constipation. You have a fever or chills. You vomit. Your poop is bloody, black, or tarry. This information is not intended to replace advice given to you by your health care provider. Make sure you discuss any questions you have with your health care provider. Document Revised: 10/31/2021 Document Reviewed: 10/31/2021 Elsevier Patient Education  2024 [employer].

## 2022-08-19 NOTE — Progress Notes (Signed)
Subjective:    Patient ID: Carl Mccullough, male    DOB: [birth year], 67 y.o.   MRN: [id number]   Chief Complaint: medical management of chronic issues     HPI:  Carl Mccullough is a 67 y.o. who identifies as a male who was assigned male at birth.   Social history: Lives with: wife Work history: retired last October   Comes in today for follow up of the following chronic medical issues:  1. Primary hypertension No c/o chest pain, sob or headache. Does not check blood pressure at home. BP Readings from Last 3 Encounters:  04/25/22 127/79  02/18/22 120/82  01/22/22 129/80     2. Mixed hyperlipidemia Does watch diet and stays very active. No dedicated exercise Lab Results  Component Value Date   CHOL 185 02/18/2022   HDL 65 02/18/2022   LDLCALC 105 (H) 02/18/2022   TRIG 79 02/18/2022   CHOLHDL 2.8 02/18/2022   The 10-year ASCVD risk score (Arnett DK, et al., 2019) is: 16.4%    3. Diverticulosis of colon without hemorrhage Had 2 flare ups in May. Took flagyl and augmentin which worked well for him.  4. Testosterone deficiency Is on no testosterone replacement Lab Results  Component Value Date   TESTOSTERONE 355 02/18/2022    5. Vitamin D deficiency No vitamind replacement Last vitamin D Lab Results  Component Value Date   VD25OH 39.4 08/03/2018     6. Benign prostatic hyperplasia without lower urinary tract symptoms No voiding issues Lab Results  Component Value Date   PSA1 1.3 02/18/2022   PSA1 1.0 02/08/2021   PSA1 1.2 02/07/2020   PSA 0.9 09/29/2013      7. BMI 28.0-28.9,adult No recent weight changes Wt Readings from Last 3 Encounters:  08/19/22 193 lb (87.5 kg)  04/25/22 194 lb (88 kg)  02/18/22 194 lb (88 kg)   BMI Readings from Last 3 Encounters:  08/19/22 29.35 kg/m  04/25/22 29.50 kg/m  02/18/22 29.50 kg/m     New complaints: None today  No Known Allergies Outpatient Encounter Medications as of 08/19/2022  Medication Sig    amoxicillin-clavulanate (AUGMENTIN) 875-125 MG tablet Take 1 tablet by mouth 2 (two) times daily.   Ascorbic Acid (VITAMIN C) 1000 MG tablet Take 1,000 mg by mouth daily.   cetirizine (ZYRTEC) 10 MG tablet TAKE 1 TABLET BY MOUTH EVERY DAY   fluticasone (FLONASE) 50 MCG/ACT nasal spray Place 2 sprays into both nostrils daily.   lisinopril-hydrochlorothiazide (ZESTORETIC) 10-12.5 MG tablet Take 1 tablet by mouth daily.   meloxicam (MOBIC) 15 MG tablet TAKE 1 TABLET (15 MG TOTAL) BY MOUTH DAILY.   tadalafil (CIALIS) 20 MG tablet Take 0.5-1 tablets (10-20 mg total) by mouth every other day as needed for erectile dysfunction.   Vitamin D, Ergocalciferol, (DRISDOL) 1.25 MG (50000 UNIT) CAPS capsule TAKE 1 CAPSULE (50,000 UNITS TOTAL) BY MOUTH EVERY 7 (SEVEN) DAYS   Facility-Administered Encounter Medications as of 08/19/2022  Medication   methylPREDNISolone acetate (DEPO-MEDROL) injection 80 mg    Past Surgical History:  Procedure Laterality Date   CYSTOSCOPY  [id number]   INGUINAL HERNIA REPAIR N/A 06/02/2019   Procedure: LAPAROSCOPIC BILATERAL INGUINAL HERNIA REPAIR, PRIMARY UMBILICAL HERNIA REPAIR;  Surgeon: Karie Soda, MD;  Location: [facility];  Service: General;  Laterality: N/A;  GENERAL WITH ERAS   neck stitches  yrs ago   cut neck with chainsaw   TONSILLECTOMY      Family History  Problem Relation  Age of Onset   Hypertension Mother    Alzheimer's disease Mother    Chronic Renal Failure Mother    Colon cancer Father    Bladder Cancer Father    Hypertension Brother    Rectal cancer Neg Hx    Stomach cancer Neg Hx    Esophageal cancer Neg Hx    Colon polyps Neg Hx       Controlled substance contract: n/a      Review of Systems  Constitutional:  Negative for diaphoresis.  Eyes:  Negative for pain.  Respiratory:  Negative for shortness of breath.   Cardiovascular:  Negative for chest pain, palpitations and leg swelling.  Gastrointestinal:  Negative  for abdominal pain.  Endocrine: Negative for polydipsia.  Skin:  Negative for rash.  Neurological:  Negative for dizziness, weakness and headaches.  Hematological:  Does not bruise/bleed easily.  All other systems reviewed and are negative.      Objective:   Physical Exam Vitals and nursing note reviewed.  Constitutional:      Appearance: Normal appearance. He is well-developed.  HENT:     Head: Normocephalic.     Nose: Nose normal.     Mouth/Throat:     Mouth: Mucous membranes are moist.     Pharynx: Oropharynx is clear.  Eyes:     Pupils: Pupils are equal, round, and reactive to light.  Neck:     Thyroid: No thyroid mass or thyromegaly.     Vascular: No carotid bruit or JVD.     Trachea: Phonation normal.  Cardiovascular:     Rate and Rhythm: Normal rate and regular rhythm.  Pulmonary:     Effort: Pulmonary effort is normal. No respiratory distress.     Breath sounds: Normal breath sounds.  Abdominal:     General: Bowel sounds are normal.     Palpations: Abdomen is soft.     Tenderness: There is no abdominal tenderness.  Musculoskeletal:        General: Normal range of motion.     Cervical back: Normal range of motion and neck supple.  Lymphadenopathy:     Cervical: No cervical adenopathy.  Skin:    General: Skin is warm and dry.  Neurological:     Mental Status: He is alert and oriented to person, place, and time.  Psychiatric:        Behavior: Behavior normal.        Thought Content: Thought content normal.        Judgment: Judgment normal.    BP 133/85   Pulse 75   Temp 98.2 F (36.8 C) (Temporal)   Resp 20   Ht 5\' 8"  (1.727 m)   Wt 193 lb (87.5 kg)   SpO2 99%   BMI 29.35 kg/m         Assessment & Plan:   Carl Mccullough comes in today with chief complaint of Medical Management of Chronic Issues   Diagnosis and orders addressed:  1. Primary hypertension Low sodium diet - CBC with Differential/Platelet - CMP14+EGFR -  lisinopril-hydrochlorothiazide (ZESTORETIC) 10-12.5 MG tablet; Take 1 tablet by mouth daily.  Dispense: 90 tablet; Refill: 1  2. Mixed hyperlipidemia Low fat diet - Lipid panel  3. Diverticulosis of colon without hemorrhage Watch diet to prevent flare up  4. Testosterone deficiency Labs pending  5. Vitamin D deficiency Labs pending - VITAMIN D 25 Hydroxy (Vit-D Deficiency, Fractures) - Vitamin D, Ergocalciferol, (DRISDOL) 1.25 MG (50000 UNIT) CAPS capsule; Take 1  capsule (50,000 Units total) by mouth every 7 (seven) days.  Dispense: 12 capsule; Refill: 0  6. Benign prostatic hyperplasia without lower urinary tract symptoms Report any voiding issues  7. BMI 28.0-28.9,adult Discussed diet and exercise for person with BMI >25 Will recheck weight in 3-6 months   8. Non-seasonal allergic rhinitis, unspecified trigger - cetirizine (ZYRTEC) 10 MG tablet; Take 1 tablet (10 mg total) by mouth daily.  Dispense: 90 tablet; Refill: 1 - fluticasone (FLONASE) 50 MCG/ACT nasal spray; Place 2 sprays into both nostrils daily.  Dispense: 16 g; Refill: 6  9. Fever blister - valACYclovir (VALTREX) 1000 MG tablet; 2 po bid for 1 day at fever blister onset  Dispense: 20 tablet; Refill: 0   Labs pending Health Maintenance reviewed Diet and exercise encouraged  Follow up plan: 6 months   Mary-Margaret Daphine Deutscher, FNP

## 2022-08-19 NOTE — Addendum Note (Signed)
Addended by: Cleda Daub on: 08/19/2022 04:22 PM   Modules accepted: Orders

## 2022-08-20 LAB — CBC WITH DIFFERENTIAL/PLATELET
Basophils Absolute: 0.1 10*3/uL (ref 0.0–0.2)
Immature Granulocytes: 0 %
Lymphs: 39 %
MCH: 32.5 pg (ref 26.6–33.0)
MCHC: 34.6 g/dL (ref 31.5–35.7)
Monocytes Absolute: 0.6 10*3/uL (ref 0.1–0.9)
Neutrophils: 47 %
RBC: 4.96 x10E6/uL (ref 4.14–5.80)
WBC: 5.7 10*3/uL (ref 3.4–10.8)

## 2022-08-20 LAB — LIPID PANEL
Chol/HDL Ratio: 2.7 ratio (ref 0.0–5.0)
HDL: 72 mg/dL (ref >39)
LDL Chol Calc (NIH): 100 mg/dL — ABNORMAL HIGH (ref 0–99)
Triglycerides: 105 mg/dL (ref 0–149)

## 2022-08-20 LAB — TESTOSTERONE,FREE AND TOTAL: Testosterone: 379 ng/dL (ref 264–916)

## 2022-08-20 LAB — CMP14+EGFR
Alkaline Phosphatase: 110 IU/L (ref 44–121)
BUN/Creatinine Ratio: 12 (ref 10–24)
Bilirubin Total: 0.5 mg/dL (ref 0.0–1.2)
Total Protein: 7.3 g/dL (ref 6.0–8.5)
eGFR: 74 mL/min/{1.73_m2} (ref >59)

## 2022-08-21 LAB — CMP14+EGFR
CO2: 22 mmol/L (ref 20–29)
Chloride: 97 mmol/L (ref 96–106)
Creatinine, Ser: 1.09 mg/dL (ref 0.76–1.27)
Globulin, Total: 2.6 g/dL (ref 1.5–4.5)
Glucose: 93 mg/dL (ref 70–99)
Potassium: 5 mmol/L (ref 3.5–5.2)

## 2022-08-21 LAB — CBC WITH DIFFERENTIAL/PLATELET
Basos: 1 %
EOS (ABSOLUTE): 0.2 10*3/uL (ref 0.0–0.4)
Eos: 3 %
Hemoglobin: 16.1 g/dL (ref 13.0–17.7)
Lymphocytes Absolute: 2.2 10*3/uL (ref 0.7–3.1)
MCV: 94 fL (ref 79–97)
Monocytes: 10 %
Neutrophils Absolute: 2.7 10*3/uL (ref 1.4–7.0)
Platelets: 276 10*3/uL (ref 150–450)

## 2022-08-21 LAB — LIPID PANEL
Cholesterol, Total: 191 mg/dL (ref 100–199)
VLDL Cholesterol Cal: 19 mg/dL (ref 5–40)

## 2022-09-05 DIAGNOSIS — S62522B Displaced fracture of distal phalanx of left thumb, initial encounter for open fracture: Secondary | ICD-10-CM | POA: Diagnosis not present

## 2022-09-05 DIAGNOSIS — S68012A Complete traumatic metacarpophalangeal amputation of left thumb, initial encounter: Secondary | ICD-10-CM | POA: Diagnosis not present

## 2022-09-05 DIAGNOSIS — Z23 Encounter for immunization: Secondary | ICD-10-CM | POA: Diagnosis not present

## 2022-09-05 DIAGNOSIS — S68029A Partial traumatic metacarpophalangeal amputation of unspecified thumb, initial encounter: Secondary | ICD-10-CM | POA: Diagnosis not present

## 2022-09-05 DIAGNOSIS — S68522A Partial traumatic transphalangeal amputation of left thumb, initial encounter: Secondary | ICD-10-CM | POA: Diagnosis not present

## 2022-09-10 DIAGNOSIS — S62522D Displaced fracture of distal phalanx of left thumb, subsequent encounter for fracture with routine healing: Secondary | ICD-10-CM | POA: Diagnosis not present

## 2022-09-17 DIAGNOSIS — S68522D Partial traumatic transphalangeal amputation of left thumb, subsequent encounter: Secondary | ICD-10-CM | POA: Diagnosis not present

## 2022-10-01 DIAGNOSIS — S68522D Partial traumatic transphalangeal amputation of left thumb, subsequent encounter: Secondary | ICD-10-CM | POA: Diagnosis not present

## 2022-10-01 DIAGNOSIS — S62522D Displaced fracture of distal phalanx of left thumb, subsequent encounter for fracture with routine healing: Secondary | ICD-10-CM | POA: Diagnosis not present

## 2022-10-09 DIAGNOSIS — S62522B Displaced fracture of distal phalanx of left thumb, initial encounter for open fracture: Secondary | ICD-10-CM | POA: Diagnosis not present

## 2022-10-09 DIAGNOSIS — I96 Gangrene, not elsewhere classified: Secondary | ICD-10-CM | POA: Diagnosis not present

## 2022-10-21 DIAGNOSIS — S62522D Displaced fracture of distal phalanx of left thumb, subsequent encounter for fracture with routine healing: Secondary | ICD-10-CM | POA: Diagnosis not present

## 2022-10-21 DIAGNOSIS — Z9889 Other specified postprocedural states: Secondary | ICD-10-CM | POA: Diagnosis not present

## 2022-10-21 DIAGNOSIS — Z4889 Encounter for other specified surgical aftercare: Secondary | ICD-10-CM | POA: Diagnosis not present

## 2022-11-19 DIAGNOSIS — S62522A Displaced fracture of distal phalanx of left thumb, initial encounter for closed fracture: Secondary | ICD-10-CM | POA: Diagnosis not present

## 2022-11-19 DIAGNOSIS — S68522A Partial traumatic transphalangeal amputation of left thumb, initial encounter: Secondary | ICD-10-CM | POA: Diagnosis not present

## 2022-11-19 DIAGNOSIS — S62522D Displaced fracture of distal phalanx of left thumb, subsequent encounter for fracture with routine healing: Secondary | ICD-10-CM | POA: Diagnosis not present

## 2022-12-31 DIAGNOSIS — S62522D Displaced fracture of distal phalanx of left thumb, subsequent encounter for fracture with routine healing: Secondary | ICD-10-CM | POA: Diagnosis not present

## 2022-12-31 DIAGNOSIS — S68522A Partial traumatic transphalangeal amputation of left thumb, initial encounter: Secondary | ICD-10-CM | POA: Diagnosis not present

## 2023-02-26 ENCOUNTER — Ambulatory Visit (INDEPENDENT_AMBULATORY_CARE_PROVIDER_SITE_OTHER): Payer: Medicare Other | Admitting: Nurse Practitioner

## 2023-02-26 ENCOUNTER — Encounter: Payer: Self-pay | Admitting: Nurse Practitioner

## 2023-02-26 VITALS — BP 125/84 | HR 82 | Temp 97.7°F | Resp 20 | Ht 68.0 in | Wt 188.0 lb

## 2023-02-26 DIAGNOSIS — Z23 Encounter for immunization: Secondary | ICD-10-CM

## 2023-02-26 DIAGNOSIS — K5730 Diverticulosis of large intestine without perforation or abscess without bleeding: Secondary | ICD-10-CM

## 2023-02-26 DIAGNOSIS — E559 Vitamin D deficiency, unspecified: Secondary | ICD-10-CM

## 2023-02-26 DIAGNOSIS — N5201 Erectile dysfunction due to arterial insufficiency: Secondary | ICD-10-CM

## 2023-02-26 DIAGNOSIS — E349 Endocrine disorder, unspecified: Secondary | ICD-10-CM

## 2023-02-26 DIAGNOSIS — Z6828 Body mass index (BMI) 28.0-28.9, adult: Secondary | ICD-10-CM

## 2023-02-26 DIAGNOSIS — N400 Enlarged prostate without lower urinary tract symptoms: Secondary | ICD-10-CM

## 2023-02-26 DIAGNOSIS — I10 Essential (primary) hypertension: Secondary | ICD-10-CM | POA: Diagnosis not present

## 2023-02-26 DIAGNOSIS — E782 Mixed hyperlipidemia: Secondary | ICD-10-CM

## 2023-02-26 DIAGNOSIS — R1312 Dysphagia, oropharyngeal phase: Secondary | ICD-10-CM | POA: Diagnosis not present

## 2023-02-26 MED ORDER — TADALAFIL 20 MG PO TABS
10.0000 mg | ORAL_TABLET | ORAL | 11 refills | Status: DC | PRN
Start: 2023-02-26 — End: 2023-08-20

## 2023-02-26 MED ORDER — LISINOPRIL-HYDROCHLOROTHIAZIDE 10-12.5 MG PO TABS
1.0000 | ORAL_TABLET | Freq: Every day | ORAL | 1 refills | Status: DC
Start: 2023-02-26 — End: 2023-08-20

## 2023-02-26 NOTE — Progress Notes (Signed)
 Subjective:    Patient ID: Carl Mccullough, male    DOB: [birth year], 68 y.o.   MRN: [id number]   Chief Complaint: medical management of chronic issues     HPI:  Carl Mccullough is a 68 y.o. who identifies as a male who was assigned male at birth.   Social history: Lives with: wife Work history: retired last October   Comes in today for follow up of the following chronic medical issues:  1. Primary hypertension No c/o chest pain, sob or headache. Does not check blood pressure at home. BP Readings from Last 3 Encounters:  08/19/22 133/85  04/25/22 127/79  02/18/22 120/82     2. Mixed hyperlipidemia Does watch diet and stays very active. No dedicated exercise Lab Results  Component Value Date   CHOL 191 08/19/2022   HDL 72 08/19/2022   LDLCALC 100 (H) 08/19/2022   TRIG 105 08/19/2022   CHOLHDL 2.7 08/19/2022   The 10-year ASCVD risk score (Arnett DK, et al., 2019) is: 18.1%    3. Diverticulosis of colon without hemorrhage Had 2 flare ups in May. Took flagyl  and augmentin  which worked well for him. No flare ups since then.  4. Testosterone  deficiency Is on no testosterone  replacement Lab Results  Component Value Date   TESTOSTERONE  379 08/19/2022    5. Vitamin D  deficiency No vitamind replacement Last vitamin D  Lab Results  Component Value Date   VD25OH 58.1 08/19/2022     6. Benign prostatic hyperplasia without lower urinary tract symptoms No voiding issues Lab Results  Component Value Date   PSA1 1.3 02/18/2022   PSA1 1.0 02/08/2021   PSA1 1.2 02/07/2020   PSA 0.9 09/29/2013      7. Erectile dysfunction Does not take anything currently  8. BMI 28.0-28.9,adult Weight is down 8lbs  Wt Readings from Last 3 Encounters:  02/26/23 188 lb (85.3 kg)  08/19/22 193 lb (87.5 kg)  04/25/22 194 lb (88 kg)   BP Readings from Last 3 Encounters:  02/26/23 125/84  08/19/22 133/85  04/25/22 127/79      New complaints: -Feels like a knot is in  his throat with trouble swallowing at times. - voiding- does not feel that he is emptying his bladder - can't smell- denies ever having covid  No Known Allergies Outpatient Encounter Medications as of 02/26/2023  Medication Sig   Ascorbic Acid (VITAMIN C) 1000 MG tablet Take 1,000 mg by mouth daily.   cetirizine  (ZYRTEC ) 10 MG tablet Take 1 tablet (10 mg total) by mouth daily.   fluticasone  (FLONASE ) 50 MCG/ACT nasal spray Place 2 sprays into both nostrils daily.   lisinopril -hydrochlorothiazide  (ZESTORETIC ) 10-12.5 MG tablet Take 1 tablet by mouth daily.   meloxicam  (MOBIC ) 15 MG tablet TAKE 1 TABLET (15 MG TOTAL) BY MOUTH DAILY.   tadalafil  (CIALIS ) 20 MG tablet Take 0.5-1 tablets (10-20 mg total) by mouth every other day as needed for erectile dysfunction.   valACYclovir  (VALTREX ) 1000 MG tablet 2 po bid for 1 day at fever blister onset   Vitamin D , Ergocalciferol , (DRISDOL ) 1.25 MG (50000 UNIT) CAPS capsule Take 1 capsule (50,000 Units total) by mouth every 7 (seven) days.   Facility-Administered Encounter Medications as of 02/26/2023  Medication   methylPREDNISolone  acetate (DEPO-MEDROL ) injection 80 mg    Past Surgical History:  Procedure Laterality Date   CYSTOSCOPY  [id number]   INGUINAL HERNIA REPAIR N/A 06/02/2019   Procedure: LAPAROSCOPIC BILATERAL INGUINAL HERNIA REPAIR, PRIMARY UMBILICAL HERNIA REPAIR;  Surgeon:  Sheldon Standing, MD;  Location: [facility];  Service: General;  Laterality: N/A;  GENERAL WITH ERAS   neck stitches  yrs ago   cut neck with chainsaw   TONSILLECTOMY      Family History  Problem Relation Age of Onset   Hypertension Mother    Alzheimer's disease Mother    Chronic Renal Failure Mother    Colon cancer Father    Bladder Cancer Father    Hypertension Brother    Rectal cancer Neg Hx    Stomach cancer Neg Hx    Esophageal cancer Neg Hx    Colon polyps Neg Hx       Controlled substance contract: n/a      Review of Systems   Constitutional:  Negative for diaphoresis.  Eyes:  Negative for pain.  Respiratory:  Negative for shortness of breath.   Cardiovascular:  Negative for chest pain, palpitations and leg swelling.  Gastrointestinal:  Negative for abdominal pain.  Endocrine: Negative for polydipsia.  Skin:  Negative for rash.  Neurological:  Negative for dizziness, weakness and headaches.  Hematological:  Does not bruise/bleed easily.  All other systems reviewed and are negative.      Objective:   Physical Exam Vitals and nursing note reviewed.  Constitutional:      Appearance: Normal appearance. He is well-developed.  HENT:     Head: Normocephalic.     Nose: Nose normal.     Mouth/Throat:     Mouth: Mucous membranes are moist.     Pharynx: Oropharynx is clear.  Eyes:     Pupils: Pupils are equal, round, and reactive to light.  Neck:     Thyroid : No thyroid  mass or thyromegaly.     Vascular: No carotid bruit or JVD.     Trachea: Phonation normal.  Cardiovascular:     Rate and Rhythm: Normal rate and regular rhythm.  Pulmonary:     Effort: Pulmonary effort is normal. No respiratory distress.     Breath sounds: Normal breath sounds.  Abdominal:     General: Bowel sounds are normal.     Palpations: Abdomen is soft.     Tenderness: There is no abdominal tenderness.  Musculoskeletal:        General: Normal range of motion.     Cervical back: Normal range of motion and neck supple.  Lymphadenopathy:     Cervical: No cervical adenopathy.  Skin:    General: Skin is warm and dry.  Neurological:     Mental Status: He is alert and oriented to person, place, and time.  Psychiatric:        Behavior: Behavior normal.        Thought Content: Thought content normal.        Judgment: Judgment normal.    BP 125/84   Pulse 82   Temp 97.7 F (36.5 C) (Temporal)   Resp 20   Ht 5' 8 (1.727 m)   Wt 188 lb (85.3 kg)   SpO2 95%   BMI 28.59 kg/m          Assessment & Plan:  Carl Mccullough  comes in today with chief complaint of Medical Management of Chronic Issues   Diagnosis and orders addressed:  1. Primary hypertension (Primary) Low sodium diet - lisinopril -hydrochlorothiazide  (ZESTORETIC ) 10-12.5 MG tablet; Take 1 tablet by mouth daily.  Dispense: 90 tablet; Refill: 1 - CBC with Differential/Platelet - CMP14+EGFR  2. Mixed hyperlipidemia Low fta diet - Lipid panel  3. Diverticulosis of colon without hemorrhage  4. Benign prostatic hyperplasia without lower urinary tract symptoms Report any voiding issues - PSA, total and free  5. Erectile dysfunction due to arterial insufficiency - tadalafil  (CIALIS ) 20 MG tablet; Take 0.5-1 tablets (10-20 mg total) by mouth every other day as needed for erectile dysfunction.  Dispense: 5 tablet; Refill: 11  6. Testosterone  deficiency Last labs were normal  7. Vitamin D  deficiency Continue vitamin d  supplement  8. BMI 28.0-28.9,adult Discussed diet and exercise for person with BMI >25 Will recheck weight in 3-6 months   9. Oropharyngeal dysphagia - Ambulatory referral to Gastroenterology   Labs pending Health Maintenance reviewed Diet and exercise encouraged  Follow up plan: 6 months   Mary-Margaret Gladis, FNP

## 2023-02-26 NOTE — Patient Instructions (Signed)
 Dysphagia    Dysphagia is trouble swallowing. This condition occurs when solids and liquids stick in a person's throat on the way down to the stomach, or when food takes longer to get to the stomach than usual.  You may have problems swallowing food, liquids, or both. You may also have pain while trying to swallow. It may take you more time and effort to swallow something.  What are the causes?  This condition may be caused by:  Muscle problems. These may make it difficult for you to move food and liquids through the esophagus, which is the tube that connects your mouth to your stomach.  Blockages. You may have ulcers, scar tissue, or inflammation that blocks the normal passage of food and liquids. Causes of these problems include:  Acid reflux from your stomach into your esophagus (gastroesophageal reflux).  Infections.  Radiation treatment for cancer.  Medicines taken without enough fluids to wash them down into your stomach.  Stroke. This can affect the nerves and make it difficult to swallow.  Nerve problems. These prevent signals from being sent to the muscles of your esophagus to squeeze (contract) and move what you swallow down to your stomach.  Globus pharyngeus. This is a common problem that involves a feeling like something is stuck in your throat or a sense of trouble with swallowing, even though nothing is wrong with the swallowing passages.  Certain conditions, such as cerebral palsy or Parkinson's disease.  What are the signs or symptoms?  Common symptoms of this condition include:  A feeling that solids or liquids are stuck in your throat on the way down to the stomach.  Pain while swallowing.  Coughing or gagging while trying to swallow.  Other symptoms include:  Food moving back from your stomach to your mouth (regurgitation).  Noises coming from your throat.  Chest discomfort when swallowing.  A feeling of fullness when swallowing.  Drooling, especially when the throat is blocked.  Heartburn.  How  is this diagnosed?  This condition may be diagnosed by:  Barium swallow X-ray. In this test, you will swallow a white liquid that sticks to the inside of your esophagus. X-ray images are then taken.  Endoscopy. In this test, a flexible telescope is inserted down your throat to look at your esophagus and your stomach.  CT scans or an MRI.  How is this treated?  Treatment for dysphagia depends on the cause of this condition:  If the dysphagia is caused by acid reflux or infection, medicines may be used. These may include antibiotics or heartburn medicines.  If the dysphagia is caused by problems with the muscles, swallowing therapy may be used to help you strengthen your swallowing muscles. You may have to do specific exercises to strengthen the muscles or stretch them.  If the dysphagia is caused by a blockage or mass, procedures to remove the blockage may be done. You may need surgery and a feeding tube.  You may need to make diet changes. Ask your health care provider for specific instructions.  Follow these instructions at home:  Medicines  Take over-the-counter and prescription medicines only as told by your health care provider.  If you were prescribed an antibiotic medicine, take it as told by your health care provider. Do not stop taking the antibiotic even if you start to feel better.  Eating and drinking    Make any diet changes as told by your health care provider.  Work with a diet and nutrition specialist (  dietitian) to create an eating plan that will help you get the nutrients you need in order to stay healthy.  Eat soft foods that are easier to swallow.  Cut your food into small pieces and eat slowly. Take small bites.  Eat and drink only when you are sitting upright.  Do not drink alcohol or caffeine. If you need help quitting, ask your health care provider.  General instructions  Check your weight every day to make sure you are not losing weight.  Do not use any products that contain nicotine or  tobacco. These products include cigarettes, chewing tobacco, and vaping devices, such as e-cigarettes. If you need help quitting, ask your health care provider.  Keep all follow-up visits. This is important.  Contact a health care provider if:  You lose weight because you cannot swallow.  You cough when you drink liquids.  You cough up partially digested food.  Get help right away if:  You cannot swallow your saliva.  You have shortness of breath, a fever, or both.  Your voice is hoarse and you have trouble swallowing.  These symptoms may represent a serious problem that is an emergency. Do not wait to see if the symptoms will go away. Get medical help right away. Call your local emergency services (911 in the [place] Do not drive yourself to the hospital.  Summary  Dysphagia is trouble swallowing. This condition occurs when solids and liquids stick in a person's throat on the way down to the stomach. You may cough or gag while trying to swallow.  Dysphagia has many possible causes.  Treatment for dysphagia depends on the cause of the condition.  Keep all follow-up visits. This is important.  This information is not intended to replace advice given to you by your health care provider. Make sure you discuss any questions you have with your health care provider.  Document Revised: 09/24/2019 Document Reviewed: 09/24/2019  Elsevier Patient Education  2024 [employer].

## 2023-02-27 LAB — CBC WITH DIFFERENTIAL/PLATELET
Basophils Absolute: 0.1 10*3/uL (ref 0.0–0.2)
Basos: 1 %
EOS (ABSOLUTE): 0.2 10*3/uL (ref 0.0–0.4)
Eos: 3 %
Hematocrit: 46.2 % (ref 37.5–51.0)
Hemoglobin: 16 g/dL (ref 13.0–17.7)
Immature Grans (Abs): 0 10*3/uL (ref 0.0–0.1)
Immature Granulocytes: 0 %
Lymphocytes Absolute: 2.4 10*3/uL (ref 0.7–3.1)
Lymphs: 36 %
MCH: 32.9 pg (ref 26.6–33.0)
MCHC: 34.6 g/dL (ref 31.5–35.7)
MCV: 95 fL (ref 79–97)
Monocytes Absolute: 0.6 10*3/uL (ref 0.1–0.9)
Monocytes: 10 %
Neutrophils Absolute: 3.4 10*3/uL (ref 1.4–7.0)
Neutrophils: 50 %
Platelets: 285 10*3/uL (ref 150–450)
RBC: 4.87 x10E6/uL (ref 4.14–5.80)
RDW: 12.1 % (ref 11.6–15.4)
WBC: 6.7 10*3/uL (ref 3.4–10.8)

## 2023-02-27 LAB — PSA, TOTAL AND FREE
PSA, Free Pct: 39.3 %
PSA, Free: 0.55 ng/mL
Prostate Specific Ag, Serum: 1.4 ng/mL (ref 0.0–4.0)

## 2023-02-27 LAB — CMP14+EGFR
ALT: 28 [IU]/L (ref 0–44)
AST: 24 [IU]/L (ref 0–40)
Albumin: 4.8 g/dL (ref 3.9–4.9)
Alkaline Phosphatase: 105 [IU]/L (ref 44–121)
BUN/Creatinine Ratio: 15 (ref 10–24)
BUN: 15 mg/dL (ref 8–27)
Bilirubin Total: 0.4 mg/dL (ref 0.0–1.2)
CO2: 23 mmol/L (ref 20–29)
Calcium: 9.9 mg/dL (ref 8.6–10.2)
Chloride: 98 mmol/L (ref 96–106)
Creatinine, Ser: 1.03 mg/dL (ref 0.76–1.27)
Globulin, Total: 2.7 g/dL (ref 1.5–4.5)
Glucose: 94 mg/dL (ref 70–99)
Potassium: 4.8 mmol/L (ref 3.5–5.2)
Sodium: 137 mmol/L (ref 134–144)
Total Protein: 7.5 g/dL (ref 6.0–8.5)
eGFR: 79 mL/min/{1.73_m2} (ref >59)

## 2023-02-27 LAB — LIPID PANEL
Chol/HDL Ratio: 2.6 {ratio} (ref 0.0–5.0)
Cholesterol, Total: 170 mg/dL (ref 100–199)
HDL: 66 mg/dL (ref >39)
LDL Chol Calc (NIH): 90 mg/dL (ref 0–99)
Triglycerides: 75 mg/dL (ref 0–149)
VLDL Cholesterol Cal: 14 mg/dL (ref 5–40)

## 2023-03-25 DIAGNOSIS — Q846 Other congenital malformations of nails: Secondary | ICD-10-CM | POA: Diagnosis not present

## 2023-05-12 DIAGNOSIS — L608 Other nail disorders: Secondary | ICD-10-CM | POA: Diagnosis not present

## 2023-05-12 DIAGNOSIS — Z538 Procedure and treatment not carried out for other reasons: Secondary | ICD-10-CM | POA: Diagnosis not present

## 2023-05-14 DIAGNOSIS — L918 Other hypertrophic disorders of the skin: Secondary | ICD-10-CM | POA: Diagnosis not present

## 2023-05-14 DIAGNOSIS — L570 Actinic keratosis: Secondary | ICD-10-CM | POA: Diagnosis not present

## 2023-05-14 DIAGNOSIS — L821 Other seborrheic keratosis: Secondary | ICD-10-CM | POA: Diagnosis not present

## 2023-05-14 DIAGNOSIS — L814 Other melanin hyperpigmentation: Secondary | ICD-10-CM | POA: Diagnosis not present

## 2023-05-14 DIAGNOSIS — L820 Inflamed seborrheic keratosis: Secondary | ICD-10-CM | POA: Diagnosis not present

## 2023-05-14 DIAGNOSIS — D2372 Other benign neoplasm of skin of left lower limb, including hip: Secondary | ICD-10-CM | POA: Diagnosis not present

## 2023-07-06 ENCOUNTER — Other Ambulatory Visit: Payer: Self-pay | Admitting: Nurse Practitioner

## 2023-07-06 DIAGNOSIS — E559 Vitamin D deficiency, unspecified: Secondary | ICD-10-CM

## 2023-08-20 ENCOUNTER — Encounter: Payer: Self-pay | Admitting: Nurse Practitioner

## 2023-08-20 ENCOUNTER — Ambulatory Visit: Payer: Medicare Other | Admitting: Nurse Practitioner

## 2023-08-20 VITALS — BP 120/83 | HR 70 | Temp 98.1°F | Ht 68.0 in | Wt 194.0 lb

## 2023-08-20 DIAGNOSIS — Z6829 Body mass index (BMI) 29.0-29.9, adult: Secondary | ICD-10-CM

## 2023-08-20 DIAGNOSIS — E349 Endocrine disorder, unspecified: Secondary | ICD-10-CM | POA: Diagnosis not present

## 2023-08-20 DIAGNOSIS — E782 Mixed hyperlipidemia: Secondary | ICD-10-CM | POA: Diagnosis not present

## 2023-08-20 DIAGNOSIS — I10 Essential (primary) hypertension: Secondary | ICD-10-CM | POA: Diagnosis not present

## 2023-08-20 DIAGNOSIS — B001 Herpesviral vesicular dermatitis: Secondary | ICD-10-CM

## 2023-08-20 DIAGNOSIS — E559 Vitamin D deficiency, unspecified: Secondary | ICD-10-CM

## 2023-08-20 DIAGNOSIS — N5201 Erectile dysfunction due to arterial insufficiency: Secondary | ICD-10-CM

## 2023-08-20 DIAGNOSIS — K5730 Diverticulosis of large intestine without perforation or abscess without bleeding: Secondary | ICD-10-CM | POA: Diagnosis not present

## 2023-08-20 LAB — LIPID PANEL

## 2023-08-20 MED ORDER — TADALAFIL 20 MG PO TABS: 10.0000 mg | ORAL_TABLET | ORAL | 11 refills | Status: DC | PRN

## 2023-08-20 MED ORDER — VALACYCLOVIR HCL 1 G PO TABS: ORAL_TABLET | ORAL | 0 refills | Status: AC

## 2023-08-20 MED ORDER — VITAMIN D (ERGOCALCIFEROL) 1.25 MG (50000 UNIT) PO CAPS: 50000.0000 [IU] | ORAL_CAPSULE | ORAL | 1 refills | Status: DC

## 2023-08-20 MED ORDER — LISINOPRIL-HYDROCHLOROTHIAZIDE 10-12.5 MG PO TABS: 1.0000 | ORAL_TABLET | Freq: Every day | ORAL | 1 refills | Status: DC

## 2023-08-20 NOTE — Progress Notes (Signed)
 Subjective:    Patient ID: Carl Mccullough, male    DOB: 10/15/1955, 68 y.o.   MRN: [id number]   Chief Complaint: medical management of chronic issues     HPI:  Carl Mccullough is a 68 y.o. who identifies as a male who was assigned male at birth.   Social history: Lives with: wife Work history: retired last October   Comes in today for follow up of the following chronic medical issues:  1. Primary hypertension No c/o chest pain, sob or headache. Does not check blood pressure at home. BP Readings from Last 3 Encounters:  02/26/23 125/84  08/19/22 133/85  04/25/22 127/79     2. Mixed hyperlipidemia Does watch diet and stays very active. No dedicated exercise Lab Results  Component Value Date   CHOL 170 02/26/2023   HDL 66 02/26/2023   LDLCALC 90 02/26/2023   TRIG 75 02/26/2023   CHOLHDL 2.6 02/26/2023   The 10-year ASCVD risk score (Arnett DK, et al., 2019) is: 21.6%    3. Diverticulosis of colon without hemorrhage Had 2 flare ups in May. Took flagyl  and augmentin  which worked well for him. No flare ups since then.  4. Testosterone  deficiency Is on no testosterone  replacement Lab Results  Component Value Date   TESTOSTERONE  379 08/19/2022    5. Vitamin D  deficiency No vitamind replacement Last vitamin D  Lab Results  Component Value Date   VD25OH 58.1 08/19/2022     6. Benign prostatic hyperplasia without lower urinary tract symptoms No voiding issues Lab Results  Component Value Date   PSA1 1.4 02/26/2023   PSA1 1.3 02/18/2022   PSA1 1.0 02/08/2021   PSA 0.9 09/29/2013      7. Erectile dysfunction Does not take anything currently  8. BMI 28.0-28.9,adult Weight is up 6lbs Wt Readings from Last 3 Encounters:  08/20/23 194 lb (88 kg)  02/26/23 188 lb (85.3 kg)  08/19/22 193 lb (87.5 kg)   BMI Readings from Last 3 Encounters:  08/20/23 29.50 kg/m  02/26/23 28.59 kg/m  08/19/22 29.35 kg/m         New complaints: None  today  No Known Allergies Outpatient Encounter Medications as of 08/20/2023  Medication Sig   Ascorbic Acid (VITAMIN C) 1000 MG tablet Take 1,000 mg by mouth daily.   cetirizine  (ZYRTEC ) 10 MG tablet Take 1 tablet (10 mg total) by mouth daily.   fluticasone  (FLONASE ) 50 MCG/ACT nasal spray Place 2 sprays into both nostrils daily.   lisinopril -hydrochlorothiazide  (ZESTORETIC ) 10-12.5 MG tablet Take 1 tablet by mouth daily.   meloxicam  (MOBIC ) 15 MG tablet TAKE 1 TABLET (15 MG TOTAL) BY MOUTH DAILY.   tadalafil  (CIALIS ) 20 MG tablet Take 0.5-1 tablets (10-20 mg total) by mouth every other day as needed for erectile dysfunction.   valACYclovir  (VALTREX ) 1000 MG tablet 2 po bid for 1 day at fever blister onset   Vitamin D , Ergocalciferol , (DRISDOL ) 1.25 MG (50000 UNIT) CAPS capsule TAKE 1 CAPSULE (50,000 UNITS TOTAL) BY MOUTH EVERY 7 (SEVEN) DAYS   Facility-Administered Encounter Medications as of 08/20/2023  Medication   methylPREDNISolone  acetate (DEPO-MEDROL ) injection 80 mg    Past Surgical History:  Procedure Laterality Date   CYSTOSCOPY  [id number]   INGUINAL HERNIA REPAIR N/A 06/02/2019   Procedure: LAPAROSCOPIC BILATERAL INGUINAL HERNIA REPAIR, PRIMARY UMBILICAL HERNIA REPAIR;  Surgeon: Carl Standing, MD;  Location: [facility];  Service: General;  Laterality: N/A;  GENERAL WITH ERAS   neck stitches  yrs  ago   cut neck with chainsaw   TONSILLECTOMY      Family History  Problem Relation Age of Onset   Hypertension Mother    Alzheimer's disease Mother    Chronic Renal Failure Mother    Colon cancer Father    Bladder Cancer Father    Hypertension Brother    Rectal cancer Neg Hx    Stomach cancer Neg Hx    Esophageal cancer Neg Hx    Colon polyps Neg Hx       Controlled substance contract: n/a      Review of Systems  Constitutional:  Negative for diaphoresis.  Eyes:  Negative for pain.  Respiratory:  Negative for shortness of breath.   Cardiovascular:   Negative for chest pain, palpitations and leg swelling.  Gastrointestinal:  Negative for abdominal pain.  Endocrine: Negative for polydipsia.  Skin:  Negative for rash.  Neurological:  Negative for dizziness, weakness and headaches.  Hematological:  Does not bruise/bleed easily.  All other systems reviewed and are negative.      Objective:   Physical Exam Vitals and nursing note reviewed.  Constitutional:      Appearance: Normal appearance. He is well-developed.  HENT:     Head: Normocephalic.     Nose: Nose normal.     Mouth/Throat:     Mouth: Mucous membranes are moist.     Pharynx: Oropharynx is clear.  Eyes:     Pupils: Pupils are equal, round, and reactive to light.  Neck:     Thyroid : No thyroid  mass or thyromegaly.     Vascular: No carotid bruit or JVD.     Trachea: Phonation normal.  Cardiovascular:     Rate and Rhythm: Normal rate and regular rhythm.  Pulmonary:     Effort: Pulmonary effort is normal. No respiratory distress.     Breath sounds: Normal breath sounds.  Abdominal:     General: Bowel sounds are normal.     Palpations: Abdomen is soft.     Tenderness: There is no abdominal tenderness.  Musculoskeletal:        General: Normal range of motion.     Cervical back: Normal range of motion and neck supple.  Lymphadenopathy:     Cervical: No cervical adenopathy.  Skin:    General: Skin is warm and dry.  Neurological:     Mental Status: He is alert and oriented to person, place, and time.  Psychiatric:        Behavior: Behavior normal.        Thought Content: Thought content normal.        Judgment: Judgment normal.    BP 120/83   Pulse 70   Temp 98.1 F (36.7 C) (Temporal)   Ht 5' 8 (1.727 m)   Wt 194 lb (88 kg)   SpO2 97%   BMI 29.50 kg/m           Assessment & Plan:  Carl Mccullough comes in today with chief complaint of No chief complaint on file.   Diagnosis and orders addressed:  1. Primary hypertension (Primary) Low sodium  diet - lisinopril -hydrochlorothiazide  (ZESTORETIC ) 10-12.5 MG tablet; Take 1 tablet by mouth daily.  Dispense: 90 tablet; Refill: 1 - CBC with Differential/Platelet - CMP14+EGFR  2. Mixed hyperlipidemia Low fta diet - Lipid panel  3. Diverticulosis of colon without hemorrhage  4. Benign prostatic hyperplasia without lower urinary tract symptoms Report any voiding issues - PSA, total and free  5. Erectile  dysfunction due to arterial insufficiency - tadalafil  (CIALIS ) 20 MG tablet; Take 0.5-1 tablets (10-20 mg total) by mouth every other day as needed for erectile dysfunction.  Dispense: 5 tablet; Refill: 11  6. Testosterone  deficiency Last labs were normal  7. Vitamin D  deficiency Continue vitamin d  supplement  8. BMI 28.0-28.9,adult Discussed diet and exercise for person with BMI >25 Will recheck weight in 3-6 months   Labs pending Health Maintenance reviewed Diet and exercise encouraged  Follow up plan: 6 months   Mary-Margaret Gladis, FNP

## 2023-08-20 NOTE — Patient Instructions (Signed)
 Exercising to Stay Healthy To become healthy and stay healthy, it is recommended that you do moderate-intensity and vigorous-intensity exercise. You can tell that you are exercising at a moderate intensity if your heart starts beating faster and you start breathing faster but can still hold a conversation. You can tell that you are exercising at a vigorous intensity if you are breathing much harder and faster and cannot hold a conversation while exercising. How can exercise benefit me? Exercising regularly is important. It has many health benefits, such as: Improving overall fitness, flexibility, and endurance. Increasing bone density. Helping with weight control. Decreasing body fat. Increasing muscle strength and endurance. Reducing stress and tension, anxiety, depression, or anger. Improving overall health. What guidelines should I follow while exercising? Before you start a new exercise program, talk with your health care provider. Do not exercise so much that you hurt yourself, feel dizzy, or get very short of breath. Wear comfortable clothes and wear shoes with good support. Drink plenty of water while you exercise to prevent dehydration or heat stroke. Work out until your breathing and your heartbeat get faster (moderate intensity). How often should I exercise? Choose an activity that you enjoy, and set realistic goals. Your health care provider can help you make an activity plan that is individually designed and works best for you. Exercise regularly as told by your health care provider. This may include: Doing strength training two times a week, such as: Lifting weights. Using resistance bands. Push-ups. Sit-ups. Yoga. Doing a certain intensity of exercise for a given amount of time. Choose from these options: A total of 150 minutes of moderate-intensity exercise every week. A total of 75 minutes of vigorous-intensity exercise every week. A mix of moderate-intensity and  vigorous-intensity exercise every week. Children, pregnant women, people who have not exercised regularly, people who are overweight, and older adults may need to talk with a health care provider about what activities are safe to perform. If you have a medical condition, be sure to talk with your health care provider before you start a new exercise program. What are some exercise ideas? Moderate-intensity exercise ideas include: Walking 1 mile (1.6 km) in about 15 minutes. Biking. Hiking. Golfing. Dancing. Water aerobics. Vigorous-intensity exercise ideas include: Walking 4.5 miles (7.2 km) or more in about 1 hour. Jogging or running 5 miles (8 km) in about 1 hour. Biking 10 miles (16.1 km) or more in about 1 hour. Lap swimming. Roller-skating or in-line skating. Cross-country skiing. Vigorous competitive sports, such as football, basketball, and soccer. Jumping rope. Aerobic dancing. What are some everyday activities that can help me get exercise? Yard work, such as: Child psychotherapist. Raking and bagging leaves. Washing your car. Pushing a stroller. Shoveling snow. Gardening. Washing windows or floors. How can I be more active in my day-to-day activities? Use stairs instead of an elevator. Take a walk during your lunch break. If you drive, park your car farther away from your work or school. If you take public transportation, get off one stop early and walk the rest of the way. Stand up or walk around during all of your indoor phone calls. Get up, stretch, and walk around every 30 minutes throughout the day. Enjoy exercise with a friend. Support to continue exercising will help you keep a regular routine of activity. Where to find more information You can find more information about exercising to stay healthy from: [place] Department of Health and Human Services: ThisPath.fi Centers for Disease Control and Prevention (  CDC): [employer].br Summary Exercising regularly is  important. It will improve your overall fitness, flexibility, and endurance. Regular exercise will also improve your overall health. It can help you control your weight, reduce stress, and improve your bone density. Do not exercise so much that you hurt yourself, feel dizzy, or get very short of breath. Before you start a new exercise program, talk with your health care provider. This information is not intended to replace advice given to you by your health care provider. Make sure you discuss any questions you have with your health care provider. Document Revised: 06/01/2020 Document Reviewed: 06/01/2020 Elsevier Patient Education  2024 [employer].

## 2023-08-21 ENCOUNTER — Ambulatory Visit: Payer: Self-pay | Admitting: Nurse Practitioner

## 2023-08-21 LAB — CBC WITH DIFFERENTIAL/PLATELET
Basophils Absolute: 0 x10E3/uL (ref 0.0–0.2)
Basos: 0 %
EOS (ABSOLUTE): 0.2 x10E3/uL (ref 0.0–0.4)
Eos: 3 %
Hematocrit: 48.4 % (ref 37.5–51.0)
Hemoglobin: 16.3 g/dL (ref 13.0–17.7)
Immature Grans (Abs): 0 x10E3/uL (ref 0.0–0.1)
Immature Granulocytes: 0 %
Lymphocytes Absolute: 2.3 x10E3/uL (ref 0.7–3.1)
Lymphs: 34 %
MCH: 33.4 pg — ABNORMAL HIGH (ref 26.6–33.0)
MCHC: 33.7 g/dL (ref 31.5–35.7)
MCV: 99 fL — ABNORMAL HIGH (ref 79–97)
Monocytes Absolute: 0.6 x10E3/uL (ref 0.1–0.9)
Monocytes: 9 %
Neutrophils Absolute: 3.6 x10E3/uL (ref 1.4–7.0)
Neutrophils: 54 %
Platelets: 284 x10E3/uL (ref 150–450)
RBC: 4.88 x10E6/uL (ref 4.14–5.80)
RDW: 12.5 % (ref 11.6–15.4)
WBC: 6.7 x10E3/uL (ref 3.4–10.8)

## 2023-08-21 LAB — CMP14+EGFR
ALT: 25 IU/L (ref 0–44)
AST: 24 IU/L (ref 0–40)
Albumin: 4.6 g/dL (ref 3.9–4.9)
Alkaline Phosphatase: 93 IU/L (ref 44–121)
BUN/Creatinine Ratio: 12 (ref 10–24)
BUN: 14 mg/dL (ref 8–27)
Bilirubin Total: 0.4 mg/dL (ref 0.0–1.2)
CO2: 23 mmol/L (ref 20–29)
Calcium: 10 mg/dL (ref 8.6–10.2)
Chloride: 98 mmol/L (ref 96–106)
Creatinine, Ser: 1.17 mg/dL (ref 0.76–1.27)
Globulin, Total: 2.8 g/dL (ref 1.5–4.5)
Glucose: 91 mg/dL (ref 70–99)
Potassium: 5.8 mmol/L — ABNORMAL HIGH (ref 3.5–5.2)
Sodium: 136 mmol/L (ref 134–144)
Total Protein: 7.4 g/dL (ref 6.0–8.5)
eGFR: 67 mL/min/1.73 (ref >59)

## 2023-08-21 LAB — LIPID PANEL
Chol/HDL Ratio: 2.9 ratio (ref 0.0–5.0)
Cholesterol, Total: 172 mg/dL (ref 100–199)
HDL: 59 mg/dL (ref >39)
LDL Chol Calc (NIH): 96 mg/dL (ref 0–99)
Triglycerides: 94 mg/dL (ref 0–149)
VLDL Cholesterol Cal: 17 mg/dL (ref 5–40)

## 2023-09-02 ENCOUNTER — Ambulatory Visit

## 2023-09-02 VITALS — BP 120/83 | HR 70 | Ht 68.0 in | Wt 197.0 lb

## 2023-09-02 DIAGNOSIS — Z0000 Encounter for general adult medical examination without abnormal findings: Secondary | ICD-10-CM | POA: Diagnosis not present

## 2023-09-02 NOTE — Progress Notes (Signed)
 Subjective:   Carl Mccullough is a 68 y.o. who presents for a Medicare Wellness preventive visit.  As a reminder, Annual Wellness Visits don't include a physical exam, and some assessments may be limited, especially if this visit is performed virtually. We may recommend an in-person follow-up visit with your provider if needed.  Visit Complete: Virtual I connected with  Curtistine JAYSON Charles on 09/02/23 by a audio enabled telemedicine application and verified that I am speaking with the correct person using two identifiers.  Patient Location: Home  Provider Location: Home Office  I discussed the limitations of evaluation and management by telemedicine. The patient expressed understanding and agreed to proceed.  Vital Signs: Because this visit was a virtual/telehealth visit, some criteria may be missing or patient reported. Any vitals not documented were not able to be obtained and vitals that have been documented are patient reported.  VideoDeclined- This patient declined Librarian, academic. Therefore the visit was completed with audio only.  Persons Participating in Visit: Patient.  AWV Questionnaire: No: Patient Medicare AWV questionnaire was not completed prior to this visit.  Cardiac Risk Factors include: advanced age (>1men, >60 women);dyslipidemia;hypertension;male gender     Objective:    Today's Vitals   09/02/23 0929  BP: 120/83  Pulse: 70  Weight: 197 lb (89.4 kg)  Height: 5' 8 (1.727 m)   Body mass index is 29.95 kg/m.     09/02/2023    9:16 AM 04/25/2022    9:41 AM 05/26/2020    9:01 AM 06/02/2019    9:40 AM  Advanced Directives  Does Patient Have a Medical Advance Directive? No No No No  Would patient like information on creating a medical advance directive?  No - Patient declined No - Patient declined No - Patient declined    Current Medications (verified) Outpatient Encounter Medications as of 09/02/2023  Medication Sig    lisinopril -hydrochlorothiazide  (ZESTORETIC ) 10-12.5 MG tablet Take 1 tablet by mouth daily.   meloxicam  (MOBIC ) 15 MG tablet TAKE 1 TABLET (15 MG TOTAL) BY MOUTH DAILY.   tadalafil  (CIALIS ) 20 MG tablet Take 0.5-1 tablets (10-20 mg total) by mouth every other day as needed for erectile dysfunction.   valACYclovir  (VALTREX ) 1000 MG tablet 2 po bid for 1 day at fever blister onset   Vitamin D , Ergocalciferol , (DRISDOL ) 1.25 MG (50000 UNIT) CAPS capsule Take 1 capsule (50,000 Units total) by mouth every 7 (seven) days.   cetirizine  (ZYRTEC ) 10 MG tablet Take 1 tablet (10 mg total) by mouth daily. (Patient not taking: Reported on 09/02/2023)   fluticasone  (FLONASE ) 50 MCG/ACT nasal spray Place 2 sprays into both nostrils daily. (Patient not taking: Reported on 09/02/2023)   Facility-Administered Encounter Medications as of 09/02/2023  Medication   methylPREDNISolone  acetate (DEPO-MEDROL ) injection 80 mg    Allergies (verified) Patient has no known allergies.   History: Past Medical History:  Diagnosis Date   Adenomatous colon polyp    Benign prostatic hyperplasia    DDD (degenerative disc disease), lumbosacral    Diverticulitis    History of hypertension off bp meds since 2018 large weight loss   Hyperlipidemia    Inguinal hernia bilateral, non-recurrent    Internal hemorrhoids    Vitamin D  deficiency    Past Surgical History:  Procedure Laterality Date   CYSTOSCOPY  [id number]   INGUINAL HERNIA REPAIR N/A 06/02/2019   Procedure: LAPAROSCOPIC BILATERAL INGUINAL HERNIA REPAIR, PRIMARY UMBILICAL HERNIA REPAIR;  Surgeon: Sheldon Standing, MD;  Location: [facility];  Service: General;  Laterality: N/A;  GENERAL WITH ERAS   neck stitches  yrs ago   cut neck with chainsaw   TONSILLECTOMY     Family History  Problem Relation Age of Onset   Hypertension Mother    Alzheimer's disease Mother    Chronic Renal Failure Mother    Colon cancer Father    Bladder Cancer Father     Hypertension Brother    Rectal cancer Neg Hx    Stomach cancer Neg Hx    Esophageal cancer Neg Hx    Colon polyps Neg Hx    Social History   Socioeconomic History   Marital status: Married    Spouse name: Not on file   Number of children: 1   Years of education: Not on file   Highest education level: Not on file  Occupational History   Occupation: Retired  Tobacco Use   Smoking status: Never   Smokeless tobacco: Never  Vaping Use   Vaping status: Never Used  Substance and Sexual Activity   Alcohol use: Yes    Comment: occ   Drug use: No   Sexual activity: Yes  Other Topics Concern   Not on file  Social History Narrative   Not on file   Social Drivers of Health   Financial Resource Strain: Low Risk  (09/02/2023)   Overall Financial Resource Strain (CARDIA)    Difficulty of Paying Living Expenses: Not hard at all  Food Insecurity: No Food Insecurity (09/02/2023)   Hunger Vital Sign    Worried About Running Out of Food in the Last Year: Never true    Ran Out of Food in the Last Year: Never true  Transportation Needs: No Transportation Needs (09/02/2023)   PRAPARE - Administrator, Civil Service (Medical): No    Lack of Transportation (Non-Medical): No  Physical Activity: Unknown (09/02/2023)   Exercise Vital Sign    Days of Exercise per Week: 7 days    Minutes of Exercise per Session: Not on file  Stress: No Stress Concern Present (09/02/2023)   [employer] of Occupational Health - Occupational Stress Questionnaire    Feeling of Stress: Not at all  Social Connections: Moderately Integrated (09/02/2023)   Social Connection and Isolation Panel    Frequency of Communication with Friends and Family: More than three times a week    Frequency of Social Gatherings with Friends and Family: More than three times a week    Attends Religious Services: More than 4 times per year    Active Member of [employer] or Organizations: No    Attends Hospital doctor: Never    Marital Status: Married    Tobacco Counseling Counseling given: Yes    Clinical Intake:  Pre-visit preparation completed: Yes  Pain : No/denies pain     BMI - recorded: 29.95 Nutritional Status: BMI 25 -29 Overweight Nutritional Risks: None Diabetes: No  No results found for: HGBA1C   How often do you need to have someone help you when you read instructions, pamphlets, or other written materials from your doctor or pharmacy?: 1 - Never  Interpreter Needed?: No  Information entered by :: alia t/cma   Activities of Daily Living     09/02/2023    9:16 AM  In your present state of health, do you have any difficulty performing the following activities:  Hearing? 1  Vision? 0  Difficulty concentrating or making decisions? 0  Walking or climbing  stairs? 0  Dressing or bathing? 0  Doing errands, shopping? 0  Preparing Food and eating ? N  Using the Toilet? N  In the past six months, have you accidently leaked urine? N  Do you have problems with loss of bowel control? N  Managing your Medications? N  Managing your Finances? N  Housekeeping or managing your Housekeeping? N    Patient Care Team: Gladis Mustard, FNP as PCP - General (Family Medicine) Sheldon Standing, MD as Consulting Physician (General Surgery) Pyrtle, Gordy HERO, MD as Consulting Physician (Gastroenterology) Watt Rush, MD as Attending Physician (Urology)  I have updated your Care Teams any recent Medical Services you may have received from other providers in the past year.     Assessment:   This is a routine wellness examination for Alamin.  Hearing/Vision screen Hearing Screening - Comments:: Pt have hearing dif Vision Screening - Comments:: Pt wear glasses denies vision dif/pt goes to [facility] ov 38yrs ago   Goals Addressed             This Visit's Progress    Exercise 150 min/wk Moderate Activity   On track      Depression Screen      09/02/2023    9:18 AM 08/20/2023    9:20 AM 02/26/2023    8:11 AM 08/19/2022    8:19 AM 02/18/2022    8:40 AM 01/22/2022    9:58 AM 12/05/2021    8:40 AM  PHQ 2/9 Scores  PHQ - 2 Score 0 0 0 0 0 0 0  PHQ- 9 Score    0 0 0 0    Fall Risk     09/02/2023    9:13 AM 08/20/2023    9:20 AM 02/26/2023    8:11 AM 08/19/2022    8:19 AM 04/25/2022    9:42 AM  Fall Risk   Falls in the past year? 0 0 0 0 0  Number falls in past yr: 0      Injury with Fall? 0      Risk for fall due to : No Fall Risks      Follow up Falls evaluation completed        MEDICARE RISK AT HOME:  Medicare Risk at Home Any stairs in or around the home?: Yes If so, are there any without handrails?: Yes Home free of loose throw rugs in walkways, pet beds, electrical cords, etc?: Yes Adequate lighting in your home to reduce risk of falls?: Yes Life alert?: No Use of a cane, walker or w/c?: No Grab bars in the bathroom?: Yes Shower chair or bench in shower?: Yes Elevated toilet seat or a handicapped toilet?: Yes  TIMED UP AND GO:  Was the test performed?  no  Cognitive Function: 6CIT completed    04/25/2022    9:42 AM  MMSE - Mini Mental State Exam  Orientation to time 1  Orientation to Place 5  Registration 3  Attention/ Calculation 5  Recall 0  Language- name 2 objects 2  Language- repeat 1  Language- follow 3 step command 3  Language- read & follow direction 1  Write a sentence 1  Copy design 1  Total score 23        09/02/2023    9:18 AM  6CIT Screen  What Year? 0 points  What month? 0 points  What time? 0 points  Count back from 20 0 points  Months in reverse 0 points  Repeat phrase  0 points  Total Score 0 points    Immunizations Immunization History  Administered Date(s) Administered   Fluad Quad(high Dose 65+) 02/07/2020, 01/07/2021, 02/18/2022   Fluad Trivalent(High Dose 65+) 02/26/2023   Influenza, High Dose Seasonal PF 12/28/2018   Influenza,inj,Quad PF,6+ Mos 01/03/2013, 02/21/2014,  12/08/2014, 11/28/2015, 12/19/2016, 12/22/2017   PNEUMOCOCCAL CONJUGATE-20 08/19/2022   Pneumococcal Polysaccharide-23 12/28/2018   Tdap 12/12/2015   Zoster Recombinant(Shingrix) 12/28/2018, 03/16/2019    Screening Tests Health Maintenance  Topic Date Due   COVID-19 Vaccine (1 - 2024-25 season) 09/05/2023 (Originally 10/19/2022)   INFLUENZA VACCINE  09/18/2023   Medicare Annual Wellness (AWV)  09/01/2024   Colonoscopy  07/31/2025   DTaP/Tdap/Td (2 - Td or Tdap) 12/11/2025   Pneumococcal Vaccine: 50+ Years  Completed   Hepatitis C Screening  Completed   Zoster Vaccines- Shingrix  Completed   Hepatitis B Vaccines  Aged Out   HPV VACCINES  Aged Out   Meningococcal B Vaccine  Aged Out    Health Maintenance  There are no preventive care reminders to display for this patient.  Health Maintenance Items Addressed: See Nurse Notes at the end of this note  Additional Screening:  Vision Screening: Recommended annual ophthalmology exams for early detection of glaucoma and other disorders of the eye. Would you like a referral to an eye doctor? No    Dental Screening: Recommended annual dental exams for proper oral hygiene  Community Resource Referral / Chronic Care Management: CRR required this visit?  No   CCM required this visit?  No   Plan:    I have personally reviewed and noted the following in the patient's chart:   Medical and social history Use of alcohol, tobacco or illicit drugs  Current medications and supplements including opioid prescriptions. Patient is not currently taking opioid prescriptions. Functional ability and status Nutritional status Physical activity Advanced directives List of other physicians Hospitalizations, surgeries, and ER visits in previous 12 months Vitals Screenings to include cognitive, depression, and falls Referrals and appointments  In addition, I have reviewed and discussed with patient certain preventive protocols, quality  metrics, and best practice recommendations. A written personalized care plan for preventive services as well as general preventive health recommendations were provided to patient.   Ozie Ned, CMA   09/02/2023   After Visit Summary: (MyChart) Due to this being a telephonic visit, the after visit summary with patients personalized plan was offered to patient via MyChart   Notes: Nothing significant to report at this time.

## 2023-09-02 NOTE — Patient Instructions (Signed)
 Carl Mccullough , Thank you for taking time out of your busy schedule to complete your Annual Wellness Visit with me. I enjoyed our conversation and look forward to speaking with you again next year. I, as well as your care team,  appreciate your ongoing commitment to your health goals. Please review the following plan we discussed and let me know if I can assist you in the future. Your Game plan/ To Do List    Follow up Visits: Next Medicare AWV with our clinical staff: 09/02/24 at 8:40a.m.   Next Office Visit with your provider: 03/14/24 at 9:30a.m.  Clinician Recommendations:  Aim for 30 minutes of exercise or brisk walking, 6-8 glasses of water, and 5 servings of fruits and vegetables each day.       This is a list of the screening recommended for you and due dates:  Health Maintenance  Topic Date Due   Medicare Annual Wellness Visit  04/25/2023   COVID-19 Vaccine (1 - 2024-25 season) 09/05/2023*   Flu Shot  09/18/2023   Colon Cancer Screening  07/31/2025   DTaP/Tdap/Td vaccine (2 - Td or Tdap) 12/11/2025   Pneumococcal Vaccine for age over 92  Completed   Hepatitis C Screening  Completed   Zoster (Shingles) Vaccine  Completed   Hepatitis B Vaccine  Aged Out   HPV Vaccine  Aged Out   Meningitis B Vaccine  Aged Out  *Topic was postponed. The date shown is not the original due date.    Advanced directives: (Declined) Advance directive discussed with you today. Even though you declined this today, please call our office should you change your mind, and we can give you the proper paperwork for you to fill out. Advance Care Planning is important because it:  [x]  Makes sure you receive the medical care that is consistent with your values, goals, and preferences  [x]  It provides guidance to your family and loved ones and reduces their decisional burden about whether or not they are making the right decisions based on your wishes.  Follow the link provided in your after visit summary or read  over the paperwork we have mailed to you to help you started getting your Advance Directives in place. If you need assistance in completing these, please reach out to us  so that we can help you!  See attachments for Preventive Care and Fall Prevention Tips.

## 2023-10-06 ENCOUNTER — Ambulatory Visit: Payer: Self-pay

## 2023-10-06 ENCOUNTER — Ambulatory Visit: Admitting: Family Medicine

## 2023-10-06 ENCOUNTER — Encounter: Payer: Self-pay | Admitting: Family Medicine

## 2023-10-06 VITALS — BP 127/81 | HR 88 | Temp 97.2°F | Ht 68.0 in | Wt 192.8 lb

## 2023-10-06 DIAGNOSIS — L237 Allergic contact dermatitis due to plants, except food: Secondary | ICD-10-CM

## 2023-10-06 MED ORDER — METHYLPREDNISOLONE ACETATE 40 MG/ML IJ SUSP
40.0000 mg | Freq: Once | INTRAMUSCULAR | Status: AC
  Administered 2023-10-06: 60 mg via INTRAMUSCULAR

## 2023-10-06 MED ORDER — PREDNISONE 20 MG PO TABS: 40.0000 mg | ORAL_TABLET | Freq: Every day | ORAL | 0 refills | Status: AC

## 2023-10-06 NOTE — Telephone Encounter (Signed)
 Appointment scheduled.

## 2023-10-06 NOTE — Progress Notes (Signed)
 Subjective:  Patient ID: Carl Mccullough, male    DOB: May 04, 1955, 68 y.o.   MRN: [id number]  Patient Care Team: Gladis Mustard, FNP as PCP - General (Family Medicine) Sheldon Standing, MD as Consulting Physician (General Surgery) Pyrtle, Gordy HERO, MD as Consulting Physician (Gastroenterology) Watt Rush, MD as Attending Physician (Urology)   Chief Complaint:  Poison Oak (Bilateral feet and arms x 1 day)   HPI: Carl Mccullough is a 68 y.o. male presenting on 10/06/2023 for Poison Oak (Bilateral feet and arms x 1 day)  Carl Mccullough is a 68 year old male who presents with a rash on his feet, legs, and hands.  The rash appeared yesterday on both feet, extending slightly up his legs, and is also present on his hands. He describes the rash as feeling 'here and there' and notes a bump near his mouth. He has not used any treatments for the rash yet but typically receives a steroid shot for similar occurrences.  He suspects the rash may be related to exposure to oils while [place] a creek on Saturday, where his feet were in the water for most of the time. He was wearing Crocs and shorts during the exposure. He hopes he has washed off thoroughly since then.  He has previously found relief from a steroid shot, which usually resolves the rash. He has tried using a banana peel on his feet based on internet advice, but it did not seem effective.  No involvement of his eyes or mouth, although he feels a bump near his mouth.          Relevant past medical, surgical, family, and social history reviewed and updated as indicated.  Allergies and medications reviewed and updated. Data reviewed: Chart in Epic.   Past Medical History:  Diagnosis Date   Adenomatous colon polyp    Benign prostatic hyperplasia    DDD (degenerative disc disease), lumbosacral    Diverticulitis    History of hypertension off bp meds since 2018 large weight loss   Hyperlipidemia    Inguinal hernia bilateral,  non-recurrent    Internal hemorrhoids    Vitamin D  deficiency     Past Surgical History:  Procedure Laterality Date   CYSTOSCOPY  [id number]   INGUINAL HERNIA REPAIR N/A 06/02/2019   Procedure: LAPAROSCOPIC BILATERAL INGUINAL HERNIA REPAIR, PRIMARY UMBILICAL HERNIA REPAIR;  Surgeon: Sheldon Standing, MD;  Location: [facility];  Service: General;  Laterality: N/A;  GENERAL WITH ERAS   neck stitches  yrs ago   cut neck with chainsaw   TONSILLECTOMY      Social History   Socioeconomic History   Marital status: Married    Spouse name: Not on file   Number of children: 1   Years of education: Not on file   Highest education level: Not on file  Occupational History   Occupation: Retired  Tobacco Use   Smoking status: Never   Smokeless tobacco: Never  Vaping Use   Vaping status: Never Used  Substance and Sexual Activity   Alcohol use: Yes    Comment: occ   Drug use: No   Sexual activity: Yes  Other Topics Concern   Not on file  Social History Narrative   Not on file   Social Drivers of Health   Financial Resource Strain: Low Risk  (09/02/2023)   Overall Financial Resource Strain (CARDIA)    Difficulty of Paying Living Expenses: Not hard at all  Food Insecurity: No  Food Insecurity (09/02/2023)   Hunger Vital Sign    Worried About Running Out of Food in the Last Year: Never true    Ran Out of Food in the Last Year: Never true  Transportation Needs: No Transportation Needs (09/02/2023)   PRAPARE - Administrator, Civil Service (Medical): No    Lack of Transportation (Non-Medical): No  Physical Activity: Unknown (09/02/2023)   Exercise Vital Sign    Days of Exercise per Week: 7 days    Minutes of Exercise per Session: Not on file  Stress: No Stress Concern Present (09/02/2023)   [employer] of Occupational Health - Occupational Stress Questionnaire    Feeling of Stress: Not at all  Social Connections: Moderately Integrated (09/02/2023)   Social  Connection and Isolation Panel    Frequency of Communication with Friends and Family: More than three times a week    Frequency of Social Gatherings with Friends and Family: More than three times a week    Attends Religious Services: More than 4 times per year    Active Member of [employer] or Organizations: No    Attends Banker Meetings: Never    Marital Status: Married  Catering manager Violence: Not At Risk (09/02/2023)   Humiliation, Afraid, Rape, and Kick questionnaire    Fear of Current or Ex-Partner: No    Emotionally Abused: No    Physically Abused: No    Sexually Abused: No    Outpatient Encounter Medications as of 10/06/2023  Medication Sig   cetirizine  (ZYRTEC ) 10 MG tablet Take 1 tablet (10 mg total) by mouth daily.   fluticasone  (FLONASE ) 50 MCG/ACT nasal spray Place 2 sprays into both nostrils daily.   lisinopril -hydrochlorothiazide  (ZESTORETIC ) 10-12.5 MG tablet Take 1 tablet by mouth daily.   meloxicam  (MOBIC ) 15 MG tablet TAKE 1 TABLET (15 MG TOTAL) BY MOUTH DAILY.   predniSONE  (DELTASONE ) 20 MG tablet Take 2 tablets (40 mg total) by mouth daily with breakfast for 5 days.   tadalafil  (CIALIS ) 20 MG tablet Take 0.5-1 tablets (10-20 mg total) by mouth every other day as needed for erectile dysfunction.   valACYclovir  (VALTREX ) 1000 MG tablet 2 po bid for 1 day at fever blister onset   Vitamin D , Ergocalciferol , (DRISDOL ) 1.25 MG (50000 UNIT) CAPS capsule Take 1 capsule (50,000 Units total) by mouth every 7 (seven) days.   Facility-Administered Encounter Medications as of 10/06/2023  Medication   [COMPLETED] methylPREDNISolone  acetate (DEPO-MEDROL ) injection 40 mg   methylPREDNISolone  acetate (DEPO-MEDROL ) injection 80 mg    No Known Allergies  Pertinent ROS per HPI, otherwise unremarkable      Objective:  BP 127/81   Pulse 88   Temp (!) 97.2 F (36.2 C)   Ht 5' 8 (1.727 m)   Wt 192 lb 12.8 oz (87.5 kg)   SpO2 98%   BMI 29.32 kg/m    Wt Readings  from Last 3 Encounters:  10/06/23 192 lb 12.8 oz (87.5 kg)  09/02/23 197 lb (89.4 kg)  08/20/23 194 lb (88 kg)    Physical Exam Vitals and nursing note reviewed.  Constitutional:      General: He is not in acute distress.    Appearance: Normal appearance. He is not ill-appearing, toxic-appearing or diaphoretic.  HENT:     Head: Normocephalic and atraumatic.     Nose: Nose normal.     Mouth/Throat:     Mouth: Mucous membranes are moist.     Pharynx: Oropharynx is clear.  Eyes:  Conjunctiva/sclera: Conjunctivae normal.     Pupils: Pupils are equal, round, and reactive to light.  Cardiovascular:     Rate and Rhythm: Normal rate and regular rhythm.     Heart sounds: Normal heart sounds.  Pulmonary:     Effort: Pulmonary effort is normal.     Breath sounds: Normal breath sounds.  Musculoskeletal:     Cervical back: Neck supple.     Right lower leg: No edema.     Left lower leg: No edema.  Skin:    General: Skin is warm and dry.     Capillary Refill: Capillary refill takes less than 2 seconds.     Findings: Erythema and rash present. Rash is papular and vesicular.  Neurological:     General: No focal deficit present.     Mental Status: He is alert and oriented to person, place, and time.  Psychiatric:        Mood and Affect: Mood normal.        Behavior: Behavior normal.        Thought Content: Thought content normal.        Judgment: Judgment normal.     Results for orders placed or performed in visit on 08/20/23  CBC with Differential/Platelet   Collection Time: 08/20/23  9:52 AM  Result Value Ref Range   WBC 6.7 3.4 - 10.8 x10E3/uL   RBC 4.88 4.14 - 5.80 x10E6/uL   Hemoglobin 16.3 13.0 - 17.7 g/dL   Hematocrit 51.5 62.4 - 51.0 %   MCV 99 (H) 79 - 97 fL   MCH 33.4 (H) 26.6 - 33.0 pg   MCHC 33.7 31.5 - 35.7 g/dL   RDW 87.4 88.3 - 84.5 %   Platelets 284 150 - 450 x10E3/uL   Neutrophils 54 Not Estab. %   Lymphs 34 Not Estab. %   Monocytes 9 Not Estab. %   Eos  3 Not Estab. %   Basos 0 Not Estab. %   Neutrophils Absolute 3.6 1.4 - 7.0 x10E3/uL   Lymphocytes Absolute 2.3 0.7 - 3.1 x10E3/uL   Monocytes Absolute 0.6 0.1 - 0.9 x10E3/uL   EOS (ABSOLUTE) 0.2 0.0 - 0.4 x10E3/uL   Basophils Absolute 0.0 0.0 - 0.2 x10E3/uL   Immature Granulocytes 0 Not Estab. %   Immature Grans (Abs) 0.0 0.0 - 0.1 x10E3/uL  CMP14+EGFR   Collection Time: 08/20/23  9:52 AM  Result Value Ref Range   Glucose 91 70 - 99 mg/dL   BUN 14 8 - 27 mg/dL   Creatinine, Ser 8.82 0.76 - 1.27 mg/dL   eGFR 67 >40 fO/fpw/8.26   BUN/Creatinine Ratio 12 10 - 24   Sodium 136 134 - 144 mmol/L   Potassium 5.8 (H) 3.5 - 5.2 mmol/L   Chloride 98 96 - 106 mmol/L   CO2 23 20 - 29 mmol/L   Calcium 10.0 8.6 - 10.2 mg/dL   Total Protein 7.4 6.0 - 8.5 g/dL   Albumin 4.6 3.9 - 4.9 g/dL   Globulin, Total 2.8 1.5 - 4.5 g/dL   Bilirubin Total 0.4 0.0 - 1.2 mg/dL   Alkaline Phosphatase 93 44 - 121 IU/L   AST 24 0 - 40 IU/L   ALT 25 0 - 44 IU/L  Lipid panel   Collection Time: 08/20/23  9:52 AM  Result Value Ref Range   Cholesterol, Total 172 100 - 199 mg/dL   Triglycerides 94 0 - 149 mg/dL   HDL 59 >60 mg/dL   VLDL Cholesterol  Cal 17 5 - 40 mg/dL   LDL Chol Calc (NIH) 96 0 - 99 mg/dL   Chol/HDL Ratio 2.9 0.0 - 5.0 ratio       Pertinent labs & imaging results that were available during my care of the patient were reviewed by me and considered in my medical decision making.  Assessment & Plan:  Khameron was seen today for poison oak.  Diagnoses and all orders for this visit:  Poison ivy dermatitis -     predniSONE  (DELTASONE ) 20 MG tablet; Take 2 tablets (40 mg total) by mouth daily with breakfast for 5 days. -     methylPREDNISolone  acetate (DEPO-MEDROL ) injection 40 mg      Allergic contact dermatitis due to plants (poison ivy/oak/sumac) Acute allergic contact dermatitis from poison ivy exposure, affecting feet, legs, hands, and body. Rash onset yesterday. No prior treatment.  Typically responds to steroid injections. No ocular or oral involvement. - Administer steroid injection. - Advise use of over-the-counter diphenhydramine  or calamine lotion for pruritus. - Recommend famotidine and loratadine  daily if pruritus persists, as H1 and H2 antagonists. - Instruct to avoid touching affected areas to prevent spread, especially near eyes. - Advise washing off oils with soap to prevent further spread.          Continue all other maintenance medications.  Follow up plan: Return if symptoms worsen or fail to improve.   Continue healthy lifestyle choices, including diet (rich in fruits, vegetables, and lean proteins, and low in salt and simple carbohydrates) and exercise (at least 30 minutes of moderate physical activity daily).  Educational handout given for poison ivy dermatitis   The above assessment and management plan was discussed with the patient. The patient verbalized understanding of and has agreed to the management plan. Patient is aware to call the clinic if they develop any new symptoms or if symptoms persist or worsen. Patient is aware when to return to the clinic for a follow-up visit. Patient educated on when it is appropriate to go to the emergency department.   Rosaline Bruns, FNP-C [facility] [phone]

## 2023-10-06 NOTE — Telephone Encounter (Signed)
 FYI Only or Action Required?: FYI only for provider.  Patient was last seen in primary care on 08/20/2023 by Gladis Mustard, FNP.  Called Nurse Triage reporting [facility].  Symptoms began several days ago.  Interventions attempted: Nothing.  Symptoms are: unchanged.  Triage Disposition: See Physician Within 24 Hours  Patient/caregiver understands and will follow disposition?: Yes    Copied from CRM #[id number]. Topic: Clinical - Red Word Triage >> Oct 06, 2023  8:05 AM Willma SAUNDERS wrote: [facility] that prompted transfer to Nurse Triage: Patient states he has poison oak on his hands and feet. They are red, itchy, and painful. Reason for Disposition  MODERATE to SEVERE itching (e.g., interferes with work, school, sleep, or other activities)  Answer Assessment - Initial Assessment Questions 1. APPEARANCE of RASH: What does the rash look like?      Like poison oak  2. LOCATION: Where is the rash located?  (e.g., face, genitals, hands, legs)     Both hands and feet 3. SIZE: How large is the rash?      On top of and and top of feet 4. ONSET: When did the rash begin?      Yesterday  5. ITCHING: Does the rash itch? If Yes, ask: How bad is it?     Bad enough to come get a shot 6. EXPOSURE:  How were you exposed to the plant (poison ivy, poison oak, sumac)  When were you exposed?  Note: Sometimes a poison ivy/oak/sumac rash does not appear for 2 to 3 weeks after exposure.      Maybe saturday 7. PAST HISTORY: Have you had a poison ivy rash before? If Yes, ask: How bad was it?     Yes, every year, gets a shot 8. OTHER SYMPTOMS: Do you have any other symptoms? (e.g., fever)      no  Protocols used: Poison Ivy - Oak - Sumac-A-AH

## 2023-11-16 ENCOUNTER — Telehealth: Payer: Self-pay | Admitting: Pharmacist

## 2023-11-16 NOTE — Telephone Encounter (Signed)
   This patient is appearing on a report for being at risk of failing the adherence measure for hypertension (ACEi/ARB) medications this calendar year.   Medication: lisinopril benson Last fill date: 09/28/23 for 90 day supply  1 refill remaining-too early to fill; can revisit   Mliss Tarry Griffin, PharmD, BCACP, CPP Clinical Pharmacist, [facility]

## 2023-12-28 NOTE — Progress Notes (Signed)
 Pharmacy Quality Measure Review  This patient is appearing on a report for being at risk of failing the adherence measure for hypertension (ACEi/ARB) medications this calendar year.   Medication: lisinopril -hydrochlorothiazide  10-12.5 mg Last fill date: 09/28/2023 for 90 day supply  Called [facility] to verify fill records. Patient picked up his prescription on 08/17 and the next fill is scheduled for 11/12. No further action needed at this time. Will follow-up to ensure pick up from pharmacy.   Woodie Jock, PharmD PGY1 Pharmacy Resident  12/28/2023

## 2023-12-30 ENCOUNTER — Telehealth: Payer: Self-pay | Admitting: Internal Medicine

## 2023-12-30 ENCOUNTER — Encounter: Payer: Self-pay | Admitting: Physician Assistant

## 2023-12-30 ENCOUNTER — Ambulatory Visit: Admitting: Physician Assistant

## 2023-12-30 ENCOUNTER — Other Ambulatory Visit (INDEPENDENT_AMBULATORY_CARE_PROVIDER_SITE_OTHER)

## 2023-12-30 DIAGNOSIS — K5732 Diverticulitis of large intestine without perforation or abscess without bleeding: Secondary | ICD-10-CM | POA: Diagnosis not present

## 2023-12-30 DIAGNOSIS — R1032 Left lower quadrant pain: Secondary | ICD-10-CM

## 2023-12-30 LAB — CBC WITH DIFFERENTIAL/PLATELET
Basophils Absolute: 0.1 K/uL (ref 0.0–0.1)
Basophils Relative: 0.8 % (ref 0.0–3.0)
Eosinophils Absolute: 0.1 K/uL (ref 0.0–0.7)
Eosinophils Relative: 1.4 % (ref 0.0–5.0)
HCT: 47.9 % (ref 39.0–52.0)
Hemoglobin: 16.5 g/dL (ref 13.0–17.0)
Lymphocytes Relative: 32.4 % (ref 12.0–46.0)
Lymphs Abs: 2.2 K/uL (ref 0.7–4.0)
MCHC: 34.5 g/dL (ref 30.0–36.0)
MCV: 96.8 fl (ref 78.0–100.0)
Monocytes Absolute: 0.5 K/uL (ref 0.1–1.0)
Monocytes Relative: 7.9 % (ref 3.0–12.0)
Neutro Abs: 3.9 K/uL (ref 1.4–7.7)
Neutrophils Relative %: 57.5 % (ref 43.0–77.0)
Platelets: 271 K/uL (ref 150.0–400.0)
RBC: 4.95 Mil/uL (ref 4.22–5.81)
RDW: 12.9 % (ref 11.5–15.5)
WBC: 6.8 K/uL (ref 4.0–10.5)

## 2023-12-30 LAB — COMPREHENSIVE METABOLIC PANEL WITH GFR
ALT: 32 U/L (ref 0–53)
AST: 23 U/L (ref 0–37)
Albumin: 4.9 g/dL (ref 3.5–5.2)
Alkaline Phosphatase: 79 U/L (ref 39–117)
BUN: 14 mg/dL (ref 6–23)
CO2: 28 meq/L (ref 19–32)
Calcium: 10.3 mg/dL (ref 8.4–10.5)
Chloride: 96 meq/L (ref 96–112)
Creatinine, Ser: 1.02 mg/dL (ref 0.40–1.50)
GFR: 74.61 mL/min (ref >60.00)
Glucose, Bld: 93 mg/dL (ref 70–99)
Potassium: 5.3 meq/L — ABNORMAL HIGH (ref 3.5–5.1)
Sodium: 132 meq/L — ABNORMAL LOW (ref 135–145)
Total Bilirubin: 0.4 mg/dL (ref 0.2–1.2)
Total Protein: 8.3 g/dL (ref 6.0–8.3)

## 2023-12-30 NOTE — Patient Instructions (Signed)
 Your provider has requested that you go to the basement level for lab work before leaving today. Press B on the elevator. The lab is located at the first door on the left as you exit the elevator.  You have been scheduled for a CT scan of the abdomen and pelvis at [facility], 1st floor Radiology. You are scheduled on 01/06/24 at 11:45 am for a 2:00 pm. You should arrive 15 minutes prior to your appointment time for registration. If you have any questions regarding your exam or if you need to reschedule, you may call [facility] at [phone] between the hours of 8:00 am and 5:00 pm, Monday-Friday.   Please follow up sooner if symptoms increase or worsen  Due to recent changes in healthcare laws, you may see the results of your imaging and laboratory studies on MyChart before your provider has had a chance to review them.  We understand that in some cases there may be results that are confusing or concerning to you. Not all laboratory results come back in the same time frame and the provider may be waiting for multiple results in order to interpret others.  Please give us  48 hours in order for your provider to thoroughly review all the results before contacting the office for clarification of your results.   Thank you for trusting me with your gastrointestinal care!   Ellouise Console, PA-C _______________________________________________________  If your blood pressure at your visit was 140/90 or greater, please contact your primary care physician to follow up on this.  _______________________________________________________  If you are age 15 or older, your body mass index should be between 23-30. Your Body mass index is 29.5 kg/m. If this is out of the aforementioned range listed, please consider follow up with your Primary Care Provider.  If you are age 33 or younger, your body mass index should be between 19-25. Your Body mass index is 29.5 kg/m. If this is out of the  aformentioned range listed, please consider follow up with your Primary Care Provider.   ________________________________________________________  The [facility] providers would like to encourage you to use MYCHART to communicate with providers for non-urgent requests or questions.  Due to long hold times on the telephone, sending your provider a message by [facility] may be a faster and more efficient way to get a response.  Please allow 48 business hours for a response.  Please remember that this is for non-urgent requests.  _______________________________________________________

## 2023-12-30 NOTE — Telephone Encounter (Signed)
 Pt reports he was prescribed augmentin  bid for 10 days with 1 refill for diverticulitis. He stated pcp told him to contact our office if he was still having issues. Pt is on day 18 today and still having discomfort in his llq. Pt scheduled to see Ellouise Console PA today at 1:50pm. Pt aware of appt.

## 2023-12-30 NOTE — Progress Notes (Signed)
 Ellouise Console, PA-C [address] [place], [place]  [address] Phone: [phone]   Primary Care Physician: Gladis Mustard, FNP  Primary Gastroenterologist:  Ellouise Console, PA-C / Dr. Gordy Starch   Chief Complaint:  LLQ Pain, Diverticulitis       HPI:   Discussed the use of AI scribe software for clinical note transcription with the patient, who gave verbal consent to proceed. History of Present Illness Carl Mccullough is a 68 year old male with diverticulosis who presents with a flare-up of diverticulitis.  He is experiencing a flare-up of LLQ pain and presumed diverticulitis that began on December 13, 2023. Initially, the pain was severe, prompting him to consider going to the hospital, however he did not go. He contacted his primary care physician, who prescribed Augmentin , which he has been taking since December 13, 2023. The initial prescription was for ten days with a refill, which he utilized due to persistent pain.  He states he has been on Augmentin  antibiotic for 18 days.  Currently, his LLQ pain has improved and he rates his pain as a 3 out of 10, describing it as soreness, particularly when he presses on the area. No constipation or fever during this episode.  No rectal bleeding.  He reports a history of recurrent diverticulitis. His last CT scan April 2022 showed uncomplicated sigmoid diverticulitis with no perforation or abscess, and resolved with antibiotic treatment.  He has taken Cipro , Flagyl , and Augmentin  for episodes of diverticulitis in past years.  He denies any antibiotic allergies.  07/2020 last colonoscopy by Dr. Starch: 3 small (2 mm to 4 mm) polyps removed.  Pathology showed 1 sessile serrated and 2 hyperplastic polyps.  Pandiverticulosis.  Good prep.  5-year repeat colonoscopy was recommended.  11/2018 colonoscopy: 5 small (3 mm to 5 mm) polyps removed.  3-year repeat colonoscopy recommended.  01/2018 colonoscopy: 13 polyps removed (ranging in size 3  mm to 10 mm).  Pathology showed  8 tubular adenoma polyps, 4 sessile serrated polyps, and 1 hyperplastic polyp removed.  1 year repeat colonoscopy was recommended.  02/2012 colonoscopy: 1 small 5 mm rectal polyp removed.  Good prep.  His father had colon cancer.  Current Outpatient Medications  Medication Sig Dispense Refill   amoxicillin -clavulanate (AUGMENTIN ) 250-125 MG tablet Take 1 tablet by mouth 3 (three) times daily.     lisinopril -hydrochlorothiazide  (ZESTORETIC ) 10-12.5 MG tablet Take 1 tablet by mouth daily. 90 tablet 1   meloxicam  (MOBIC ) 15 MG tablet TAKE 1 TABLET (15 MG TOTAL) BY MOUTH DAILY. 90 tablet 1   tadalafil  (CIALIS ) 20 MG tablet Take 0.5-1 tablets (10-20 mg total) by mouth every other day as needed for erectile dysfunction. 5 tablet 11   valACYclovir  (VALTREX ) 1000 MG tablet 2 po bid for 1 day at fever blister onset 20 tablet 0   Vitamin D , Ergocalciferol , (DRISDOL ) 1.25 MG (50000 UNIT) CAPS capsule Take 1 capsule (50,000 Units total) by mouth every 7 (seven) days. 12 capsule 1   cetirizine  (ZYRTEC ) 10 MG tablet Take 1 tablet (10 mg total) by mouth daily. (Patient not taking: Reported on 12/30/2023) 90 tablet 1   fluticasone  (FLONASE ) 50 MCG/ACT nasal spray Place 2 sprays into both nostrils daily. (Patient not taking: Reported on 12/30/2023) 16 g 6   Current Facility-Administered Medications  Medication Dose Route Frequency Provider Last Rate Last Admin   methylPREDNISolone  acetate (DEPO-MEDROL ) injection 80 mg  80 mg Intramuscular Once Gladis Mustard, FNP  Allergies as of 12/30/2023   (No Known Allergies)    Past Medical History:  Diagnosis Date   Adenomatous colon polyp    Benign prostatic hyperplasia    DDD (degenerative disc disease), lumbosacral    Diverticulitis    History of hypertension off bp meds since 2018 large weight loss   Hyperlipidemia    Inguinal hernia bilateral, non-recurrent    Internal hemorrhoids    Vitamin D  deficiency      Past Surgical History:  Procedure Laterality Date   CYSTOSCOPY  [id number]   INGUINAL HERNIA REPAIR N/A 06/02/2019   Procedure: LAPAROSCOPIC BILATERAL INGUINAL HERNIA REPAIR, PRIMARY UMBILICAL HERNIA REPAIR;  Surgeon: Sheldon Standing, MD;  Location: [facility];  Service: General;  Laterality: N/A;  GENERAL WITH ERAS   neck stitches  yrs ago   cut neck with chainsaw   TONSILLECTOMY      Review of Systems:    All systems reviewed and negative except where noted in HPI.    Physical Exam:  BP 136/80   Pulse 98   Ht 5' 8 (1.727 m)   Wt 194 lb (88 kg)   BMI 29.50 kg/m  No LMP for male patient.  General: Well-nourished, well-developed in no acute distress.  Lungs: Clear to auscultation bilaterally. Non-labored. Heart: Regular rate and rhythm, no murmurs rubs or gallops.  Abdomen: Bowel sounds are normal; Abdomen is Soft; No hepatosplenomegaly, masses or hernias; mild LLQ abdominal Tenderness; rest of abdomen is not tender.  No guarding or rebound tenderness. Neuro: Alert and oriented x 3.  Grossly intact.  Psych: Alert and cooperative, normal mood and affect.   Imaging Studies: No results found.  Labs: CBC    Component Value Date/Time   WBC 6.8 12/30/2023 1533   RBC 4.95 12/30/2023 1533   HGB 16.5 12/30/2023 1533   HGB 16.3 08/20/2023 0952   HCT 47.9 12/30/2023 1533   HCT 48.4 08/20/2023 0952   PLT 271.0 12/30/2023 1533   PLT 284 08/20/2023 0952   MCV 96.8 12/30/2023 1533   MCV 99 (H) 08/20/2023 0952   MCH 33.4 (H) 08/20/2023 0952   MCH 32.8 05/26/2020 0910   MCHC 34.5 12/30/2023 1533   RDW 12.9 12/30/2023 1533   RDW 12.5 08/20/2023 0952   LYMPHSABS 2.2 12/30/2023 1533   LYMPHSABS 2.3 08/20/2023 0952   MONOABS 0.5 12/30/2023 1533   EOSABS 0.1 12/30/2023 1533   EOSABS 0.2 08/20/2023 0952   BASOSABS 0.1 12/30/2023 1533   BASOSABS 0.0 08/20/2023 0952    CMP     Component Value Date/Time   NA 132 (L) 12/30/2023 1533   NA 136 08/20/2023 0952    K 5.3 No hemolysis seen (H) 12/30/2023 1533   CL 96 12/30/2023 1533   CO2 28 12/30/2023 1533   GLUCOSE 93 12/30/2023 1533   BUN 14 12/30/2023 1533   BUN 14 08/20/2023 0952   CREATININE 1.02 12/30/2023 1533   CREATININE 0.92 01/03/2013 0832   CALCIUM 10.3 12/30/2023 1533   PROT 8.3 12/30/2023 1533   PROT 7.4 08/20/2023 0952   ALBUMIN 4.9 12/30/2023 1533   ALBUMIN 4.6 08/20/2023 0952   AST 23 12/30/2023 1533   ALT 32 12/30/2023 1533   ALKPHOS 79 12/30/2023 1533   BILITOT 0.4 12/30/2023 1533   BILITOT 0.4 08/20/2023 0952   GFRNONAA >60 05/26/2020 0910   GFRNONAA >89 01/03/2013 0832   GFRAA 100 02/07/2020 1559   GFRAA >89 01/03/2013 0832       Assessment and Plan:  DARYEL KENNETH is a 68 y.o. y/o male presents for recurrent LLQ pain and presumed diverticulitis. Assessment & Plan 1.  Presumed recurrent diverticulitis with LLQ pain.  Pain has decreased after taking Augmentin  for 18 days, yet not resolved.  No fever, rectal bleeding, or constipation.  - Ordered abdominal pelvic CT with contrast. - Ordered labs CBC, CMP - Recommend soft low fiber diet until pain resolves. - If he has persistent diverticulitis on CT, then recommend Cipro  and Flagyl  antibiotics.  Also consider hospitalization for IV antibiotics - Avoid colonoscopy for 6-8 weeks post-resolution to decrease risk of colon perforation..  2.  Personal history of multiple tubular adenoma and sessile serrated colonic polyps with family history of colon cancer (father).  Patient's last colonoscopy 07/2020 showed 1 sessile serrated and 2 hyperplastic polyps removed (2 mm to 4 mm). -Discussed surveillance colonoscopy guidelines at length. - Based on patient's last colonoscopy and family history, 5-year repeat colonoscopy is recommended 07/2025.  I will discuss with Dr. Albertus and make sure he agrees with recommendation. - Colonoscopy recommended at least every 5 years. - I advised patient colonoscopy should be postponed at least  6-8 weeks after diverticulitis resolution, to decrease risk of colon perforation.   Ellouise Console, PA-C  Follow up in 6 weeks with TG or Dr. Albertus.  Also follow-up based on CT and lab results.

## 2023-12-30 NOTE — Telephone Encounter (Signed)
 Inbound call from patient stating he is currently having a severe diverticulitis flare up. States Dr. Georgina prescribed medication that seemed to help but he is still sore on the left side. Also would like to discuss change in colonoscopy recall further. Please advise. Thank you.

## 2023-12-31 ENCOUNTER — Ambulatory Visit: Payer: Self-pay | Admitting: Physician Assistant

## 2023-12-31 DIAGNOSIS — E875 Hyperkalemia: Secondary | ICD-10-CM

## 2023-12-31 NOTE — Progress Notes (Signed)
 Addendum: Reviewed and agree with assessment and management plan. While guidelines support colonoscopy at the 5-year mark, given his history of multiple polyps and family history we can split the difference and repeat colonoscopy at the 4-year mark which would be June 2026 Oscar, please change recall to reflect 4 yr interval and notify patient). Agree with ongoing assessment for possible persistent diverticulitis with CT scan. Jameila Keeny, Gordy HERO, MD

## 2024-01-01 NOTE — Telephone Encounter (Signed)
 Patient requesting to speak with a nurse in regards to previous note. requesting a call.   Please advise.  Thank you

## 2024-01-04 ENCOUNTER — Ambulatory Visit (HOSPITAL_COMMUNITY)
Admission: RE | Admit: 2024-01-04 | Discharge: 2024-01-04 | Disposition: A | Discharge status: Home / Self Care | Source: Ambulatory Visit | Attending: Physician Assistant | Admitting: Physician Assistant

## 2024-01-04 DIAGNOSIS — K5732 Diverticulitis of large intestine without perforation or abscess without bleeding: Secondary | ICD-10-CM | POA: Diagnosis present

## 2024-01-04 DIAGNOSIS — R1032 Left lower quadrant pain: Secondary | ICD-10-CM | POA: Diagnosis present

## 2024-01-04 MED ORDER — IOHEXOL 9 MG/ML PO SOLN
1000.0000 mL | ORAL | Status: AC
  Administered 2024-01-04: 1000 mL via ORAL

## 2024-01-04 MED ORDER — SODIUM CHLORIDE (PF) 0.9 % IJ SOLN
INTRAMUSCULAR | Status: AC
  Filled 2024-01-04: qty 50

## 2024-01-04 MED ORDER — IOHEXOL 9 MG/ML PO SOLN
ORAL | Status: AC
Start: 2024-01-04 — End: 2024-01-04
  Filled 2024-01-04: qty 1000

## 2024-01-04 MED ORDER — IOHEXOL 300 MG/ML  SOLN
100.0000 mL | Freq: Once | INTRAMUSCULAR | Status: AC | PRN
  Administered 2024-01-04: 100 mL via INTRAVENOUS

## 2024-01-06 ENCOUNTER — Ambulatory Visit (HOSPITAL_COMMUNITY)

## 2024-01-11 MED ORDER — METRONIDAZOLE 500 MG PO TABS: 500.0000 mg | ORAL_TABLET | Freq: Two times a day (BID) | ORAL | 0 refills | Status: AC

## 2024-01-11 MED ORDER — CIPROFLOXACIN HCL 500 MG PO TABS: 500.0000 mg | ORAL_TABLET | Freq: Two times a day (BID) | ORAL | 0 refills | Status: AC

## 2024-01-11 NOTE — Progress Notes (Signed)
 Call and notify patient abdominal pelvic CT shows: Persistent acute uncomplicated sigmoid diverticulitis.  There is no abscess or perforation.  Stop Augmentin .  Change antibiotics to ciprofloxacin  500 mg twice daily x 10 days and metronidazole  500 mg twice daily x 10 days, #20, no refills.  If LLQ pain does not resolve on these antibiotics, then he may need to be treated with IV antibiotics in hospital.  Recommend soft diet until LLQ pain resolves. Ellouise Console, PA-C

## 2024-01-12 NOTE — Telephone Encounter (Signed)
 Patient is requesting a call back to discuss previous note further. States he has further questions he would like to discuss. Please advise, thank you

## 2024-01-12 NOTE — Telephone Encounter (Signed)
 I have spoken to patient who requested to discuss cipro  and flagyl  medications provided yesterday. Did pick up the prescription but has not yet started as he wanted to insure that he needed it. He is advised he should begin the medicine. He verbalizes understanding.

## 2024-01-13 NOTE — Telephone Encounter (Signed)
 Patient called requesting a call back. States he has further questions. Please advise, thank you

## 2024-01-20 ENCOUNTER — Other Ambulatory Visit

## 2024-01-20 DIAGNOSIS — E875 Hyperkalemia: Secondary | ICD-10-CM | POA: Diagnosis not present

## 2024-01-20 LAB — POTASSIUM: Potassium: 4.8 meq/L (ref 3.5–5.1)

## 2024-01-21 ENCOUNTER — Telehealth: Payer: Self-pay | Admitting: Physician Assistant

## 2024-01-21 ENCOUNTER — Ambulatory Visit: Payer: Self-pay | Admitting: Physician Assistant

## 2024-01-21 NOTE — Telephone Encounter (Signed)
 Inbound call from patient stating he would like to speak to nurse in regards to his diverticulitis flare up Requesting a call back  Please advise  Thank you

## 2024-01-21 NOTE — Telephone Encounter (Signed)
 Spoke to patient who states that he as 2 additional days left of cipro  and flagyl  for persistent diverticulitis but is still experiencing LLQ abdominal pain. Patient describes pain as tenderness, worse with sitting, a bit better with walking. Says pain is constant. He denies any change in bowels, fever, nausea or vomiting.  Please advise.SABRASABRA

## 2024-01-22 NOTE — Telephone Encounter (Signed)
 Spoke with patient to advise of Ellouise Console, PA-C recommendation to present to the ER for further evaluation of persistent diverticulitis symptoms and possible admission for IV antibiotics. Patient states that he started to feel a little better last night and plans to take the remainder of his antibiotics (today, tomorrow) to see if pain subsides. He agrees to go to the hospital if his symptoms do not subside at the end of his antibiotic course.

## 2024-01-28 ENCOUNTER — Encounter: Payer: Self-pay | Admitting: Nurse Practitioner

## 2024-01-28 ENCOUNTER — Ambulatory Visit: Admitting: Nurse Practitioner

## 2024-01-28 VITALS — BP 132/85 | HR 74 | Temp 98.0°F | Ht 68.0 in | Wt 188.0 lb

## 2024-01-28 DIAGNOSIS — B370 Candidal stomatitis: Secondary | ICD-10-CM | POA: Diagnosis not present

## 2024-01-28 DIAGNOSIS — S161XXA Strain of muscle, fascia and tendon at neck level, initial encounter: Secondary | ICD-10-CM | POA: Diagnosis not present

## 2024-01-28 DIAGNOSIS — R590 Localized enlarged lymph nodes: Secondary | ICD-10-CM

## 2024-01-28 DIAGNOSIS — K5790 Diverticulosis of intestine, part unspecified, without perforation or abscess without bleeding: Secondary | ICD-10-CM

## 2024-01-28 MED ORDER — NYSTATIN 100000 UNIT/ML MT SUSP: 5.0000 mL | Freq: Four times a day (QID) | OROMUCOSAL | 0 refills | Status: DC

## 2024-01-28 NOTE — Patient Instructions (Signed)

## 2024-01-28 NOTE — Progress Notes (Signed)
 Subjective:    Patient ID: Carl Mccullough, male    DOB: [birth year], 68 y.o.   MRN: [id number]   Chief Complaint: neck sore and painful   HPI Patient in c/o: - anterior neck is sore- no injury- has pulling sensation when turning his neck left or right. Says that not painful, just feels tight. -his tongue is sore. He can brush his tongue and gargle with listerine and helps - knot at base of right ear. He just noticed it 2 days ago. No change in size since he found it. Not tender to touch.  - diverticulosis- had flare up several weeks ago- took antibiotics for 16 days. Was still having pain after treatment. Went and saw Dr. Albertus and she sent him for a ct scan. Showed acute diverticulitis and they called and put him back on antibiotics. Finished antiobiotic on Saturday. Still has slight soreness on left side when he lays down. He called Dr. Albertus office earlier this week and they said they would not do any more antibiotics and is continued to go to the ED. Patient Active Problem List   Diagnosis Date Noted   BMI 28.0-28.9,adult 01/07/2021   Bilateral inguinal hernia (BIH) 06/02/2019   History of colonic polyps 06/22/2018   Benign prostatic hyperplasia without lower urinary tract symptoms 12/19/2016   Vitamin D  deficiency 12/19/2016   Family history of bladder cancer 12/19/2016   Diverticulosis of colon without hemorrhage 01/03/2013   Erectile dysfunction 01/03/2013   Allergic rhinitis 01/03/2013   Hypertension 06/17/2012   Hyperlipidemia 06/17/2012   Testosterone  deficiency 06/17/2012       Review of Systems  Constitutional:  Negative for diaphoresis.  Eyes:  Negative for pain.  Respiratory:  Negative for shortness of breath.   Cardiovascular:  Negative for chest pain, palpitations and leg swelling.  Gastrointestinal:  Positive for abdominal pain (mild on left side when laying on that side). Negative for constipation, diarrhea, nausea and vomiting.  Endocrine: Negative for  polydipsia.  Skin:  Negative for rash.  Neurological:  Negative for dizziness, weakness and headaches.  Hematological:  Does not bruise/bleed easily.  All other systems reviewed and are negative.      Objective:   Physical Exam Constitutional:      Appearance: Normal appearance.  HENT:     Right Ear: Tympanic membrane normal.     Left Ear: Tympanic membrane normal.     Nose: No congestion or rhinorrhea.     Mouth/Throat:     Comments: White film on tongue Cardiovascular:     Rate and Rhythm: Normal rate and regular rhythm.     Heart sounds: Normal heart sounds.  Pulmonary:     Effort: Pulmonary effort is normal.     Breath sounds: Normal breath sounds.  Abdominal:     Tenderness: There is no abdominal tenderness. There is no guarding or rebound.  Lymphadenopathy:     Cervical: Cervical adenopathy (right anterior cervical) present.  Skin:    General: Skin is warm.  Neurological:     General: No focal deficit present.     Mental Status: He is alert and oriented to person, place, and time.  Psychiatric:        Mood and Affect: Mood normal.        Behavior: Behavior normal.    BP 132/85   Pulse 74   Temp 98 F (36.7 C) (Temporal)   Ht 5' 8 (1.727 m)   Wt 188 lb (85.3 kg)   SpO2  96%   BMI 28.59 kg/m         Assessment & Plan:   ALDO SONDGEROTH in today with chief complaint of neck sore and painful   1. Anterior cervical lymphadenopathy (Primary) Will talk once results are back from U/S - US  Soft Tissue Head/Neck (NON-THYROID ); Future  2. Oral yeast infection Swish and swallow meds - nystatin (MYCOSTATIN) 100000 UNIT/ML suspension; Take 5 mLs (500,000 Units total) by mouth 4 (four) times daily.  Dispense: 60 mL; Refill: 0  3. Diverticulosis Watch diet-  To ED if pain returns  4. Acute strain of neck muscle, initial encounter Moist heat Motrin or tylenol  OTC   The above assessment and management plan was discussed with the patient. The patient  verbalized understanding of and has agreed to the management plan. Patient is aware to call the clinic if symptoms persist or worsen. Patient is aware when to return to the clinic for a follow-up visit. Patient educated on when it is appropriate to go to the emergency department.   Mary-Margaret Gladis, FNP

## 2024-02-04 ENCOUNTER — Ambulatory Visit (HOSPITAL_COMMUNITY): Admission: RE | Admit: 2024-02-04

## 2024-02-04 DIAGNOSIS — R590 Localized enlarged lymph nodes: Secondary | ICD-10-CM | POA: Insufficient documentation

## 2024-02-12 ENCOUNTER — Ambulatory Visit: Payer: Self-pay | Admitting: Nurse Practitioner

## 2024-02-12 DIAGNOSIS — K118 Other diseases of salivary glands: Secondary | ICD-10-CM

## 2024-02-19 ENCOUNTER — Encounter: Payer: Self-pay | Admitting: Nurse Practitioner

## 2024-02-19 ENCOUNTER — Ambulatory Visit (INDEPENDENT_AMBULATORY_CARE_PROVIDER_SITE_OTHER): Payer: Self-pay | Admitting: Nurse Practitioner

## 2024-02-19 VITALS — BP 133/79 | HR 82 | Temp 97.7°F | Ht 68.0 in | Wt 194.0 lb

## 2024-02-19 DIAGNOSIS — E559 Vitamin D deficiency, unspecified: Secondary | ICD-10-CM

## 2024-02-19 DIAGNOSIS — E349 Endocrine disorder, unspecified: Secondary | ICD-10-CM

## 2024-02-19 DIAGNOSIS — E782 Mixed hyperlipidemia: Secondary | ICD-10-CM | POA: Diagnosis not present

## 2024-02-19 DIAGNOSIS — K5730 Diverticulosis of large intestine without perforation or abscess without bleeding: Secondary | ICD-10-CM

## 2024-02-19 DIAGNOSIS — Z23 Encounter for immunization: Secondary | ICD-10-CM

## 2024-02-19 DIAGNOSIS — I10 Essential (primary) hypertension: Secondary | ICD-10-CM

## 2024-02-19 DIAGNOSIS — N5201 Erectile dysfunction due to arterial insufficiency: Secondary | ICD-10-CM

## 2024-02-19 DIAGNOSIS — Z6828 Body mass index (BMI) 28.0-28.9, adult: Secondary | ICD-10-CM

## 2024-02-19 DIAGNOSIS — N400 Enlarged prostate without lower urinary tract symptoms: Secondary | ICD-10-CM

## 2024-02-19 LAB — CBC WITH DIFFERENTIAL/PLATELET
Basophils Absolute: 0 x10E3/uL (ref 0.0–0.2)
Basos: 1 %
EOS (ABSOLUTE): 0.2 x10E3/uL (ref 0.0–0.4)
Eos: 4 %
Hematocrit: 45.3 % (ref 37.5–51.0)
Hemoglobin: 15.4 g/dL (ref 13.0–17.7)
Immature Grans (Abs): 0 x10E3/uL (ref 0.0–0.1)
Immature Granulocytes: 0 %
Lymphocytes Absolute: 2.4 x10E3/uL (ref 0.7–3.1)
Lymphs: 35 %
MCH: 33.2 pg — ABNORMAL HIGH (ref 26.6–33.0)
MCHC: 34 g/dL (ref 31.5–35.7)
MCV: 98 fL — ABNORMAL HIGH (ref 79–97)
Monocytes Absolute: 0.6 x10E3/uL (ref 0.1–0.9)
Monocytes: 8 %
Neutrophils Absolute: 3.6 x10E3/uL (ref 1.4–7.0)
Neutrophils: 52 %
Platelets: 286 x10E3/uL (ref 150–450)
RBC: 4.64 x10E6/uL (ref 4.14–5.80)
RDW: 12.3 % (ref 11.6–15.4)
WBC: 6.9 x10E3/uL (ref 3.4–10.8)

## 2024-02-19 LAB — LIPID PANEL
Chol/HDL Ratio: 2.6 ratio (ref 0.0–5.0)
Cholesterol, Total: 150 mg/dL (ref 100–199)
HDL: 58 mg/dL
LDL Chol Calc (NIH): 75 mg/dL (ref 0–99)
Triglycerides: 90 mg/dL (ref 0–149)
VLDL Cholesterol Cal: 17 mg/dL (ref 5–40)

## 2024-02-19 LAB — CMP14+EGFR
ALT: 30 IU/L (ref 0–44)
AST: 26 IU/L (ref 0–40)
Albumin: 4.3 g/dL (ref 3.9–4.9)
Alkaline Phosphatase: 105 IU/L (ref 47–123)
BUN/Creatinine Ratio: 13 (ref 10–24)
BUN: 13 mg/dL (ref 8–27)
Bilirubin Total: 0.4 mg/dL (ref 0.0–1.2)
CO2: 23 mmol/L (ref 20–29)
Calcium: 9.9 mg/dL (ref 8.6–10.2)
Chloride: 100 mmol/L (ref 96–106)
Creatinine, Ser: 1 mg/dL (ref 0.76–1.27)
Globulin, Total: 2.6 g/dL (ref 1.5–4.5)
Glucose: 88 mg/dL (ref 70–99)
Potassium: 5.2 mmol/L (ref 3.5–5.2)
Sodium: 137 mmol/L (ref 134–144)
Total Protein: 6.9 g/dL (ref 6.0–8.5)
eGFR: 81 mL/min/1.73

## 2024-02-19 MED ORDER — VITAMIN D (ERGOCALCIFEROL) 1.25 MG (50000 UNIT) PO CAPS: 50000.0000 [IU] | ORAL_CAPSULE | ORAL | 1 refills | Status: AC

## 2024-02-19 MED ORDER — LISINOPRIL-HYDROCHLOROTHIAZIDE 10-12.5 MG PO TABS: 1.0000 | ORAL_TABLET | Freq: Every day | ORAL | 1 refills | Status: AC

## 2024-02-19 MED ORDER — TADALAFIL 20 MG PO TABS: 10.0000 mg | ORAL_TABLET | ORAL | 11 refills | Status: AC | PRN

## 2024-02-19 NOTE — Progress Notes (Signed)
 "  Subjective:    Patient ID: Carl Mccullough, male    DOB: 1955/11/11, 69 y.o.   MRN: [id number]   Chief Complaint: medical management of chronic issues     HPI:  Carl Mccullough is a 69 y.o. who identifies as a male who was assigned male at birth.   Social history: Lives with: wife Work history: retired last October   Comes in today for follow up of the following chronic medical issues:  1. Primary hypertension No c/o chest pain, sob or headache. Does not check blood pressure at home. BP Readings from Last 3 Encounters:  01/28/24 132/85  12/30/23 136/80  10/06/23 127/81     2. Mixed hyperlipidemia Does watch diet and stays very active. No dedicated exercise Lab Results  Component Value Date   CHOL 172 08/20/2023   HDL 59 08/20/2023   LDLCALC 96 08/20/2023   TRIG 94 08/20/2023   CHOLHDL 2.9 08/20/2023   The 10-year ASCVD risk score (Arnett DK, et al., 2019) is: 19%    3. Diverticulosis of colon without hemorrhage Had flare up in November.  4. Testosterone  deficiency Is on no testosterone  replacement Lab Results  Component Value Date   TESTOSTERONE  379 08/19/2022    5. Vitamin D  deficiency No vitamind replacement Last vitamin D  Lab Results  Component Value Date   VD25OH 58.1 08/19/2022     6. Benign prostatic hyperplasia without lower urinary tract symptoms No voiding issues Lab Results  Component Value Date   PSA1 1.4 02/26/2023   PSA1 1.3 02/18/2022   PSA1 1.0 02/08/2021   PSA 0.9 09/29/2013      7. Erectile dysfunction Does not take anything currently  8. BMI 28.0-28.9,adult Weight is up 6lbs  Wt Readings from Last 3 Encounters:  02/19/24 194 lb (88 kg)  01/28/24 188 lb (85.3 kg)  12/30/23 194 lb (88 kg)    BMI Readings from Last 3 Encounters:  02/19/24 29.50 kg/m  01/28/24 28.59 kg/m  12/30/23 29.50 kg/m          New complaints: None today  No Known Allergies Outpatient Encounter Medications as of 02/19/2024   Medication Sig   cetirizine  (ZYRTEC ) 10 MG tablet Take 1 tablet (10 mg total) by mouth daily. (Patient not taking: Reported on 01/28/2024)   fluticasone  (FLONASE ) 50 MCG/ACT nasal spray Place 2 sprays into both nostrils daily. (Patient not taking: Reported on 01/28/2024)   lisinopril -hydrochlorothiazide  (ZESTORETIC ) 10-12.5 MG tablet Take 1 tablet by mouth daily.   meloxicam  (MOBIC ) 15 MG tablet TAKE 1 TABLET (15 MG TOTAL) BY MOUTH DAILY.   nystatin  (MYCOSTATIN ) 100000 UNIT/ML suspension Take 5 mLs (500,000 Units total) by mouth 4 (four) times daily.   tadalafil  (CIALIS ) 20 MG tablet Take 0.5-1 tablets (10-20 mg total) by mouth every other day as needed for erectile dysfunction.   valACYclovir  (VALTREX ) 1000 MG tablet 2 po bid for 1 day at fever blister onset   Vitamin D , Ergocalciferol , (DRISDOL ) 1.25 MG (50000 UNIT) CAPS capsule Take 1 capsule (50,000 Units total) by mouth every 7 (seven) days.   Facility-Administered Encounter Medications as of 02/19/2024  Medication   methylPREDNISolone  acetate (DEPO-MEDROL ) injection 80 mg    Past Surgical History:  Procedure Laterality Date   CYSTOSCOPY  [id number]   INGUINAL HERNIA REPAIR N/A 06/02/2019   Procedure: LAPAROSCOPIC BILATERAL INGUINAL HERNIA REPAIR, PRIMARY UMBILICAL HERNIA REPAIR;  Surgeon: Sheldon Standing, MD;  Location: [facility];  Service: General;  Laterality: N/A;  GENERAL WITH ERAS  neck stitches  yrs ago   cut neck with chainsaw   TONSILLECTOMY      Family History  Problem Relation Age of Onset   Hypertension Mother    Alzheimer's disease Mother    Chronic Renal Failure Mother    Colon cancer Father    Bladder Cancer Father    Hypertension Brother    Rectal cancer Neg Hx    Stomach cancer Neg Hx    Esophageal cancer Neg Hx    Colon polyps Neg Hx       Controlled substance contract: n/a      Review of Systems  Constitutional:  Negative for diaphoresis.  Eyes:  Negative for pain.  Respiratory:   Negative for shortness of breath.   Cardiovascular:  Negative for chest pain, palpitations and leg swelling.  Gastrointestinal:  Negative for abdominal pain.  Endocrine: Negative for polydipsia.  Skin:  Negative for rash.  Neurological:  Negative for dizziness, weakness and headaches.  Hematological:  Does not bruise/bleed easily.  All other systems reviewed and are negative.      Objective:   Physical Exam Vitals and nursing note reviewed.  Constitutional:      Appearance: Normal appearance. He is well-developed.  HENT:     Head: Normocephalic.     Nose: Nose normal.     Mouth/Throat:     Mouth: Mucous membranes are moist.     Pharynx: Oropharynx is clear.  Eyes:     Pupils: Pupils are equal, round, and reactive to light.  Neck:     Thyroid : No thyroid  mass or thyromegaly.     Vascular: No carotid bruit or JVD.     Trachea: Phonation normal.     Comments: Palpable nodule right side of neck just below ear lobe. Non tender. Cardiovascular:     Rate and Rhythm: Normal rate and regular rhythm.  Pulmonary:     Effort: Pulmonary effort is normal. No respiratory distress.     Breath sounds: Normal breath sounds.  Abdominal:     General: Bowel sounds are normal.     Palpations: Abdomen is soft.     Tenderness: There is no abdominal tenderness.  Musculoskeletal:        General: Normal range of motion.     Cervical back: Normal range of motion and neck supple.  Lymphadenopathy:     Cervical: No cervical adenopathy.  Skin:    General: Skin is warm and dry.  Neurological:     Mental Status: He is alert and oriented to person, place, and time.  Psychiatric:        Behavior: Behavior normal.        Thought Content: Thought content normal.        Judgment: Judgment normal.    BP 133/79   Pulse 82   Temp 97.7 F (36.5 C) (Temporal)   Ht 5' 8 (1.727 m)   Wt 194 lb (88 kg)   SpO2 98%   BMI 29.50 kg/m            Assessment & Plan:  Carl Mccullough comes in today  with chief complaint of No chief complaint on file.   Diagnosis and orders addressed:  1. Primary hypertension (Primary) Low sodium diet - lisinopril -hydrochlorothiazide  (ZESTORETIC ) 10-12.5 MG tablet; Take 1 tablet by mouth daily.  Dispense: 90 tablet; Refill: 1 - CBC with Differential/Platelet - CMP14+EGFR  2. Mixed hyperlipidemia Low fta diet - Lipid panel  3. Diverticulosis of colon without hemorrhage  4. Benign prostatic hyperplasia without lower urinary tract symptoms Report any voiding issues - PSA, total and free  5. Erectile dysfunction due to arterial insufficiency - tadalafil  (CIALIS ) 20 MG tablet; Take 0.5-1 tablets (10-20 mg total) by mouth every other day as needed for erectile dysfunction.  Dispense: 5 tablet; Refill: 11  6. Testosterone  deficiency Last labs were normal  7. Vitamin D  deficiency Continue vitamin d  supplement  8. BMI 28.0-28.9,adult Discussed diet and exercise for person with BMI >25 Will recheck weight in 3-6 months  9. Parotid gland nodule Referral to ENT- call them back to make appointment  Labs pending Health Maintenance reviewed Diet and exercise encouraged  Follow up plan: 6 months   Mary-Margaret Gladis, FNP  "

## 2024-02-19 NOTE — Addendum Note (Signed)
 Addended by: GLADIS MUSTARD on: 02/19/2024 09:44 AM   Modules accepted: Orders, Level of Service

## 2024-02-19 NOTE — Patient Instructions (Signed)
 Parotitis  Parotitis means that you have irritation and swelling (inflammation) in one or both of your parotid glands. These glands make saliva. They are found on each side of your face, below and in front of your earlobes. You may or may not have pain with this condition. What are the causes? This condition may be caused by: Infections from germs (bacteria or viruses). Something blocking the flow of saliva through the parotid glands. This can be a stone, scar tissue, or a tumor. Diseases that cause your body's defense system (immune system) to attack healthy cells in your salivary glands. These are called autoimmune diseases. What increases the risk? Being 75 years old or older. Not drinking enough fluids (being dehydrated). Drinking too much alcohol. Having: A dry mouth. Diabetes. Gout. A long-term illness. Not taking good care of your mouth and teeth (poor oral hygiene). Having had radiation treatments to the head and neck. Taking certain medicines. What are the signs or symptoms? Swelling under and in front of the ear. This may get worse after you eat. Pain and tenderness over the parotid gland. This may get worse after you eat. Redness and warmth of the skin over the parotid gland. Fever or chills. Pus coming from the ducts inside the mouth. Dry mouth. A bad taste in the mouth. How is this treated? Treatment depends on the cause. It may include: Antibiotic medicine for an infection from bacteria. NSAIDs, such as ibuprofen, to treat pain and swelling. Drinking more fluids. Removing a stone or obstruction. Treating a disease that is causing parotitis. Surgery to drain an infection, remove a growth, or remove the whole gland. Treatment may not be needed if the swelling goes away with home care. Follow these instructions at home: Medicines  Take over-the-counter and prescription medicines only as told by your doctor. If you were prescribed an antibiotic medicine, take it as  told by your doctor. Do not stop taking it even if you start to feel better. Managing pain and swelling If told, put heat on the affected area. Do this as often as told by your doctor. Use the heat source that your doctor recommends, such as a moist heat pack or a heating pad. Place a towel between your skin and the heat source. Leave the heat on for 20-30 minutes. Take off the heat if your skin turns bright red. This is very important. If you cannot feel pain, heat, or cold, you have a greater risk of getting burned. Gargle with salt water 3-4 times a day or as needed. To make salt water, dissolve -1 tsp (3-6 g) of salt in 1 cup (237 mL) of warm water. Gently rub your parotid glands as told by your doctor. General instructions  Drink enough fluid to keep your pee (urine) pale yellow. Keep your mouth clean and moist. Suck on sour candy. This may help to: Make your mouth less dry. Make more saliva. Take good care of your mouth: Brush your teeth at least two times a day. Floss your teeth every day. See your dentist regularly. Do not smoke or use any products that contain nicotine or tobacco. If you need help quitting, ask your doctor. Do not drink alcohol. Keep all follow-up visits. Contact a doctor if: You have a fever or chills. You have new symptoms. Your symptoms get worse. Your symptoms do not get better with treatment. Get help right away if: You have trouble breathing or swallowing. These symptoms may be an emergency. Get help right away. Call your  local emergency services (911 in the [place] Do not wait to see if the symptoms will go away. Do not drive yourself to the hospital. Summary Parotitis means that you have irritation and swelling (inflammation) in one or both of your parotid glands. Symptoms include pain and swelling under and in front of the ear. Treatment for parotitis depends on the cause. In some cases, the condition may go away on its own with home care. You  should drink plenty of fluids, take good care of your mouth, and do not use products that contain nicotine or tobacco. This information is not intended to replace advice given to you by your health care provider. Make sure you discuss any questions you have with your health care provider. Document Revised: 06/15/2020 Document Reviewed: 06/15/2020 Elsevier Patient Education  2024 [employer].

## 2024-02-22 ENCOUNTER — Ambulatory Visit: Admitting: Physician Assistant

## 2024-02-22 ENCOUNTER — Ambulatory Visit: Payer: Self-pay | Admitting: Nurse Practitioner

## 2024-03-04 ENCOUNTER — Telehealth: Payer: Self-pay | Admitting: Nurse Practitioner

## 2024-03-04 DIAGNOSIS — H9193 Unspecified hearing loss, bilateral: Secondary | ICD-10-CM

## 2024-03-04 NOTE — Telephone Encounter (Signed)
 R/c to pt he is asking for a referral for audiology hearing test. He wants to go to [facility] Address: [address], [place], [place] [address] [phone] Please order and thank you. Copied from CRM [id number]. Topic: General - Other  >> Mar 04, 2024  3:44 PM Travis F wrote: Reason for CRM: Patient is calling in requesting to speak with someone who handles referrals. Patient is requesting a callback.

## 2024-03-10 ENCOUNTER — Encounter (INDEPENDENT_AMBULATORY_CARE_PROVIDER_SITE_OTHER): Payer: Self-pay | Admitting: Otolaryngology

## 2024-03-10 ENCOUNTER — Ambulatory Visit (INDEPENDENT_AMBULATORY_CARE_PROVIDER_SITE_OTHER): Admitting: Otolaryngology

## 2024-03-10 VITALS — BP 137/84 | HR 93 | Ht 68.0 in | Wt 194.0 lb

## 2024-03-10 DIAGNOSIS — K118 Other diseases of salivary glands: Secondary | ICD-10-CM | POA: Diagnosis not present

## 2024-03-10 NOTE — Progress Notes (Signed)
 Dear Dr. Gladis, Here is my assessment for our mutual patient, Carl Mccullough. Thank you for allowing me the opportunity to care for your patient. Please do not hesitate to contact me should you have any other questions. Sincerely, Dr. Eldora Blanch  Otolaryngology Clinic Note Referring provider: Dr. Gladis HPI:  Carl Mccullough is a 69 y.o. male kindly referred by Dr. Gladis for evaluation of parotid mass  Initial visit (02/2024): Discussed the use of AI scribe software for clinical note transcription with the patient, who gave verbal consent to proceed.  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  He noticed a palpable mass near the right jaw in early December 2025. The mass has remained stable in size and character and is painless. He has no other neck lumps and no prior neck surgery aside from a remote sutured chainsaw laceration. He has no facial numbness, facial weakness, or weight loss. He has no history of skin cancer or facial/neck skin procedures  He has no fever, recent illness, malaise, or otalgia, and has chronic tinnitus only. No other ear complaints  No biopsies of the lesion  Patient otherwise denies: -unintentional weight loss - changes in voice, shortness of breath - ear pain, other neck masses  ENT Surgery: denies -- laceration repair neck Personal or FHx of bleeding dz or anesthesia difficulty: no  GLP-1: no AP/AC: no  Tobacco: no  PMHx: HTN, HLD, Back Pain, BPH  Independent Review of Additional Tests or Records:  Mary-Margaret Gladis (01/28/2024): noted knot at base of right ear, incidentally noted. No change in size; Dx: Neck Mass; Rx: US , and then ref to ENT CBC and CMP 02/19/2024: WBC 6.9, Hgb and Plt wnl; BUN?Cr 13/1.0 US  Neck 02/04/2024 independently interpreted: 2x1 cm right parotid lesion; small cervical LN ~10mm PMH/Meds/All/SocHx/FamHx/ROS:   Past Medical History:   Diagnosis Date   Adenomatous colon polyp    Benign prostatic hyperplasia    DDD (degenerative disc disease), lumbosacral    Diverticulitis    History of hypertension off bp meds since 2018 large weight loss   Hyperlipidemia    Inguinal hernia bilateral, non-recurrent    Internal hemorrhoids    Vitamin D  deficiency      Past Surgical History:  Procedure Laterality Date   CYSTOSCOPY  [id number]   INGUINAL HERNIA REPAIR N/A 06/02/2019   Procedure: LAPAROSCOPIC BILATERAL INGUINAL HERNIA REPAIR, PRIMARY UMBILICAL HERNIA REPAIR;  Surgeon: Sheldon Standing, MD;  Location: [facility];  Service: General;  Laterality: N/A;  GENERAL WITH ERAS   neck stitches  yrs ago   cut neck with chainsaw   TONSILLECTOMY      Family History  Problem Relation Age of Onset   Hypertension Mother    Alzheimer's disease Mother    Chronic Renal Failure Mother    Colon cancer Father    Bladder Cancer Father    Hypertension Brother    Rectal cancer Neg Hx    Stomach cancer Neg Hx    Esophageal cancer Neg Hx    Colon polyps Neg Hx      Social Connections: Moderately Integrated (09/02/2023)   Social Connection and Isolation Panel    Frequency of Communication with Friends and Family: More than three times a week    Frequency of Social Gatherings with Friends and Family: More than three times a week    Attends Religious Services: More than 4 times per year  Active Member of Clubs or Organizations: No    Attends Banker Meetings: Never    Marital Status: Married     Current Medications[1]   Physical Exam:   BP 137/84 (BP Location: Right Arm, Patient Position: Sitting, Cuff Size: Large)   Pulse 93   Ht 5' 8 (1.727 m)   Wt 194 lb (88 kg)   SpO2 93%   BMI 29.50 kg/m   Salient findings:  CN II-XII intact Bilateral EAC clear and TM intact with well pneumatized middle ear spaces; HA in place Anterior rhinoscopy: Septum intact; bilateral inferior turbinates without  significant hypertrophy No lesions of oral cavity/oropharynx No obviously palpable neck masses/lymphadenopathy/thyromegaly except small right tail of parotid lesion, mobile ~1x2 cm No respiratory distress or stridor No obvious lesions suggestive of skin cancer but there is a small right postauricular skin erythematous lesion ~52mm and superior helix lesion (keratoacanthoma?)  Seprately Identifiable Procedures:  Prior to initiating any procedures, risks/benefits/alternatives were explained to the patient and verbal consent obtained. None  Impression & Plans:  [name] is a 69 y.o. male with:  1. Parotid mass    We discussed DDX which includes benign and malignant etiologies; discussed options including FNA and CT neck Will begin with FNA; f/u in 6 weeks to discuss, sooner if any issues  See below regarding exact medications prescribed this encounter including dosages and route: No orders of the defined types were placed in this encounter.     Thank you for allowing me the opportunity to care for your patient. Please do not hesitate to contact me should you have any other questions.  Sincerely, Eldora Blanch, MD Otolaryngologist (ENT), [facility] Phone: [phone] Fax: [phone]  03/10/2024, 3:12 PM   MDM:  [phone] Complexity/Problems addressed: mod - now chronic problem, unknown diagnosis and prognosis needing further testing Data complexity: mod - independent interpretation of imaging; review of note, labs - Morbidity: low  - Prescription Drug prescribed or managed: n      [1]  Current Outpatient Medications:    lisinopril -hydrochlorothiazide  (ZESTORETIC ) 10-12.5 MG tablet, Take 1 tablet by mouth daily., Disp: 90 tablet, Rfl: 1   meloxicam  (MOBIC ) 15 MG tablet, TAKE 1 TABLET (15 MG TOTAL) BY MOUTH DAILY., Disp: 90 tablet, Rfl: 1   tadalafil  (CIALIS ) 20 MG tablet, Take 0.5-1 tablets (10-20 mg total) by mouth every other day as needed for erectile  dysfunction., Disp: 5 tablet, Rfl: 11   Vitamin D , Ergocalciferol , (DRISDOL ) 1.25 MG (50000 UNIT) CAPS capsule, Take 1 capsule (50,000 Units total) by mouth every 7 (seven) days., Disp: 12 capsule, Rfl: 1   valACYclovir  (VALTREX ) 1000 MG tablet, 2 po bid for 1 day at fever blister onset (Patient not taking: Reported on 03/10/2024), Disp: 20 tablet, Rfl: 0  Current Facility-Administered Medications:    methylPREDNISolone  acetate (DEPO-MEDROL ) injection 80 mg, 80 mg, Intramuscular, Once, Gladis Mustard, FNP

## 2024-03-10 NOTE — Patient Instructions (Signed)
 I have ordered a biopsy for you to complete prior to your next visit. Please call [facility] Scheduling at [phone] to schedule your imaging if you have not received a call within 24 hours

## 2024-03-11 ENCOUNTER — Encounter (HOSPITAL_COMMUNITY): Payer: Self-pay

## 2024-03-11 NOTE — Progress Notes (Signed)
 Jennefer Ester PARAS, MD  Lovada Barwick PROCEDURE / BIOPSY REVIEW Date: 03/11/24  Requested Biopsy site: Right parotid mass Reason for request: parotid mass Imaging review: Best seen on US  neck 02/04/24  Decision: Approved Imaging modality to perform: Ultrasound Schedule with: No sedation / Local anesthetic Schedule for: Any VIR  Additional comments: none  Please contact me with questions, concerns, or if issue pertaining to this request arise.  Ester PARAS Jennefer, MD [facility]       Previous Messages    ----- Message ----- From: Caria Transue Sent: 03/11/2024   8:35 AM EST To: Ir Procedure Requests Subject: FW: US  FNA BIOPSY SALIVARY GLAND PAROTID GLA*   ----- Message ----- From: Peityn Payton Sent: 03/11/2024   8:24 AM EST To: Manford Sprong; Ir Procedure Requests Subject: US  FNA BIOPSY SALIVARY GLAND PAROTID GLAND E*  Procedure : US  FNA BIOPSY SALIVARY GLAND PAROTID GLAND EA ADDT'L LESION  Reason : right parotid mass Dx: Parotid mass [K11.8 (ICD-10-CM)]    History : US  soft tissue head and neck , CT and pelv w/  Provider : Tobie Eldora NOVAK, MD  Contact : [phone]

## 2024-03-23 ENCOUNTER — Ambulatory Visit: Admitting: Gastroenterology

## 2024-03-25 ENCOUNTER — Telehealth: Payer: Self-pay | Admitting: Nurse Practitioner

## 2024-03-25 MED ORDER — CIPROFLOXACIN HCL 500 MG PO TABS: 500.0000 mg | ORAL_TABLET | Freq: Two times a day (BID) | ORAL | 0 refills | Status: AC

## 2024-03-25 MED ORDER — METRONIDAZOLE 500 MG PO TABS: 500.0000 mg | ORAL_TABLET | Freq: Two times a day (BID) | ORAL | 0 refills | Status: AC

## 2024-03-25 NOTE — Telephone Encounter (Signed)
 Patient Is having a flare up of his diverticulitis. No openings to be seen today. The pain is in left lower quadrant exactly like when he has diverticuitis  Meds ordered this encounter  Medications   ciprofloxacin  (CIPRO ) 500 MG tablet    Sig: Take 1 tablet (500 mg total) by mouth 2 (two) times daily.    Dispense:  20 tablet    Refill:  0    Supervising Provider:   DETTINGER, JOSHUA A [[id number]]   metroNIDAZOLE  (FLAGYL ) 500 MG tablet    Sig: Take 1 tablet (500 mg total) by mouth 2 (two) times daily for 10 days.    Dispense:  20 tablet    Refill:  0    Supervising Provider:   MARYANNE CHEW A [[id number]]   Mary-Margaret Gladis, FNP

## 2024-03-30 ENCOUNTER — Ambulatory Visit (HOSPITAL_COMMUNITY)

## 2024-04-19 ENCOUNTER — Ambulatory Visit: Admitting: Gastroenterology

## 2024-04-21 ENCOUNTER — Ambulatory Visit (INDEPENDENT_AMBULATORY_CARE_PROVIDER_SITE_OTHER): Admitting: Otolaryngology

## 2024-08-18 ENCOUNTER — Ambulatory Visit: Admitting: Nurse Practitioner

## 2024-09-02 ENCOUNTER — Ambulatory Visit: Payer: Self-pay

## 2025-09-01 ENCOUNTER — Ambulatory Visit
